# Patient Record
Sex: Female | Born: 1945
Health system: Southern US, Community
[De-identification: ages and names within clinical notes are randomized; demographics above are authoritative.]

## PROBLEM LIST (undated history)

## (undated) DIAGNOSIS — K219 Gastro-esophageal reflux disease without esophagitis: Secondary | ICD-10-CM

## (undated) DIAGNOSIS — G4763 Sleep related bruxism: Secondary | ICD-10-CM

## (undated) DIAGNOSIS — F32A Depression, unspecified: Secondary | ICD-10-CM

## (undated) DIAGNOSIS — G4700 Insomnia, unspecified: Secondary | ICD-10-CM

## (undated) DIAGNOSIS — G4733 Obstructive sleep apnea (adult) (pediatric): Secondary | ICD-10-CM

## (undated) DIAGNOSIS — Z8249 Family history of ischemic heart disease and other diseases of the circulatory system: Secondary | ICD-10-CM

## (undated) DIAGNOSIS — E785 Hyperlipidemia, unspecified: Secondary | ICD-10-CM

## (undated) DIAGNOSIS — C55 Malignant neoplasm of uterus, part unspecified: Secondary | ICD-10-CM

## (undated) DIAGNOSIS — F329 Major depressive disorder, single episode, unspecified: Secondary | ICD-10-CM

## (undated) DIAGNOSIS — M1990 Unspecified osteoarthritis, unspecified site: Secondary | ICD-10-CM

## (undated) DIAGNOSIS — Z90710 Acquired absence of both cervix and uterus: Secondary | ICD-10-CM

## (undated) DIAGNOSIS — J40 Bronchitis, not specified as acute or chronic: Secondary | ICD-10-CM

## (undated) DIAGNOSIS — I10 Essential (primary) hypertension: Secondary | ICD-10-CM

## (undated) DIAGNOSIS — H1010 Acute atopic conjunctivitis, unspecified eye: Secondary | ICD-10-CM

## (undated) HISTORY — DX: Insomnia, unspecified: G47.00

## (undated) HISTORY — PX: ROTATOR CUFF REPAIR: SHX139

## (undated) HISTORY — DX: Hyperlipidemia, unspecified: E78.5

## (undated) HISTORY — DX: Bronchitis, not specified as acute or chronic: J40

## (undated) HISTORY — DX: Acute atopic conjunctivitis, unspecified eye: H10.10

## (undated) HISTORY — DX: Essential (primary) hypertension: I10

## (undated) HISTORY — DX: Acquired absence of both cervix and uterus: Z90.710

## (undated) HISTORY — PX: NASAL SEPTUM SURGERY: SHX37

## (undated) HISTORY — PX: TOTAL ABDOMINAL HYSTERECTOMY: SHX209

## (undated) HISTORY — PX: NM MYOVIEW LTD: HXRAD82

## (undated) HISTORY — DX: Family history of ischemic heart disease and other diseases of the circulatory system: Z82.49

## (undated) HISTORY — DX: Obstructive sleep apnea (adult) (pediatric): G47.33

## (undated) HISTORY — DX: Sleep related bruxism: G47.63

## (undated) HISTORY — PX: DOPPLER ECHOCARDIOGRAPHY: SHX263

## (undated) HISTORY — DX: Malignant neoplasm of uterus, part unspecified: C55

## (undated) HISTORY — DX: Gastro-esophageal reflux disease without esophagitis: K21.9

## (undated) HISTORY — PX: CARPAL TUNNEL RELEASE: SHX101

## (undated) HISTORY — PX: BILATERAL SALPINGOOPHORECTOMY: SHX1223

---

## 1997-10-29 DIAGNOSIS — C4491 Basal cell carcinoma of skin, unspecified: Secondary | ICD-10-CM

## 1997-10-29 HISTORY — DX: Basal cell carcinoma of skin, unspecified: C44.91

## 2001-08-01 ENCOUNTER — Encounter: Admission: RE | Admit: 2001-08-01 | Discharge: 2001-08-01 | Payer: Self-pay | Admitting: Obstetrics and Gynecology

## 2001-08-01 ENCOUNTER — Encounter: Payer: Self-pay | Admitting: Obstetrics and Gynecology

## 2001-08-05 ENCOUNTER — Encounter: Admission: RE | Admit: 2001-08-05 | Discharge: 2001-08-05 | Payer: Self-pay | Admitting: Obstetrics and Gynecology

## 2001-08-05 ENCOUNTER — Encounter: Payer: Self-pay | Admitting: Obstetrics and Gynecology

## 2001-10-16 ENCOUNTER — Encounter (INDEPENDENT_AMBULATORY_CARE_PROVIDER_SITE_OTHER): Payer: Self-pay | Admitting: *Deleted

## 2001-10-16 ENCOUNTER — Ambulatory Visit (HOSPITAL_COMMUNITY): Admission: RE | Admit: 2001-10-16 | Discharge: 2001-10-16 | Payer: Self-pay | Admitting: Gastroenterology

## 2003-04-07 ENCOUNTER — Ambulatory Visit (HOSPITAL_COMMUNITY): Admission: RE | Admit: 2003-04-07 | Discharge: 2003-04-07 | Payer: Self-pay | Admitting: Ophthalmology

## 2003-06-30 ENCOUNTER — Ambulatory Visit (HOSPITAL_COMMUNITY): Admission: RE | Admit: 2003-06-30 | Discharge: 2003-06-30 | Payer: Self-pay | Admitting: Internal Medicine

## 2004-11-02 ENCOUNTER — Ambulatory Visit (HOSPITAL_COMMUNITY): Admission: RE | Admit: 2004-11-02 | Discharge: 2004-11-02 | Payer: Self-pay | Admitting: Gastroenterology

## 2004-11-02 ENCOUNTER — Encounter (INDEPENDENT_AMBULATORY_CARE_PROVIDER_SITE_OTHER): Payer: Self-pay | Admitting: Specialist

## 2005-10-17 ENCOUNTER — Ambulatory Visit: Payer: Self-pay | Admitting: Pulmonary Disease

## 2006-04-06 ENCOUNTER — Ambulatory Visit: Payer: Self-pay | Admitting: Pulmonary Disease

## 2006-07-06 ENCOUNTER — Ambulatory Visit: Payer: Self-pay | Admitting: Pulmonary Disease

## 2006-08-15 ENCOUNTER — Ambulatory Visit: Payer: Self-pay | Admitting: Pulmonary Disease

## 2006-08-23 ENCOUNTER — Ambulatory Visit: Payer: Self-pay | Admitting: Internal Medicine

## 2006-09-26 ENCOUNTER — Ambulatory Visit: Payer: Self-pay | Admitting: Pulmonary Disease

## 2007-02-12 ENCOUNTER — Ambulatory Visit: Payer: Self-pay | Admitting: Pulmonary Disease

## 2007-02-22 ENCOUNTER — Ambulatory Visit: Payer: Self-pay | Admitting: Pulmonary Disease

## 2007-03-01 ENCOUNTER — Ambulatory Visit: Payer: Self-pay | Admitting: Cardiovascular Disease

## 2007-03-27 ENCOUNTER — Ambulatory Visit: Payer: Self-pay | Admitting: Pulmonary Disease

## 2007-05-16 ENCOUNTER — Ambulatory Visit: Payer: Self-pay | Admitting: Internal Medicine

## 2007-07-23 ENCOUNTER — Ambulatory Visit: Payer: Self-pay | Admitting: Internal Medicine

## 2007-11-19 DIAGNOSIS — J45901 Unspecified asthma with (acute) exacerbation: Secondary | ICD-10-CM | POA: Insufficient documentation

## 2007-11-19 DIAGNOSIS — K219 Gastro-esophageal reflux disease without esophagitis: Secondary | ICD-10-CM | POA: Insufficient documentation

## 2007-11-20 ENCOUNTER — Encounter: Payer: Self-pay | Admitting: Internal Medicine

## 2007-11-20 ENCOUNTER — Ambulatory Visit: Payer: Self-pay | Admitting: Internal Medicine

## 2008-05-20 ENCOUNTER — Ambulatory Visit: Payer: Self-pay | Admitting: Internal Medicine

## 2008-05-20 DIAGNOSIS — H1045 Other chronic allergic conjunctivitis: Secondary | ICD-10-CM

## 2009-05-19 ENCOUNTER — Encounter (HOSPITAL_COMMUNITY): Admission: RE | Admit: 2009-05-19 | Discharge: 2009-08-17 | Payer: Self-pay | Admitting: Cardiology

## 2009-05-20 ENCOUNTER — Ambulatory Visit: Payer: Self-pay | Admitting: Internal Medicine

## 2009-05-20 DIAGNOSIS — E1169 Type 2 diabetes mellitus with other specified complication: Secondary | ICD-10-CM | POA: Insufficient documentation

## 2009-05-20 DIAGNOSIS — I10 Essential (primary) hypertension: Secondary | ICD-10-CM

## 2009-05-20 DIAGNOSIS — E785 Hyperlipidemia, unspecified: Secondary | ICD-10-CM

## 2009-08-18 ENCOUNTER — Encounter (HOSPITAL_COMMUNITY): Admission: RE | Admit: 2009-08-18 | Discharge: 2009-11-16 | Payer: Self-pay | Admitting: Cardiology

## 2009-11-17 ENCOUNTER — Encounter (HOSPITAL_COMMUNITY): Admission: RE | Admit: 2009-11-17 | Discharge: 2010-02-15 | Payer: Self-pay | Admitting: Cardiology

## 2010-02-16 ENCOUNTER — Encounter (HOSPITAL_COMMUNITY): Admission: RE | Admit: 2010-02-16 | Discharge: 2010-05-17 | Payer: Self-pay | Admitting: Cardiovascular Disease

## 2010-04-21 ENCOUNTER — Ambulatory Visit: Payer: Self-pay | Admitting: Internal Medicine

## 2010-04-21 DIAGNOSIS — J00 Acute nasopharyngitis [common cold]: Secondary | ICD-10-CM | POA: Insufficient documentation

## 2010-04-26 ENCOUNTER — Telehealth (INDEPENDENT_AMBULATORY_CARE_PROVIDER_SITE_OTHER): Payer: Self-pay | Admitting: *Deleted

## 2010-04-27 ENCOUNTER — Ambulatory Visit: Payer: Self-pay | Admitting: Internal Medicine

## 2010-05-18 ENCOUNTER — Encounter (HOSPITAL_COMMUNITY): Admission: RE | Admit: 2010-05-18 | Discharge: 2010-08-16 | Payer: Self-pay | Admitting: Cardiovascular Disease

## 2010-05-27 ENCOUNTER — Encounter: Admission: RE | Admit: 2010-05-27 | Discharge: 2010-05-27 | Payer: Self-pay | Admitting: Otolaryngology

## 2010-06-13 ENCOUNTER — Ambulatory Visit: Payer: Self-pay | Admitting: Internal Medicine

## 2010-08-18 ENCOUNTER — Encounter (HOSPITAL_COMMUNITY)
Admission: RE | Admit: 2010-08-18 | Discharge: 2010-11-16 | Payer: Self-pay | Source: Home / Self Care | Admitting: Cardiovascular Disease

## 2010-10-11 ENCOUNTER — Ambulatory Visit: Payer: Self-pay | Admitting: Internal Medicine

## 2010-11-17 ENCOUNTER — Encounter (HOSPITAL_COMMUNITY)
Admission: RE | Admit: 2010-11-17 | Discharge: 2010-12-17 | Payer: Self-pay | Source: Home / Self Care | Admitting: Cardiovascular Disease

## 2010-11-25 ENCOUNTER — Ambulatory Visit: Payer: Self-pay | Admitting: Internal Medicine

## 2011-01-17 NOTE — Progress Notes (Signed)
Summary: wants to speak to [facility]  Phone Note Call from Patient Call back at Home Phone [phone] Call back at [phone]   Caller: Patient Call For: young Summary of Call: pt wants to come in for another nasal treatment Initial call taken by: Lacinda Axon,  Apr 26, 2010 12:19 PM  Follow-up for Phone Call        called and spoke with pt.  pt just saw CY on 04-21-2010.  pt states she took last dose of abx today.  pt states she started to feel better yesterday but today symptoms returned. pt c/o increased sob, feeling ti9red, head and chest congestion, coughing up white sputum.  Pt denied f/c/s.  Pt would like to come in for another nasal neb tx.  Please advise.  Thank you.  Aundra Millet Reynolds LPN  Apr 26, 2010 1:04 PM   ALLERGIES:  SULFA   Additional Follow-up for Phone Call Additional follow up Details #1::        Per CDY-ok to come in for nasal tx only; pt aware and will be here Wedensday at 930am.[facility] CMA  Apr 26, 2010 2:07 PM

## 2011-01-17 NOTE — Assessment & Plan Note (Signed)
Summary: rov 4 months///kp   Primary Provider/Referring Provider:  Larina Earthly  CC:  4 month follow up and no complaints .  History of Present Illness: May 22, 2009- Bronchitis,, conjunctivitis  Has done very well over past year. Dr Larae Grooms gave fluticasone which controls her nose. Uses Mucinex to thin mucus. In last 2 weeks has had scratchy eyes and increased head and chest congestion with little discharge. Chest not tight and she has not felt she needed Advair. Asks refill Patanol.  Apr 21, 2010- Bronchitis,, conjunctivitis  Tinnitus, head congestion, chest tightness, no energy, muscles ache some. Some cough. Nasal discharge is cloudy. This has been going on a week. Denies sick exposure. Has a long drive tomorrow and wants to feel better. No Gi upset. Prior to this she says she had done well over the past year, using Patanol eye drops and fluticasone nasal spray.  June 13, 2010- Bronchitis, Rhinits,  onjunctivitis Nurse-CC: 4 month follow up, no complaints.  She is feeling pretty well now. Says Mucinex and Sudafed usually takes care of her. Continues flonase. Patanase works well but too expensive. We discussed otc eye drops. Little or no chest discomfort.  Planning trip to [place] she wanted to discuss- taking daughter to see birth mother.      Preventive Screening-Counseling & Management  Alcohol-Tobacco     Smoking Status: quit     Packs/Day: 1.5     Year Started: 1965     Year Quit: 1980  Current Medications (verified): 1)  Cymbalta 60 Mg  Cpep (Duloxetine Hcl) .... Take 1 Capsule By Mouth Once A Day 2)  Patanol 0.1 %  Soln (Olopatadine Hcl) .[name].. 1 Drop in Each Eye At Bedtime As Needed 3)  Crestor 20 Mg Tabs (Rosuvastatin Calcium) .[name].. 1 Once Daily 4)  Bystolic 10 Mg Tabs (Nebivolol Hcl) .... Take 1 By Mouth Once Daily 5)  Vagifem 25 Mcg Tabs (Estradiol) .... Take  Twice Week 6)  Prilosec Otc 20 Mg Tbec (Omeprazole Magnesium) .[name].. 1 Once Daily 7)  Flonase 50 Mcg/act Susp  (Fluticasone Propionate) .... Once Daily 8)  Losartan Potassium 50 Mg Tabs (Losartan Potassium) .... Take 1 By Mouth Once Daily 9)  Viibryd 40 Mg Tabs (Vilazodone Hcl) .[name].. 1 Once Daily 10)  Losartan Potassium 100 Mg Tabs (Losartan Potassium) .... 1/2 Two Times A Day  Allergies (verified): 1)  ! Sulfa  Past History:  Past Medical History: Last updated: 2009/05/22 ALLERGIC CONJUNCTIVITIS (ICD-372.14) ESOPHAGEAL REFLUX (ICD-530.81) BRONCHITIS (ICD-490) Hx uterine cancer- hyusterectomy Hyperlipidemia Hypertension  Past Surgical History: Last updated: 05/22/2009 Nasal septoplasty T A H and B S O carpal tunnel bilaterally  Family History: Last updated: 2008-05-22 mother died at age 35 from pulmonary problems father died at age 7 from heart attack 2 siblings 1 sister alive age 71--hx of pulmonary problems 1 brother alive age 104   Social History: Last updated: 05-22-2008 single 1 child quit smoking 30 years ago no etoh use  Risk Factors: Smoking Status: quit (10/11/2010) Packs/Day: 1.5 (10/11/2010)  Social History: Smoking Status:  quit Packs/Day:  1.5  Review of Systems      See HPI       The patient complains of nasal congestion/difficulty breathing through nose and sneezing.  The patient denies shortness of breath with activity, shortness of breath at rest, productive cough, non-productive cough, coughing up blood, chest pain, irregular heartbeats, acid heartburn, indigestion, loss of appetite, weight change, abdominal pain, difficulty swallowing, sore throat, tooth/dental problems, headaches, itching, ear ache,  rash, change in color of mucus, and fever.    Vital Signs:  Patient profile:   65 year old female Height:      61 inches Weight:      159.2 pounds BMI:     30.19 O2 Sat:      99 % on Room air Pulse rate:   54 / minute BP sitting:   162 / 80  (left arm)  Vitals Entered By: Renold Genta RCP, LPN (October 11, 2010 9:00 AM)  O2 Flow:  Room air CC:  4 month follow up, no complaints  Is Patient Diabetic? No Comments Medications reviewed with patient Renold Genta RCP, LPN  October 11, 2010 9:00 AM    Physical Exam  Additional Exam:  General: A/Ox3; pleasant and cooperative, NAD, SKIN: no rash, lesions NODES: no lymphadenopathy HEENT: [place]/AT, EOM- WNL, Conjuctivae- clear, PERRLA, TM-WNL, Nose- stuffy, Throat- clear and wnl, Mallampati III NECK: Supple w/ fair ROM, JVD- none, normal carotid impulses w/o bruits Thyroid-  CHEST: Clear to P&A HEART: RRR, no m/g/r heard ABDOMEN: mild overweight HYQ:MVHQ, nl pulses, no edema NEURO: Grossly intact to observation      Impression & Recommendations:  Problem # 1:  RHINOCONJUNCTIVITIS (ICD-460)  Good symptomatic control for this time of year. We can leave her with current meds.  We discussed otc allergy eyedrops that will be easier to afford.   Orders: Est. Patient Level III (46962)  Problem # 2:  BRONCHITIS (ICD-490) Good control currently with no intervention to offer.   Medications Added to Medication List This Visit: 1)  Cymbalta 60 Mg Cpep (Duloxetine hcl) .... Take 1 capsule by mouth once a day 2)  Crestor 20 Mg Tabs (Rosuvastatin calcium) .[name].. 1 once daily 3)  Prilosec Otc 20 Mg Tbec (Omeprazole magnesium) .[name].. 1 once daily 4)  Viibryd 40 Mg Tabs (Vilazodone hcl) .[name].. 1 once daily 5)  Losartan Potassium 100 Mg Tabs (Losartan potassium) .... 1/2 two times a day  Patient Instructions: 1)  Please schedule a follow-up appointment in 1 year. 2)  Please call earlier if needed. 3)  Consider otc eye drops. Visine AC, [place] A, [place].

## 2011-01-17 NOTE — Assessment & Plan Note (Signed)
Summary: HEADACHE, SINUS CONGESTION, ? Bronchitis/apc   Primary Provider/Referring Provider:  Larina Earthly  CC:  Accute Visit-head congestion x 3 days/chest congestion;headache-voice going lower; pressure in head.[name]  History of Present Illness: ESOPHAGEAL REFLUX (ICD-530.81) BRONCHITIS (ICD-2) Tthis 65 year old woman returns for 6 month follow-up.  She has had recurrent bronchitis and thinks she has an acute bronchitis now. She has noted increased chest congestion, and some cough with little sputum.  No fever or significant dyspnea. Spring was good.  She does not feel she needs a rescue inhaler.  Patanol eye drops had been needed very rarely.  She had quit smoking 30 years ago.  She is aware of GERD, but feels it is controlled. Denies headache, sinus drainage, sneezing chest pain, dyspnea, n/v/d, weight loss, fever, edema.   05-27-09- Bronchitis, fluticasone, conjunctivitis  Has done very well over past year. Dr Larae Grooms gave fluticasone which controls her nose. Uses Mucinex to thin mucus. In last 2 weeks has had scratchy eyes and increased head and chest congestion with little discharge. Chest not tight and she has not felt she needed Advair. Asks refill Patanol.  Apr 21, 2010-  Tinnitus, head congestion, chest tightness, no energy, muscles ache some. Some cough. Nasal discharge is cloudy. This has been going on a week. Denies sick exposure. Has a long drive tomorrow and wants to feel better. No Gi upset. Prior to this she says she had done well over the past year, using Patanol eye drops and fluticasone nasal spray.  June 13, 2010- Bronchitis,  conjunctivitis After nasal neb and depo she felt well until she got tired x last few days and now again feels pressure fullness in head and chest with voice lower, frontal headache.    Preventive Screening-Counseling & Management  Alcohol-Tobacco     Smoking Status: quit > 6 months  Current Medications (verified): 1)  Cymbalta 60 Mg  Cpep  (Duloxetine Hcl) .... Take 1 Capsule By Mouth Once A Day 2)  Patanol 0.1 %  Soln (Olopatadine Hcl) .[name].. 1 Drop in Each Eye At Bedtime As Needed 3)  Crestor 10 Mg  Tabs (Rosuvastatin Calcium) .... Take 1 By Mouth Once Daily 4)  Bystolic 10 Mg Tabs (Nebivolol Hcl) .... Take 1 By Mouth Once Daily 5)  Vagifem 25 Mcg Tabs (Estradiol) .... Take  Twice Week 6)  Prilosec ?mg .... Take 1 By Mouth 7)  Flonase 50 Mcg/act Susp (Fluticasone Propionate) .... Once Daily 8)  Losartan Potassium 50 Mg Tabs (Losartan Potassium) .... Take 1 By Mouth Once Daily  Allergies (verified): 1)  ! Sulfa  Past History:  Past Medical History: Last updated: 27-May-2009 ALLERGIC CONJUNCTIVITIS (ICD-372.14) ESOPHAGEAL REFLUX (ICD-530.81) BRONCHITIS (ICD-490) Hx uterine cancer- hyusterectomy Hyperlipidemia Hypertension  Past Surgical History: Last updated: May 27, 2009 Nasal septoplasty T A H and B S O carpal tunnel bilaterally  Family History: Last updated: May 27, 2008 mother died at age 86 from pulmonary problems father died at age 67 from heart attack 2 siblings 1 sister alive age 67--hx of pulmonary problems 1 brother alive age 62   Social History: Last updated: May 27, 2008 single 1 child quit smoking 30 years ago no etoh use  Risk Factors: Smoking Status: quit > 6 months (06/13/2010)  Social History: Smoking Status:  quit > 6 months  Review of Systems      See HPI       The patient complains of headaches, nasal congestion/difficulty breathing through nose, and sneezing.  The patient denies shortness of breath with activity, shortness of  breath at rest, productive cough, non-productive cough, coughing up blood, chest pain, irregular heartbeats, acid heartburn, indigestion, loss of appetite, weight change, abdominal pain, difficulty swallowing, sore throat, and tooth/dental problems.    Vital Signs:  Patient profile:   65 year old female Height:      61 inches Weight:      150 pounds BMI:      28.44 O2 Sat:      97 % on Room air Pulse rate:   71 / minute BP sitting:   118 / 74  (left arm) Cuff size:   regular  Vitals Entered By: Reynaldo Minium CMA (June 13, 2010 10:54 AM)  O2 Flow:  Room air  Physical Exam  Additional Exam:  General: A/Ox3; pleasant and cooperative, NAD, SKIN: no rash, lesions NODES: no lymphadenopathy HEENT: [place]/AT, EOM- WNL, Conjuctivae- clear, PERRLA, TM-WNL, Nose- stuffy, Throat- clear and wnl, Mallampati IV NECK: Supple w/ fair ROM, JVD- none, normal carotid impulses w/o bruits Thyroid-  CHEST: Clear to P&A HEART: RRR, no m/g/r heard ABDOMEN:  EAV:WUJW, nl pulses, no edema NEURO: Grossly intact to observation      Impression & Recommendations:  Problem # 1:  RHINOCONJUNCTIVITIS (ICD-460)  Mucosa is more pale than expected and there may be an allergy component. For now I will  try a neb/ depo at lower dose, then see if she can maintian with Sudafed-PE and her flonase;.  Problem # 2:  BRONCHITIS (ICD-490)  Minimal if any active airway irritation is apparent on exam.  Other Orders: Est. Patient Level III (11914) Admin of Therapeutic Inj  intramuscular or subcutaneous (78295) Depo- Medrol 40mg  (J1030) Nebulizer Tx (62130)  Patient Instructions: 1)  Please schedule a follow-up appointment in 4 months. 2)  Try adding otc deongestant Sudafed-PE whenever your feel stuffy in the head. You can take it alone or with Mucinex. 3)  neb nasal neo 4)  depo 40     Medication Administration  Injection # 1:    Medication: Depo- Medrol 40mg     Diagnosis: RHINOCONJUNCTIVITIS (ICD-460)    Route: SQ    Site: LUOQ gluteus    Exp Date: 03/2013    Lot #: [id number]    Mfr: Pharmacia    Patient tolerated injection without complications    Given by: Reynaldo Minium CMA (June 13, 2010 12:05 PM)  Medication # 1:    Medication: EMR miscellaneous medications    Diagnosis: RHINOCONJUNCTIVITIS (ICD-460)    Dose: 3drops    Route: intranasal    Exp Date:  09/2011    Lot #: [id number]    Mfr: bayer    Comments: Neo-Synephrine    Patient tolerated medication without complications    Given by: Reynaldo Minium CMA (June 13, 2010 12:07 PM)  Orders Added: 1)  Est. Patient Level III [96295] 2)  Admin of Therapeutic Inj  intramuscular or subcutaneous [96372] 3)  Depo- Medrol 40mg  [J1030] 4)  Nebulizer Tx [28413]

## 2011-01-17 NOTE — Assessment & Plan Note (Signed)
Summary: cough,congestion/apc   Primary Provider/Referring Provider:  Larina Earthly  CC:  Acute Visit.  Pt c/o ringing in ears, chest tightness, head and chest congestion, prod cough with white mucus, and body aches and fatigue x 1 wk.  Denies f/c/s.  [name]  History of Present Illness: History of Present Illness: ESOPHAGEAL REFLUX (ICD-530.81) BRONCHITIS (ICD-98) Tthis 65 year old woman returns for 6 month follow-up.  She has had recurrent bronchitis and thinks she has an acute bronchitis now. She has noted increased chest congestion, and some cough with little sputum.  No fever or significant dyspnea. Spring was good.  She does not feel she needs a rescue inhaler.  Patanol eye drops had been needed very rarely.  She had quit smoking 30 years ago.  She is aware of GERD, but feels it is controlled. Denies headache, sinus drainage, sneezing chest pain, dyspnea, n/v/d, weight loss, fever, edema.   2009/06/01- Bronchitis, fluticasone, conjunctivitis  Has done very well over past year. Dr Larae Grooms gave fluticasone which controls her nose. Uses Mucinex to thin mucus. In last 2 weeks has had scratchy eyes and increased head and chest congestion with little discharge. Chest not tight and she has not felt she needed Advair. Asks refill Patanol.  Apr 21, 2010-  Tinnitus, head congestion, chest tightness, no energy, muscles ache some. Some cough. Nasal discharge is cloudy. This has been going on a week. Denies sick exposure. Has a long drive tomorrow and wants to feel better. No Gi upset. Prior to this she says she had done well over the past year, using Patanol eye drops and fluticasone nasal spray.  Current Medications (verified): 1)  Cymbalta 60 Mg  Cpep (Duloxetine Hcl) .... Take 1 Capsule By Mouth Once A Day 2)  Patanol 0.1 %  Soln (Olopatadine Hcl) .[name].. 1 Drop in Each Eye At Bedtime As Needed 3)  Crestor 10 Mg  Tabs (Rosuvastatin Calcium) .... Take 1 By Mouth Once Daily 4)  Bystolic 10 Mg Tabs (Nebivolol Hcl)  .... Take 1 By Mouth Once Daily 5)  Vagifem 25 Mcg Tabs (Estradiol) .... Take  Twice Week 6)  Prilosec ?mg .... Take 1 By Mouth 7)  Flonase 50 Mcg/act Susp (Fluticasone Propionate) .... Once Daily  Allergies (verified): 1)  ! Sulfa  Past History:  Past Medical History: Last updated: June 01, 2009 ALLERGIC CONJUNCTIVITIS (ICD-372.14) ESOPHAGEAL REFLUX (ICD-530.81) BRONCHITIS (ICD-490) Hx uterine cancer- hyusterectomy Hyperlipidemia Hypertension  Past Surgical History: Last updated: 06/01/2009 Nasal septoplasty T A H and B S O carpal tunnel bilaterally  Family History: Last updated: Jun 01, 2008 mother died at age 23 from pulmonary problems father died at age 65 from heart attack 2 siblings 1 sister alive age 65--hx of pulmonary problems 1 brother alive age 73   Social History: Last updated: 2008/06/01 single 1 child quit smoking 30 years ago no etoh use  Risk Factors: Smoking Status: quit (11/19/2007)  Review of Systems      See HPI  The patient denies anorexia, fever, weight loss, weight gain, vision loss, decreased hearing, hoarseness, chest pain, syncope, dyspnea on exertion, peripheral edema, prolonged cough, headaches, hemoptysis, abdominal pain, and severe indigestion/heartburn.    Vital Signs:  Patient profile:   65 year old female Height:      61 inches Weight:      151.31 pounds BMI:     28.69 O2 Sat:      93 % on Room air Pulse rate:   65 / minute BP sitting:   136 / 70  (  right arm) Cuff size:   regular  Vitals Entered By: Gweneth Dimitri RN (Apr 21, 2010 3:22 PM)  O2 Flow:  Room air CC: Acute Visit.  Pt c/o ringing in ears, chest tightness, head and chest congestion, prod cough with white mucus, body aches and fatigue x 1 wk.  Denies f/c/s.   Comments Medications reviewed with patient Daytime contact number verified with patient. Gweneth Dimitri RN  Apr 21, 2010 3:22 PM    Physical Exam  Additional Exam:  General: A/Ox3; pleasant and  cooperative, NAD, SKIN: no rash, lesions NODES: no lymphadenopathy HEENT: [place]/AT, EOM- WNL, Conjuctivae- clear, PERRLA, TM-WNL, Nose- stuffy, Throat- clear and wnl, Mallampati IV NECK: Supple w/ fair ROM, JVD- none, normal carotid impulses w/o bruits Thyroid-  CHEST: Clear to P&A HEART: RRR, no m/g/r heard ABDOMEN:  XBJ:YNWG, nl pulses, no edema NEURO: Grossly intact to observation      Impression & Recommendations:  Problem # 1:  RHINOCONJUNCTIVITIS (ICD-460) Rhinitis and tracheitis. Infection favored by abrupt onset, but she isn't aware of any exposure. Grass pollens have just begun climbing so this might possibly be an allergy problem. Since she needs to travel, we will give Z pak to hold and treat today with neb and depo. She has mucinex.  Medications Added to Medication List This Visit: 1)  Prilosec ?mg  .... Take 1 by mouth 2)  Flonase 50 Mcg/act Susp (Fluticasone propionate) .... Once daily 3)  Zithromax Z-pak 250 Mg Tabs (Azithromycin) .... 2 today then one daily for infection  Other Orders: Est. Patient Level III (95621) Prescription Created Electronically [phone]) Admin of Therapeutic Inj  intramuscular or subcutaneous (78469) Depo- Medrol 80mg  (J1040) Nebulizer Tx (62952)  Patient Instructions: 1)  Please schedule a follow-up appointment in 1 year. 2)  Neb nasal neo 3)  depo 80 4)  Script for z pak sent to drug store Prescriptions: ZITHROMAX Z-PAK 250 MG TABS (AZITHROMYCIN) 2 today then one daily for infection  #1 pak x 0   Entered and Authorized by:   Waymon Budge MD   Signed by:   Waymon Budge MD on 04/21/2010   Method used:   Electronically to        [facility]. [phone]* (retail)       [phone] [address].       [place], [place]  [address]       Ph: [phone]       Fax: [phone]   RxID:   [id number]    Immunization History:  Influenza Immunization History:    Influenza:  historical  (09/17/2009)  Pneumovax Immunization History:    Pneumovax:  historical (09/17/2009)    Medication Administration  Injection # 1:    Medication: Depo- Medrol 80mg     Diagnosis: RHINOCONJUNCTIVITIS (ICD-460)    Route: SQ    Site: RUOQ gluteus    Exp Date: 10/2010    Lot #: [id number]    Mfr: Pharmacia    Patient tolerated injection without complications    Given by: Reynaldo Minium CMA (Apr 21, 2010 4:18 PM)  Medication # 1:    Medication: EMR miscellaneous medications    Diagnosis: RHINOCONJUNCTIVITIS (ICD-460)    Dose: 3 drops    Route: intranasal    Exp Date: 09/2011    Lot #: [id number]    Mfr: bayer    Comments: Neo-Synephrine    Patient tolerated medication without complications    Given  by: Reynaldo Minium CMA (Apr 21, 2010 4:18 PM)  Orders Added: 1)  Est. Patient Level III [99213] 2)  Prescription Created Electronically [phone] 3)  Admin of Therapeutic Inj  intramuscular or subcutaneous [96372] 4)  Depo- Medrol 80mg  [J1040] 5)  Nebulizer Tx [60454]

## 2011-01-17 NOTE — Assessment & Plan Note (Signed)
Summary: nasal tx only/ok per CDY/kcw   CC:  Requests nasal tx-ok per CDY..  Current Medications (verified): 1)  Cymbalta 60 Mg  Cpep (Duloxetine Hcl) .... Take 1 Capsule By Mouth Once A Day 2)  Patanol 0.1 %  Soln (Olopatadine Hcl) .[name].. 1 Drop in Each Eye At Bedtime As Needed 3)  Crestor 10 Mg  Tabs (Rosuvastatin Calcium) .... Take 1 By Mouth Once Daily 4)  Bystolic 10 Mg Tabs (Nebivolol Hcl) .... Take 1 By Mouth Once Daily 5)  Vagifem 25 Mcg Tabs (Estradiol) .... Take  Twice Week 6)  Prilosec ?mg .... Take 1 By Mouth 7)  Flonase 50 Mcg/act Susp (Fluticasone Propionate) .... Once Daily 8)  Losartan Potassium 50 Mg Tabs (Losartan Potassium) .... Take 1 By Mouth Once Daily  Allergies (verified): 1)  ! Sulfa  Vital Signs:  Patient profile:   65 year old female Height:      61 inches Weight:      151 pounds BMI:     28.63 O2 Sat:      95 % on Room air Pulse rate:   59 / minute BP sitting:   156 / 78  (left arm) Cuff size:   regular  Vitals Entered By: Reynaldo Minium CMA (Apr 27, 2010 9:22 AM)  O2 Flow:  Room air   Medications Added to Medication List This Visit: 1)  Losartan Potassium 50 Mg Tabs (Losartan potassium) .... Take 1 by mouth once daily  Other Orders: Est. Patient Level I (19147) Nebulizer Tx (82956)  Patient Instructions: 1)  Please keep your follow up appt. 2)  If you are not better or need [place] before your next appt please call our office.      Medication Administration  Medication # 1:    Medication: EMR miscellaneous medications    Diagnosis: RHINOCONJUNCTIVITIS (ICD-460)    Dose: 3 drops    Route: intranasal    Exp Date: 09/2011    Lot #: [id number]    Mfr: Bayer    Comments: Neo-Synephrine    Patient tolerated medication without complications    Given by: Reynaldo Minium CMA (Apr 27, 2010 9:24 AM)  Orders Added: 1)  Est. Patient Level I [78469] 2)  Nebulizer Tx [phone]

## 2011-01-19 NOTE — Assessment & Plan Note (Signed)
Summary: nasal congestion/sinus trouble/kcw   Primary Provider/Referring Provider:  Larina Earthly  CC:  Acute visit-both ears causing pressure; cough-productive-yellow; soreness in chest and sore throat.[name]  History of Present Illness:  Apr 21, 2010- Bronchitis,, conjunctivitis  Tinnitus, head congestion, chest tightness, no energy, muscles ache some. Some cough. Nasal discharge is cloudy. This has been going on a week. Denies sick exposure. Has a long drive tomorrow and wants to feel better. No Gi upset. Prior to this she says she had done well over the past year, using Patanol eye drops and fluticasone nasal spray.  June 13, 2010- Bronchitis, Rhinits,  onjunctivitis Nurse-CC: 4 month follow up, no complaints.  She is feeling pretty well now. Says Mucinex and Sudafed usually takes care of her. Continues flonase. Patanase works well but too expensive. We discussed otc eye drops. Little or no chest discomfort.  Planning trip to [place] she wanted to discuss- taking daughter to see birth mother.   November 25, 2010 Bronchitis, Rhinits,  conjunctivitis Nurse-CC: Acute visit-both ears causing pressure; cough-productive-yellow; soreness in chest and sore throat. (Daughter is doing better with her asthma).  Sore throat, chest feels hot and tight. Bilateral ear pressure. Not feeling well for 6 days and began mucinex. No fever. Sputum yelllow.       Preventive Screening-Counseling & Management  Alcohol-Tobacco     Smoking Status: quit     Packs/Day: 1.5     Year Started: 1965     Year Quit: 1980  Current Medications (verified): 1)  Cymbalta 60 Mg  Cpep (Duloxetine Hcl) .... Take 1 Capsule By Mouth Once A Day 2)  Patanol 0.1 %  Soln (Olopatadine Hcl) .[name].. 1 Drop in Each Eye At Bedtime As Needed 3)  Crestor 20 Mg Tabs (Rosuvastatin Calcium) .[name].. 1 Once Daily 4)  Bystolic 10 Mg Tabs (Nebivolol Hcl) .... Take 1 By Mouth Once Daily 5)  Vagifem 25 Mcg Tabs (Estradiol) .... Take  Twice Week 6)   Prilosec Otc 20 Mg Tbec (Omeprazole Magnesium) .[name].. 1 Once Daily 7)  Flonase 50 Mcg/act Susp (Fluticasone Propionate) .... Once Daily 8)  Losartan Potassium 50 Mg Tabs (Losartan Potassium) .... Take 1 By Mouth Once Daily 9)  Viibryd 40 Mg Tabs (Vilazodone Hcl) .[name].. 1 Once Daily 10)  Losartan Potassium 100 Mg Tabs (Losartan Potassium) .... 1/2 Two Times A Day  Allergies (verified): 1)  ! Sulfa  Past History:  Past Medical History: Last updated: 07-Jun-2009 ALLERGIC CONJUNCTIVITIS (ICD-372.14) ESOPHAGEAL REFLUX (ICD-530.81) BRONCHITIS (ICD-490) Hx uterine cancer- hyusterectomy Hyperlipidemia Hypertension  Past Surgical History: Last updated: 06/07/09 Nasal septoplasty T A H and B S O carpal tunnel bilaterally  Family History: Last updated: 06/07/2008 mother died at age 67 from pulmonary problems father died at age 31 from heart attack 2 siblings 1 sister alive age 47--hx of pulmonary problems 1 brother alive age 49   Social History: Last updated: 2008/06/07 single 1 child quit smoking 30 years ago no etoh use  Risk Factors: Smoking Status: quit (11/25/2010) Packs/Day: 1.5 (11/25/2010)  Review of Systems      See HPI       The patient complains of non-productive cough, sore throat, and nasal congestion/difficulty breathing through nose.  The patient denies shortness of breath with activity, shortness of breath at rest, productive cough, coughing up blood, chest pain, irregular heartbeats, acid heartburn, indigestion, loss of appetite, weight change, abdominal pain, difficulty swallowing, tooth/dental problems, headaches, and sneezing.    Vital Signs:  Patient profile:  65 year old female Height:      61 inches Weight:      157.38 pounds BMI:     29.84 O2 Sat:      96 % on Room air Pulse rate:   74 / minute BP sitting:   124 / 82  (left arm) Cuff size:   regular  Vitals Entered By: Reynaldo Minium CMA (November 25, 2010 1:36 PM)  O2 Flow:  Room air CC:  Acute visit-both ears causing pressure; cough-productive-yellow; soreness in chest and sore throat.   Physical Exam  Additional Exam:  General: A/Ox3; pleasant and cooperative, NAD, SKIN: no rash, lesions NODES: no lymphadenopathy HEENT: [place]/AT, EOM- WNL, Conjuctivae- clear, PERRLA, TM-WNL, Nose- stuffy, Throat- clear and wnl, Mallampati III NECK: Supple w/ fair ROM, JVD- none, normal carotid impulses w/o bruits Thyroid-  CHEST: Clear to P&A HEART: RRR, no m/g/r heard ABDOMEN: mild overweight VOZ:DGUY, nl pulses, no edema NEURO: Grossly intact to observation      Impression & Recommendations:  Problem # 1:  BRONCHITIS (ICD-490)  URI with bronchtiis. Not responding yet to Phenylephrine or Mucinex. We agreed she didn't need neb or depo. She will hydrate, continue symptomatic therapy, and hold a Zpak against deterioration.   Her updated medication list for this problem includes:    Zithromax Z-pak 250 Mg Tabs (Azithromycin) .[name]... 2 today then one daily  Problem # 2:  ESOPHAGEAL REFLUX (ICD-530.81)  I don't think she is describing a reflux related upper airway problem this time. We discussed reflux precautions. Her updated medication list for this problem includes:    Prilosec Otc 20 Mg Tbec (Omeprazole magnesium) .[name]... 1 once daily  Medications Added to Medication List This Visit: 1)  Zithromax Z-pak 250 Mg Tabs (Azithromycin) .... 2 today then one daily  Other Orders: Est. Patient Level III (40347)  Patient Instructions: 1)  Please schedule a follow-up appointment in 6 months. 2)  script sent for Z pak 3)  Continue to get enough fluids and rest, use Mucinex and phenylephrine as needed.  Prescriptions: ZITHROMAX Z-PAK 250 MG TABS (AZITHROMYCIN) 2 today then one daily  #1 pak x 0   Entered and Authorized by:   Waymon Budge MD   Signed by:   Waymon Budge MD on 11/25/2010   Method used:   Electronically to        [facility]. [phone]* (retail)        [phone]       [place], [place]  [address]       Ph: [phone]       Fax: [phone]   RxID:   [id number]    Immunization History:  Influenza Immunization History:    Influenza:  historical (10/06/2010)

## 2011-03-20 ENCOUNTER — Other Ambulatory Visit: Payer: Self-pay | Admitting: Internal Medicine

## 2011-05-02 NOTE — Assessment & Plan Note (Signed)
[facility]                             PULMONARY OFFICE NOTE   NAME:Amber Roman, Amber Roman                       MRN:          [id number]  DATE:07/23/2007                            DOB:          Aug 28, 1946    PROBLEMS:  1. Episodic allergic bronchitis.  2. Esophageal reflux.   HISTORY:  She traveled earlier this summer with no problems. Usually  August and September are her worst months. She carried a Z-PAK, but did  not need to use it and says that she is not having any routine cough. At  night, very recently, she noticed some cough and nose blowing. She is  pending an appointment with Dr. Langston Reusing for ENT evaluation.   MEDICATIONS:  List is reviewed and unchanged.   OBJECTIVE:  Weight 157 pounds, blood pressure 122/70, pulse 76, room air  saturation 94%. Her nose does not seem obstructed. Pharynx is clear.  Chest is clear. Heart sounds normal. No adenopathy.   IMPRESSION:  1. Recurrent asthmatic bronchitis, probably viral and allergic      components.  2. Recurrent rhinitis which may also have an allergic component.  3. Polyp right maxillary sinus on CT pending evaluation with Dr. Langston Reusing.   PLAN:  Saline nasal lavage with information given. Continue current  therapy including Advair and Patanol eye drops. Schedule return 4  months, earlier p.r.n. Consider skin test p.r.n.   ADDENDUM   Pulmonary function test results are available now from 07/23/2007 showing  mild obstructive airways disease only in small airway flows where there  was significant response to bronchodilator. Increased residual volume  indicates some air trapping. Diffusion was mildly reduced at 74% of  predicted.   PLAN:  Keep appointment in 4 months, earlier p.r.n. Also note that chest  x-ray report from 05/16/2007 describing less prominent scarring in the  lingula and chronic interstitial coarsening.     Clinton D. Maple Hudson, MD, Tonny Bollman, FACP  Electronically  Signed    CDY/MedQ  DD: 07/28/2007  DT: 07/29/2007  Job #: [id number]   cc:   Larina Earthly, M.D.

## 2011-05-02 NOTE — Assessment & Plan Note (Signed)
[facility]                             PULMONARY OFFICE NOTE   TIEARA, FLITTON                       MRN:          [id number]  DATE:05/16/2007                            DOB:          1946-01-30    PROBLEM:  A 65 year old woman previously followed by Dr. Jayme Cloud with  persistent bronchitis and chest congestion symptoms.  They have noticed  that spring pollen season seems to make it worse and they are asking for  allergy evaluation.   HISTORY:  She says seasonal pollens, spring more than fall, and getting  fatigued, tend to set her up for chest congestion and cough.  Worst  season is mid March through the summer until the weather begins to cool.  She recognizes little problem from viral pattern colds.  She never has  much head congestion, itching, or drainage of eyes or nose.  Four years  ago, skin testing by Jessica Priest, M.D. was positive for dust.  I am  not sure if anything else reacted.  She was not tried on allergy shots  at that time and she does not recognize much trouble when she dusts in  her home.  She feels she is bothered more by air quality issues than any  other single specific factor that she can identify.  She has been told  remotely that she had some asthma.  She describes distinct sick episodes  and is quite well in between.  Regular use of nasal saline seems to  reduce the frequency from every 2-3 months now to about twice a year.  She has not needed her rescue albuterol inhaler.  A simple office  spirometry in 2006 was normal with an FEV1 of 109%.  CT of the sinuses  in March of this year showed focal mucosal thickening or polyp or  retention cysts in the floor of the right maxillary sinus, but otherwise  the anatomy was unremarkable.  A chest x-ray in 2005 described scarring  or atelectasis in the lingula.   MEDICATIONS:  1. Hycosamine 0.375 mg b.i.d.  2. Cymbalta 60 mg.  3. Patanol 0.1% eye drops b.i.d. p.r.n.  4.  Abilify 2 mg.  5. Lipitor 20 mg.  6. Metoprolol 50 mg b.i.d.  7. Advair 100/50.  8. Lorazepam 0.5 mg t.i.d. p.r.n.   DRUG INTOLERANCE:  SULFA.   REVIEW OF SYSTEMS:  Shortness of breath with activity, nonproductive  cough, occasional indigestion, anxiety and depression.  She has a mild  sense now that her chest is congested and she is fatigued.  Although,  she does not think that she could nap if she had the opportunity.   PAST MEDICAL HISTORY:  Uterine cancer with hysterectomy, recurrent  episodes of bronchitis but no history of pneumonia.  She still has  tonsils, but had a septoplasty.  No problems with skin rashes, food, or  cosmetic intolerance, or unusual reactions to insects.  No problems with  latex, contrast dye, or aspirin.   SOCIAL HISTORY:  She quit smoking in 1978.  No longer drinks alcohol.  She is single with children.  Working as a Investment banker, corporate.  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  She has traveled to  [place] recently.   FAMILY HISTORY:  Mother with emphysema, father with heart disease.  Others with cancer.  Nobody with significant allergy or asthma  complaints.   OBJECTIVE:  VITAL SIGNS:  Weight 160 pounds, blood pressure 124/72,  pulse 81, room air saturation 95%.  GENERAL:  She is alert and seems comfortable, somewhat overweight.  SKIN:  Without rash.  I find no adenopathy.  HEENT:  Nose and throat are clear.  Conjunctivae are clear.  Voice  quality is normal.  There is no stridor.  No neck vein distention.  CHEST:  Clear to P&A.  HEART:  Sounds regular without murmur or gallop.  There is no peripheral  edema, cyanosis, or clubbing.   IMPRESSION:  History of episodic bronchitis that really sounds more like  occasional viral disease at this time.  She certainly does not offer  much history to suggest an allergic rhinitis, but she may have some   reflux.   PLAN:  1. At her request, I provided a Z-Pak for her to carry with her on an      upcoming trip with discussion.  2. Chest x-ray which returns now dated May 16, 2007, describing less      prominent scarring in the lingula, chronic interstitial coarsening      which is mild, no significant changes otherwise compared with      September of 2005.  Pulmonary function testing is scheduled.  I      have emphasized reflux precautions.  She will return after her      upcoming trip, in about 2 months, earlier p.r.n.     Clinton D. Maple Hudson, MD, Tonny Bollman, FACP  Electronically Signed    CDY/MedQ  DD: 05/19/2007  DT: 05/19/2007  Job #: [id number]   cc:   Gailen Shelter, MD  Lucky Cowboy, MD  Larina Earthly, M.D.

## 2011-05-02 NOTE — Assessment & Plan Note (Signed)
[facility]                             PULMONARY OFFICE NOTE   NAME:[name], [name]                       MRN:          [id number]  DATE:11/20/2007                            DOB:          1946/01/24    PROBLEM:  1. Episodic allergic bronchitis.  2. Esophageal reflux.   HISTORY:  She has liked Neti pot which has helped keep her nasal  congestion clear and today she feels well.  She likes the cooler  weather.  Had the heating ducts cleaned in her home.  Had flu shot.  I  had previously given her environmental precautions for dust mite and  allergen avoidance.  She had seen Dr. Lucky Cowboy who said it was not  necessary to operate on her nose for the abnormal CT scan report from  March 14 describing focal mucosal thickening vs. polyp vs. retention  cyst in the floor of the right maxillary sinus.  She is using a nasal  spray which we think, from her description, may be Nasonex.  She has not  been needing Advair at all.   MEDICATION:  1. Hyoscyamine one b.i.d.  2. Cymbalta 60 mg.  3. Patanol eye drops.  4. Abilify 2 mg.  5. Lipitor 20 mg.  6. Metoprolol 50 mg b.i.d.  7. Advair 100/50.  8. Lorazepam 0.5 mg t.i.d. p.r.n.   DRUG INTOLERANT:  Sulfa.   OBJECTIVE:  BP 120/68, room air saturation 96%, pulse regular 83, weight  162 pounds.  Turbinates are edematous with no mucus visible, no polyps,  no postnasal drainage or pharyngeal irritation, conjunctivae look clear.  There is no stridor or neck vein distention.  HEART SOUNDS:  Regular without murmur or gallop.   IMPRESSION:  Stable mild asthmatic bronchitis and rhinitis.   PLAN:  Schedule return in 6 months, earlier p.r.n.     Clinton D. Maple Hudson, MD, Tonny Bollman, FACP  Electronically Signed    CDY/MedQ  DD: 11/23/2007  DT: 11/24/2007  Job #: [id number]   cc:   Larina Earthly, M.D.

## 2011-05-05 NOTE — Assessment & Plan Note (Signed)
[facility]                               PULMONARY OFFICE NOTE   NAME:NELSONArlanda, Amber Roman                       MRN:          [id number]  DATE:09/26/2006                            DOB:          01-29-1946    The patient is a 65 year old white female who has asthmatic bronchitis,  presents for followup.  She actually has been doing relatively well.  Her  only complaint is that of itchy, watery eyes, for which Patanol helps.  She  presents today with no acute complaint.  She states that she is doing well  on her current regimen.  She does request flu vaccine.   CURRENT MEDICATIONS:  As noted on the intake sheet.  These have been  reviewed and are accurate.   PHYSICAL EXAM:  VITAL SIGNS:  As noted.  Oxygen is 94% on room air.  GENERAL:  This is a well-developed, mildly obese white female who is in no  acute distress.  HEENT:  Remarkable for mild allergic conjunctivitis.  NECK:  Supple.  No adenopathy noted.  No JVD.  LUNGS:  Clear.  CARDIAC:  Regular rate and rhythm.  No rub, murmur, or gallop heard.  EXTREMITIES:  No cyanosis, no clubbing, no edema noted.   IMPRESSION:  1. Chronic obstructive pulmonary disease with asthmatic bronchitic      component.  The patient is well-compensated in this regard.  2. Allergic rhinitis and conjunctivitis.   PLAN:  1. The patient to continue Patanol drops.  I did provide her a      prescription for this.  2. Flu vaccine was provided to her today for health maintenance.  3. Followup will be in 4 months' time.  She is to contact us prior to that      time should any new problems arise.       Gailen Shelter, MD      CLG/MedQ  DD:  10/01/2006  DT:  10/01/2006  Job #:  [id number]

## 2011-05-05 NOTE — Assessment & Plan Note (Signed)
[facility]                               PULMONARY OFFICE NOTE   NAME:Amber Roman, Amber Roman                       MRN:          [id number]  DATE:08/15/2006                            DOB:          02-Jul-1946    HISTORY OF PRESENT ILLNESS:  The patient is a 65 year old white female  patient of Dr. Jayme Cloud' who has a known history of asthmatic bronchitis,  presents with a 1-week history of minimally productive cough and wheezing.  The patient denies any purulent sputum, fever, hemoptysis, chest pain,  orthopnea, PND, or leg swelling.   PHYSICAL EXAMINATION:  GENERAL:  The patient is a pleasant female in no  acute distress.  VITAL SIGNS:  She is afebrile with stable vital signs.  O2 saturation is 97%  on room air.  HEENT:  Unremarkable.  NECK:  Supple without adenopathy.  No JVD.  LUNGS:  Lung sounds reveal a few expiratory wheezes bilaterally.  CARDIAC:  Regular rate and rhythm.  ABDOMEN:  Soft and benign.  EXTREMITIES:  Warm without any edema.   IMPRESSION/PLAN:  Mild asthmatic flare.  The patient is to begin using XDMI  twice a day, prednisone taper over the next week.  The patient has recently  had a flare possibly one month ago.  If she continues to have flare-ups may  need to increase her Advair dose up.  The patient was given a Xopenex  nebulizer treatment in the office and will return here in 2-3 weeks with Dr.  Jayme Cloud.                                   Rubye Oaks, NP                                Gailen Shelter, MD   TP/MedQ  DD:  08/16/2006  DT:  08/16/2006  Job #:  [id number]

## 2011-05-05 NOTE — Procedures (Signed)
[facility]  Patient:    Amber Roman, Amber Roman Visit Number: [id number] MRN: [id number]          Service Type: END Location: ENDO Attending Physician:  Charna Elizabeth Dictated by:   Anselmo Rod, M.D. Proc. Date: 10/16/01 Admit Date:  10/16/2001   CC:         Ravi R. Felipa Eth, M.D.  Dr. Andy Gauss, [facility]   Procedure Report  DATE OF BIRTH:  1946/06/06  PROCEDURE PERFORMED:  Colonoscopy with snare polypectomy x 3.  ENDOSCOPIST:  Anselmo Rod, M.D.  INSTRUMENT USED:  Pediatric Olympus colonoscope.  INDICATIONS:  A polyp seen on barium enema in a 65 year old white female treated for uterine cancer earlier this year.  Polypectomy is planned and a colonoscopy is to be done to rule out other polyps.  PREPROCEDURE PREPARATION:  Informed consent was procured from the patient. The patient was fasted for 8 hours prior to the procedure and prepped with a bottle of magnesium citrate and a gallon of NuLytely the night prior to the procedure.  PREPROCEDURE PHYSICAL:  Patient has stable vital signs.  NECK: Supple.  CHEST:  Clear to auscultation. S1, S2 regular.  ABDOMEN:  Soft with normal bowel sounds.  DESCRIPTION OF PROCEDURE:  The patient was placed in the left lateral decubitus position and sedated with 50 mg of Demerol and 6 mg of Versed intravenously.  Once the patient was adequately sedated and maintained on low-flow oxygen and continuous cardiac monitoring, the pediatric Olympus colonoscope was advanced from the rectum to the cecum with slight difficulty secondary to some tortuosity at about 25 cm.  The patients position was changed from the left lateral to the supine and the right lateral position to facilitate maneuvering of the scope through this narrow area.  The procedure was completed up to the cecum.  The ileocecal valve and appendiceal orifice were clearly visualized and photographed.  A flat polyp was  seen in the proximal right colon that seemed to be a villus adenoma.  This was removed piecemeal by snare polypectomy x 2.  There was a small amount of bleeding from the polypectomy site, but bleeding seemed to have stopped before the scope was withdrawn.  There were a few early left-sided diverticula seen in the right colon.  A 1 cm polyp was snared from 20 cm.  The patient tolerated the procedure well without complication.  IMPRESSION: 1. One right-sided and one left-sided colonic polyp snared as described above. 2. A few scattered diverticula in the right colon. 3. No large masses or hemorrhoids seen.  RECOMMENDATIONS: 1. Await pathology results. 2. Avoid all nonsteroidals for the next 2-3 weeks. 3. Outpatient follow-up in the next two weeks. Dictated by:   Anselmo Rod, M.D. Attending Physician:  Charna Elizabeth DD:  10/16/01 TD:  10/16/01 Job: [id number]

## 2011-05-05 NOTE — Assessment & Plan Note (Signed)
[facility]                             PULMONARY OFFICE NOTE   DEALVA, LAFOY                       MRN:          [id number]  DATE:03/27/2007                            DOB:          04-13-1946    Amber Roman is a very pleasant, 65 year old female who presents for followup  on mild asthmatic bronchitis and chronic nasal congestion.  She has had  issues with chronic rhinosinusitis.  She presents for followup.  We have  done workup in the past which is remarkable for normal PFTs and the  patient has had no evidence of development of chronic obstructive  pulmonary disease.  She does, however, have frequent problems with  bronchitis usually initiated by sinusitis.  We have been decreasing her  Advair because she did not find that this was helpful.  Currently, she  is on 100/50 and only uses it irregularly.  I have asked her since she  is not using it regularly, to discontinue this medication.   We did discuss CT scan results of March 14.  The patient had a polyp  noted on the right maxillary sinus.  The patient does note that the  majority of her symptoms are usually noted from the right side.  She  denies any other acute problem.  She is currently compliant with saline  nasal rinses and, as noted, not using Advair hardly at all.   CURRENT MEDICATIONS:  As noted on the intake sheet.  These have been  reviewed and are accurate.   PHYSICAL EXAMINATION:  VITAL SIGNS:  As noted.  Oxygen saturation was 95  percent on room air.  GENERAL:  This is a well-developed, well-nourished female who is in no  acute distress.  HEENT:  Remarkable for rhinitis changes bilaterally.  NECK:  Supple.  No adenopathy noted.  No JVD.  LUNGS:  Clear to auscultation bilaterally.  CARDIAC:  Regular rate and rhythm.  No rubs, murmurs, gallops heard.   IMPRESSION:  Chronic rhinosinusitis and right nasal polyp.   PLAN:  Referral to ENT.  The patient will see Dr. Alita Chyle on  June 4.  In addition, we will refer her to Dr. Jetty Duhamel for management of  her chronic allergic symptoms and for further allergic testing to see if  her recurrent symptoms can be ameliorated.  I will also defer further  care of her respiratory issues to Dr. Maple Hudson, as I will be leaving the  practice.     Gailen Shelter, MD  Electronically Signed    CLG/MedQ  DD: 03/27/2007  DT: 03/28/2007  Job #: [id number]   cc:   Larina Earthly, M.D.

## 2011-05-05 NOTE — Assessment & Plan Note (Signed)
[facility]                             PULMONARY OFFICE NOTE   Amber Roman, Amber Roman                       MRN:          [id number]  DATE:02/12/2007                            DOB:          [birth year]    HISTORY OF PRESENT ILLNESS:  The patient is a 65 year old, white female  patient of Dr. Jayme Cloud' who has a known history of COPD and asthmatic  bronchitis who presents for an acute office visit.  The patient  complains of a 7-day history of nasal congestion, productive cough, sore  throat, and hoarseness.  The patient denies any hemoptysis, orthopnea,  PND, or leg swelling.  The patient reports symptoms are slightly  improved with decreased purulence; however, she continues to have  significant coughing.   PAST MEDICAL HISTORY:  Reviewed.   CURRENT MEDICATIONS:  Reviewed.   PHYSICAL EXAM:  The patient is a pleasant female in no acute distress.  She is afebrile with stable vital signs.  Her O2 saturation is 97% on  room air.  HEENT:  Nasal mucosa has mild turbinate edema.  Nontender sinuses.  Posterior oropharynx is clear.  NECK:  Supple without adenopathy.  No JVD.  Lung sounds reveal coarse breath sounds without any wheezing or  crackles.  CARDIAC:  Regular rate.  ABDOMEN:  Soft and nontender.  EXTREMITIES:  Warm without any edema.   IMPRESSION AND PLAN:  Acute exacerbation of asthmatic bronchitis.  The  patient is to continue on Mucinex DM twice daily.  Increased Advair up  to twice daily.  The patient is to rinse and gargle well after each use.  Use Tussionex 1 teaspoon every 12 hours as needed.  No refills given.  The patient was given a prescription for Omnicef  to have on hold for 7  days in case sputum purulent continues.  The patient was given a Xopenex  nebulizer treatment.  The patient will return back with Dr. Marcos Eke  next week as scheduled or sooner if needed.      Rubye Oaks, NP  Electronically Signed      Gailen Shelter, MD  Electronically Signed   TP/MedQ  DD: 02/12/2007  DT: 02/12/2007  Job #: [id number]

## 2011-05-05 NOTE — Op Note (Signed)
NAME:  Amber Roman, Amber Roman                          ACCOUNT NO.:  [id number]   MEDICAL RECORD NO.:  [id number]                   PATIENT TYPE:  OIB   LOCATION:  2852                                 FACILITY:  [facility]   PHYSICIAN:  Robert L. Dione Booze, M.D.               DATE OF BIRTH:  February 12, 1946   DATE OF PROCEDURE:  04/09/2003  DATE OF DISCHARGE:  04/07/2003                                 OPERATIVE REPORT   INDICATIONS AND JUSTIFICATIONS FOR THE PROCEDURE:  The patient has been  followed in my office for several years having been seen through the  referral of a friend of hers.  On February 11, 2003 she had multiple  complaints regarding her upper eyelids.  She could feel the weight of the  skin and basically felt she could see better if she pulled the skin up and  that opened up her vision.  The visual field loss was confirmed with visual  field testing and photographed to document the redundant skin of each upper  eyelid.  The margin reflex distance was about 1 to 2 mm.  Otherwise, her  exam shows she is correctable to 20/20 in each eye and pressures of 15 and  confrontation fields are reduced superiorly.  The pupils, motility,  conjunctiva, cornea, anterior chamber and dilated fundus exam were negative.  On slit lamp she might have early signs of nuclear cataracts and externally  the skin of each upper eyelid does cover the eyelashes and comes close to  the pupil.  She has decided to have upper eyelid blepharoplasties for visual  reasons.  Medically, she should be stable for this.   JUSTIFICATION FOR OUTPATIENT SETTING:  Routine.   JUSTIFICATION FOR OVERNIGHT STAY:  None.   PREOPERATIVE DIAGNOSIS:  Severe dermatochalasis of the skin of each upper  eyelid.   POSTOPERATIVE DIAGNOSIS:  Severe dermatochalasis of the skin of each upper  eyelid.   OPERATION PERFORMED:  Upper eyelid blepharoplasty.   SURGEON:  Robert L. Dione Booze, M.D.   ANESTHESIA:  1% Xylocaine with epinephrine.   DESCRIPTION OF PROCEDURE:  The patient arrived in the minor surgery room and  was prepped and draped in the routine fashion.  1% Xylocaine with  epinephrine was given to each upper eyelid and the skin to be removed was  carefully demarcated and excised.  Bleeding was controlled with cautery and  pressure and each wound was closed with a running 6-0 nylon suture.  Pressure patches were applied and the patient left the operating room having  done well.    FOLLOWUP CARE:  The patient will be seen in my office in the next five or  six days to have the sutures removed.  She is to use warm compresses and is  to remove the patches in several hours.  Robert L. Dione Booze, M.D.    RLG/MEDQ  D:  04/09/2003  T:  04/10/2003  Job:  [id number]   cc:   Larina Earthly, M.D.  [address]  [place]  [place] [address]  Fax: [phone]

## 2011-05-05 NOTE — Assessment & Plan Note (Signed)
[facility]                             PULMONARY OFFICE NOTE   NAME:Amber Roman, [name]                       MRN:          [id number]  DATE:02/22/2007                            DOB:          1946/12/10    This is a very pleasant 65 year old white female that I follow here for  history of asthmatic bronchitis and chronic nasal congestion.  She has  chronic rhinosinusitis.  The patient is a patient of Dr. Daleen Bo Avva's.  The patient has had remarkably normal PFTs and really has not shown any  evidence chronic obstructive pulmonary disease.  She does have however  frequent exacerbations with bronchitis that are usually initiated by  problems with nasal congestion and nasal purulent discharge.  The  patient recently had an exacerbation.  She notes that Advair does not  help any of her symptoms.  We had actually been decreasing the Advair  from the 250 to the 100/50.  She does not use this regularly because of  irritation of the upper airway.   CURRENT MEDICATIONS:  Are as noted on the intake sheet, these have been  reviewed and are accurate.   PHYSICAL EXAMINATION:  VITAL SIGNS:  Noted.  Oxygen saturation 94% on  room air.  GENERAL:  This is a well-developed, well-nourished female who is in no  acute distress.  HEENT:  Remarkable for nasal turbinate edema, otherwise unremarkable.  PHARYNX:  Clear.  NECK:  Supple, no adenopathy noted, no JVD.  LUNGS:  She has some occasional rhonchi noted but no wheezes.  CARDIAC:  Regular rate and rhythm, no rubs, murmurs, or gallops heard.  EXTREMITIES:  No cyanosis, no clubbing, no edema.   We did perform spirometry today which was entirely normal.   IMPRESSION:  1. Persistent rhinosinusitis, query whether patient has chronic      sinusitis.  2. Recurrent episodes of tracheobronchitis due to sinopulmonary      disease.   PLAN:  1. To obtain a CT scan of the sinuses to rule out chronic sinusitis      and to  determine whether the patient needs ENT evaluation.  2. Followup will be in 4-6 week's time.  At that point we will make a      referral to Dr. Jetty Duhamel.  This is patient's choice given that      she would like to have management of potential allergies and      potential pulmonary disease by one physician, and also due to the      fact that I will be leaving the practice.     Gailen Shelter, MD  Electronically Signed    CLG/MedQ  DD: 02/22/2007  DT: 02/23/2007  Job #: [id number]

## 2011-05-05 NOTE — Op Note (Signed)
NAMEEMERSON, BARRETTO                ACCOUNT NO.:  [id number]   MEDICAL RECORD NO.:  [id number]          PATIENT TYPE:  AMB   LOCATION:  ENDO                         FACILITY:  [facility]   PHYSICIAN:  Anselmo Rod, M.D.  DATE OF BIRTH:  Dec 10, 1946   DATE OF PROCEDURE:  11/02/2004  DATE OF DISCHARGE:                                 OPERATIVE REPORT   PROCEDURE PERFORMED:  Colonoscopy with cold biopsies times two.   ENDOSCOPIST:  Charna Elizabeth, M.D.   INSTRUMENT USED:  Olympus video colonoscope.   INDICATIONS FOR PROCEDURE:  The patient is a 65 year old white female with a  personal history of adenomatous polyps, uterine cancer and basal cell  carcinoma undergoing screening colonoscopy.  Rule out colonic polyps,  masses, etc.  The risks and benefits of the procedure including bleeding,  perforation and 10% miss rates of polyps and cancer were discussed with the  patient great detail.   PREPROCEDURE PHYSICAL:  The patient had stable vital signs.  Neck supple.  Chest clear to auscultation.  S1 and S2 regular.  Abdomen soft with normal  bowel sounds.   DESCRIPTION OF PROCEDURE:  The patient was placed in left lateral decubitus  position and sedated with 60 mg of Demerol and 6 mg of Versed in slow  incremental doses.  Once the patient was adequately sedated and maintained  on low flow oxygen and continuous cardiac monitoring, the Olympus video  colonoscope was advanced from the rectum to the cecum.  The appendicular  orifice and ileocecal valve were clearly visualized and photographed.  Two  small sessile polyps were biopsied from the rectum.  Retroflexion in the  rectum revealed small internal hemorrhoids.  No diverticulosis was seen.  The patient tolerated the procedure well without complications.  The  appendicular orifice and ileocecal valve were clearly visualized and the  terminal ileum appeared normal.   IMPRESSION:  1.  Essentially normal colonoscopy up to the terminal ileum  except for two      small sessile polyps biopsied (cold) from the rectum.  2.  Small internal hemorrhoids.   RECOMMENDATIONS:  1.  Await pathology results.  2.  Avoid all nonsteroidals including aspirin for the next two weeks.  3.  Repeat colorectal cancer screening depending on pathology results.  4.  Outpatient followup as need arises in the future.  5.  Continue high fiber diet with liberal fluid intake.       JNM/MEDQ  D:  11/02/2004  T:  11/02/2004  Job:  [id number]   cc:   Larina Earthly, M.D.  [address]  [place]  [place] [address]  Fax: [phone]

## 2011-05-05 NOTE — Assessment & Plan Note (Signed)
[facility]                               PULMONARY OFFICE NOTE   NAME:Amber Roman, Amber Roman                       MRN:          [id number]  DATE:08/23/2006                            DOB:          January 12, 1946    HISTORY OF PRESENT ILLNESS:  Patient is a 65 year old white female patient  of Dr. Jayme Cloud, who has a known history of asthmatic bronchitis, was seen  in here approximately 1 week ago for an asthmatic flare.  Patient was  prescribed a prednisone taper and states that she has significant  improvement of symptoms; however, complains over the last 4 days she has  gotten increased nasal congestion, sinus pain and pressure with purulent  discharge.  She denies any hemoptysis, chest pain, orthopnea, PND or  wheezing.   PAST MEDICAL HISTORY:  Reviewed.   CURRENT MEDICATIONS:  Reviewed.   PHYSICAL EXAMINATION:  GENERAL:  This is a pleasant female in no acute  distress.  She is afebrile.  VITAL SIGNS:  Stable vital signs.  O2 saturation is 96% on room air.  HEENT:  Nasal mucosa is erythematous.  Maxillary sinus tenderness with  pressure.  Posterior facies clear.  NECK:  Supple without adenopathy.  LUNGS:  Clear without any wheezing or crackles.  CARDIAC:  Regular rate and rhythm.  ABDOMEN:  Soft.  EXTREMITIES:  Warm without any DVT.   IMPRESSION/PLAN:  Acute rhinosinusitis.  Patient is to be given Omnicef x10  days with August 22, 2006 DM twice daily.  Patient is to return to Dr.  Jayme Cloud as scheduled or sooner if needed.                                   Rubye Oaks, NP                                Gailen Shelter, MD   TP/MedQ  DD:  08/23/2006  DT:  08/23/2006  Job #:  [id number]

## 2011-10-10 ENCOUNTER — Ambulatory Visit: Payer: Self-pay | Admitting: Internal Medicine

## 2011-10-25 ENCOUNTER — Encounter: Payer: Self-pay | Admitting: Internal Medicine

## 2011-10-26 ENCOUNTER — Ambulatory Visit (INDEPENDENT_AMBULATORY_CARE_PROVIDER_SITE_OTHER): Payer: Medicare Other | Admitting: Internal Medicine

## 2011-10-26 ENCOUNTER — Encounter: Payer: Self-pay | Admitting: Internal Medicine

## 2011-10-26 VITALS — BP 122/76 | HR 68 | Ht 61.5 in | Wt 151.8 lb

## 2011-10-26 DIAGNOSIS — J00 Acute nasopharyngitis [common cold]: Secondary | ICD-10-CM

## 2011-10-26 DIAGNOSIS — H1045 Other chronic allergic conjunctivitis: Secondary | ICD-10-CM

## 2011-10-26 NOTE — Progress Notes (Signed)
10/26/11- 65 yoF former smoker, followed for bronchitis, rhinitis, allergic conjunctivitis LOV-06/13/2010 She has had flu vaccine. Since last here has done very well from an allergy and respiratory standpoint. Patanol eyedrops and Flonase nasal spray help when needed.  ROS-see HPI Constitutional:   No-   weight loss, night sweats, fevers, chills, fatigue, lassitude. HEENT:   No-  headaches, difficulty swallowing, tooth/dental problems, sore throat,       + sneezing, itching, ear ache, nasal congestion, post nasal drip,  CV:  No-   chest pain, orthopnea, PND, swelling in lower extremities, anasarca,                                  dizziness, palpitations Resp: No-   shortness of breath with exertion or at rest.              No-   productive cough,  No non-productive cough,  No- coughing up of blood.              No-   change in color of mucus.  No- wheezing.   Skin: No-   rash or lesions. GI:  No-   heartburn, indigestion, abdominal pain, nausea, vomiting, diarrhea,                 change in bowel habits, loss of appetite GU: No-   dysuria, change in color of urine, no urgency or frequency.  No- flank pain. MS:  No-   joint pain or swelling.  No- decreased range of motion.  No- back pain. Neuro-     nothing unusual Psych:  No- change in mood or affect. No depression or anxiety.  No memory loss.     OBJ General- Alert, Oriented, Affect-appropriate, Distress- none acute Skin- rash-none, lesions- none, excoriation- none Lymphadenopathy- none Head- atraumatic            Eyes- Gross vision intact, PERRLA, conjunctivae look pale, clear secretions            Ears- Hearing, canals-normal            Nose- mucus crusting, no-Septal dev, mucus, polyps, erosion, perforation             Throat- Mallampati II , mucosa clear , drainage- none, tonsils- atrophic Neck- flexible , trachea midline, no stridor , thyroid nl, carotid no bruit Chest - symmetrical excursion , unlabored           Heart/CV-  RRR , no murmur , no gallop  , no rub, nl s1 s2                           - JVD- none , edema- none, stasis changes- none, varices- none           Lung- clear to P&A, wheeze- none, cough- none , dullness-none, rub- none           Chest wall-  Abd- tender-no, distended-no, bowel sounds-present, HSM- no Br/ Gen/ Rectal- Not done, not indicated Extrem- cyanosis- none, clubbing, none, atrophy- none, strength- nl Neuro- grossly intact to observation

## 2011-10-26 NOTE — Patient Instructions (Signed)
Please call for refills or as needed

## 2011-10-30 NOTE — Assessment & Plan Note (Signed)
Good control. We will refill her eyedrops. Discussed symptomatic management.

## 2011-10-30 NOTE — Assessment & Plan Note (Signed)
She remains currently comfortable with her medications. We discussed options and discussed her medications carefully. She will call for refills as needed.

## 2011-12-14 ENCOUNTER — Ambulatory Visit: Payer: Medicare Other | Admitting: Physical Therapy

## 2011-12-27 ENCOUNTER — Ambulatory Visit: Payer: Medicare Other | Attending: Orthopedic Surgery | Admitting: Physical Therapy

## 2011-12-27 DIAGNOSIS — M542 Cervicalgia: Secondary | ICD-10-CM | POA: Insufficient documentation

## 2011-12-27 DIAGNOSIS — M6281 Muscle weakness (generalized): Secondary | ICD-10-CM | POA: Insufficient documentation

## 2011-12-27 DIAGNOSIS — R293 Abnormal posture: Secondary | ICD-10-CM | POA: Insufficient documentation

## 2011-12-27 DIAGNOSIS — IMO0001 Reserved for inherently not codable concepts without codable children: Secondary | ICD-10-CM | POA: Insufficient documentation

## 2011-12-29 ENCOUNTER — Encounter: Payer: Medicare Other | Admitting: Physical Therapy

## 2012-01-02 ENCOUNTER — Ambulatory Visit: Payer: Medicare Other | Admitting: Physical Therapy

## 2012-01-04 ENCOUNTER — Ambulatory Visit: Payer: Medicare Other | Admitting: Physical Therapy

## 2012-01-10 ENCOUNTER — Ambulatory Visit: Payer: Medicare Other | Admitting: Physical Therapy

## 2012-01-12 ENCOUNTER — Ambulatory Visit: Payer: Medicare Other | Admitting: Physical Therapy

## 2012-01-17 ENCOUNTER — Ambulatory Visit: Payer: Medicare Other | Admitting: Physical Therapy

## 2012-01-19 ENCOUNTER — Ambulatory Visit: Payer: Medicare Other | Attending: Orthopedic Surgery | Admitting: Physical Therapy

## 2012-01-19 DIAGNOSIS — M542 Cervicalgia: Secondary | ICD-10-CM | POA: Insufficient documentation

## 2012-01-19 DIAGNOSIS — M6281 Muscle weakness (generalized): Secondary | ICD-10-CM | POA: Insufficient documentation

## 2012-01-19 DIAGNOSIS — R293 Abnormal posture: Secondary | ICD-10-CM | POA: Insufficient documentation

## 2012-01-19 DIAGNOSIS — IMO0001 Reserved for inherently not codable concepts without codable children: Secondary | ICD-10-CM | POA: Insufficient documentation

## 2012-01-30 ENCOUNTER — Ambulatory Visit: Payer: Medicare Other | Admitting: Physical Therapy

## 2012-01-31 ENCOUNTER — Ambulatory Visit: Payer: Medicare Other | Admitting: Physical Therapy

## 2012-02-06 ENCOUNTER — Ambulatory Visit: Payer: Medicare Other | Admitting: Physical Therapy

## 2012-02-07 ENCOUNTER — Encounter: Payer: Medicare Other | Admitting: Physical Therapy

## 2012-02-12 ENCOUNTER — Telehealth: Payer: Self-pay | Admitting: Internal Medicine

## 2012-02-12 MED ORDER — DOXYCYCLINE HYCLATE 100 MG PO TABS: ORAL_TABLET | ORAL | Status: AC

## 2012-02-12 NOTE — Telephone Encounter (Signed)
Pt c/o prod cough (Yellow- clear), chest tightness, mild SOB, nasal drainage (Green), Fever up to 101 for past 3 days.  Please advise. Allergies  Allergen Reactions  . Sulfonamide Derivatives

## 2012-02-12 NOTE — Telephone Encounter (Signed)
Per CDY: doxycycline 100mg  #8, 2 today then 1 daily until gone.  Called spoke with patient, offered appt with TP this afternoon.  Pt declined and requested rx be sent in.  rx sent to verified pharmacy.  Advised pt to call if symptoms do not improve or worsen.  Pt verbalized her understanding.

## 2012-02-13 ENCOUNTER — Encounter: Payer: Medicare Other | Admitting: Physical Therapy

## 2012-02-15 ENCOUNTER — Ambulatory Visit: Payer: Medicare Other | Admitting: Physical Therapy

## 2012-02-20 ENCOUNTER — Encounter: Payer: Medicare Other | Admitting: Physical Therapy

## 2012-02-21 ENCOUNTER — Ambulatory Visit: Payer: Medicare Other | Attending: Orthopedic Surgery | Admitting: Physical Therapy

## 2012-02-21 DIAGNOSIS — R293 Abnormal posture: Secondary | ICD-10-CM | POA: Insufficient documentation

## 2012-02-21 DIAGNOSIS — M6281 Muscle weakness (generalized): Secondary | ICD-10-CM | POA: Insufficient documentation

## 2012-02-21 DIAGNOSIS — IMO0001 Reserved for inherently not codable concepts without codable children: Secondary | ICD-10-CM | POA: Insufficient documentation

## 2012-02-21 DIAGNOSIS — M542 Cervicalgia: Secondary | ICD-10-CM | POA: Insufficient documentation

## 2012-02-26 ENCOUNTER — Telehealth: Payer: Self-pay | Admitting: Internal Medicine

## 2012-02-26 MED ORDER — AZITHROMYCIN 250 MG PO TABS: ORAL_TABLET | ORAL | Status: AC

## 2012-02-26 MED ORDER — HYDROCODONE-HOMATROPINE 5-1.5 MG/5ML PO SYRP: 5.0000 mL | ORAL_SOLUTION | Freq: Four times a day (QID) | ORAL | Status: AC | PRN

## 2012-02-26 NOTE — Telephone Encounter (Signed)
Boone Master, MA 02/12/2012 10:12 AM Signed  Per CDY: doxycycline 100mg  #8, 2 today then 1 daily until gone.   Pt states that this RX has helped in the past;Pt traveled back last night from [facility]- pt states she coughed all night last night and was scared she wasn't going to stop, chest tightness as well; feels fatigued. No sinus troubles at this time. SOB and wheezing. No openings today. Please advise.   Allergies  Allergen Reactions  . Sulfonamide Derivatives

## 2012-02-26 NOTE — Telephone Encounter (Signed)
Per CDY- call in zpack and hydromet # 200 ml 1 tsp every 6 hours as needed with no refills.  Spoke with pt and notified of recs per and she verbalized understanding. States nothing further needed.  Rxs were called to pharm.

## 2012-03-13 ENCOUNTER — Ambulatory Visit: Payer: Medicare Other | Admitting: Physical Therapy

## 2012-03-14 ENCOUNTER — Telehealth: Payer: Self-pay | Admitting: Internal Medicine

## 2012-03-14 MED ORDER — AMOXICILLIN 500 MG PO CAPS: 500.0000 mg | ORAL_CAPSULE | Freq: Three times a day (TID) | ORAL | Status: DC

## 2012-03-14 NOTE — Telephone Encounter (Signed)
I spoke with pt and is aware of CDY recs. rx has been sent to the pharmacy. 

## 2012-03-14 NOTE — Telephone Encounter (Signed)
Pt c/o cough with brownish mucus this morning. Pt denies any fever, no body aches or pain. Pt did take a Zpak 2 weeks ago. Pt leaving leaving tomorrow on a cruise Pt still has some Hydromet for cough. She is requesting an appt today or have something called to her pharmacy. Pls advise. Allergies  Allergen Reactions  . Sulfonamide Derivatives    .

## 2012-03-14 NOTE — Telephone Encounter (Signed)
Per CY-okay to give Amoxicillin 500mg #21 take 1 po tid no refills.  

## 2012-03-26 ENCOUNTER — Ambulatory Visit: Payer: Medicare Other | Attending: Orthopedic Surgery | Admitting: Physical Therapy

## 2012-03-26 DIAGNOSIS — R293 Abnormal posture: Secondary | ICD-10-CM | POA: Insufficient documentation

## 2012-03-26 DIAGNOSIS — M542 Cervicalgia: Secondary | ICD-10-CM | POA: Insufficient documentation

## 2012-03-26 DIAGNOSIS — IMO0001 Reserved for inherently not codable concepts without codable children: Secondary | ICD-10-CM | POA: Insufficient documentation

## 2012-03-26 DIAGNOSIS — M6281 Muscle weakness (generalized): Secondary | ICD-10-CM | POA: Insufficient documentation

## 2012-03-28 ENCOUNTER — Ambulatory Visit (INDEPENDENT_AMBULATORY_CARE_PROVIDER_SITE_OTHER): Payer: Medicare Other | Admitting: Internal Medicine

## 2012-03-28 ENCOUNTER — Encounter: Payer: Self-pay | Admitting: Internal Medicine

## 2012-03-28 VITALS — BP 152/68 | HR 85 | Ht 61.0 in | Wt 157.8 lb

## 2012-03-28 DIAGNOSIS — J45909 Unspecified asthma, uncomplicated: Secondary | ICD-10-CM

## 2012-03-28 DIAGNOSIS — J00 Acute nasopharyngitis [common cold]: Secondary | ICD-10-CM

## 2012-03-28 MED ORDER — FLUTICASONE-SALMETEROL 100-50 MCG/DOSE IN AEPB: 1.0000 | INHALATION_SPRAY | Freq: Two times a day (BID) | RESPIRATORY_TRACT | Status: DC

## 2012-03-28 NOTE — Patient Instructions (Signed)
Sample and script Advair 100     1 puff then rinse mouth , twice daily

## 2012-03-28 NOTE — Progress Notes (Signed)
10/26/11- 65 yoF former smoker, followed for bronchitis, rhinitis, allergic conjunctivitis LOV-06/13/2010 She has had flu vaccine. Since last here has done very well from an allergy and respiratory standpoint. Patanol eyedrops and Flonase nasal spray help when needed.  03/28/12- 65 yoF former smoker, followed for bronchitis, rhinitis, allergic conjunctivitis  PCP Dr Felipa Eth Reports a good winter with no major acute problems. Some easy exertional dyspnea climbing stairs but admits little routine exercise. Stop Advair 2 years ago and is noticing a little wheezing. She asks about restarting Advair. Coughs up small mucous plugs at times. Frontal headache. Using Flonase and Pataday nasal spray.   ROS-see HPI Constitutional:   No-   weight loss, night sweats, fevers, chills, fatigue, lassitude. HEENT:   No-  headaches, difficulty swallowing, tooth/dental problems, sore throat,       + sneezing, itching, ear ache, nasal congestion, post nasal drip,  CV:  No-   chest pain, orthopnea, PND, swelling in lower extremities, anasarca, dizziness, palpitations Resp: No-   shortness of breath with exertion or at rest.              No-   productive cough,  No non-productive cough,  No- coughing up of blood.              No-   change in color of mucus.  + wheezing.   Skin: No-   rash or lesions. GI:  No-   heartburn, indigestion, abdominal pain, nausea, vomiting,  GU: No-   dysuria,  MS:  No-   joint pain or swelling.  . Neuro-     nothing unusual Psych:  No- change in mood or affect. No depression or anxiety.  No memory loss.  OBJ General- Alert, Oriented, Affect-appropriate, Distress- none acute Skin- rash-none, lesions- none, excoriation- none Lymphadenopathy- none Head- atraumatic            Eyes- Gross vision intact, PERRLA, conjunctivae look pale, clear secretions            Ears- Hearing, canals-normal            Nose- mucus crusting, no-Septal dev, mucus, polyps, erosion, perforation   Throat- Mallampati II , mucosa clear , drainage- none, tonsils- atrophic Neck- flexible , trachea midline, no stridor , thyroid nl, carotid no bruit Chest - symmetrical excursion , unlabored           Heart/CV- RRR , no murmur , no gallop  , no rub, nl s1 s2                           - JVD- none , edema- none, stasis changes- none, varices- none           Lung- clear to P&A, wheeze- none, cough- none , dullness-none, rub- none           Chest wall-  Abd-  Br/ Gen/ Rectal- Not done, not indicated Extrem- cyanosis- none, clubbing, none, atrophy- none, strength- nl Neuro- grossly intact to observation

## 2012-04-01 NOTE — Assessment & Plan Note (Signed)
Plan-continue antihistamines and Pataday

## 2012-04-01 NOTE — Assessment & Plan Note (Signed)
Mild variable intermittent and probably multifactorial. Occasionally viral or allergic. Plan-restart Advair 100 for trial. Continue allergy meds.

## 2012-04-11 ENCOUNTER — Ambulatory Visit: Payer: Medicare Other | Admitting: Physical Therapy

## 2012-10-25 ENCOUNTER — Ambulatory Visit: Payer: Medicare Other | Admitting: Internal Medicine

## 2012-10-28 ENCOUNTER — Ambulatory Visit: Payer: Medicare Other | Admitting: Internal Medicine

## 2012-12-19 ENCOUNTER — Other Ambulatory Visit (HOSPITAL_COMMUNITY): Payer: Self-pay | Admitting: Orthopaedic Surgery

## 2012-12-23 ENCOUNTER — Encounter: Payer: Self-pay | Admitting: Internal Medicine

## 2012-12-23 ENCOUNTER — Ambulatory Visit (INDEPENDENT_AMBULATORY_CARE_PROVIDER_SITE_OTHER)
Admission: RE | Admit: 2012-12-23 | Discharge: 2012-12-23 | Disposition: A | Discharge status: Home / Self Care | Payer: Medicare Other | Source: Ambulatory Visit | Attending: Internal Medicine | Admitting: Internal Medicine

## 2012-12-23 ENCOUNTER — Ambulatory Visit (INDEPENDENT_AMBULATORY_CARE_PROVIDER_SITE_OTHER): Payer: Medicare Other | Admitting: Internal Medicine

## 2012-12-23 VITALS — BP 126/74 | HR 67 | Ht 61.0 in | Wt 157.0 lb

## 2012-12-23 DIAGNOSIS — J45909 Unspecified asthma, uncomplicated: Secondary | ICD-10-CM

## 2012-12-23 DIAGNOSIS — J00 Acute nasopharyngitis [common cold]: Secondary | ICD-10-CM

## 2012-12-23 MED ORDER — FLUTICASONE PROPIONATE 50 MCG/ACT NA SUSP: 2.0000 | Freq: Every day | NASAL | Status: DC

## 2012-12-23 NOTE — Progress Notes (Signed)
10/26/11- 65 yoF former smoker, followed for bronchitis, rhinitis, allergic conjunctivitis LOV-06/13/2010 She has had flu vaccine. Since last here has done very well from an allergy and respiratory standpoint. Patanol eyedrops and Flonase nasal spray help when needed.  03/28/12- 65 yoF former smoker, followed for bronchitis, rhinitis, allergic conjunctivitis  PCP Dr Felipa Eth Reports a good winter with no major acute problems. Some easy exertional dyspnea climbing stairs but admits little routine exercise. Stop Advair 2 years ago and is noticing a little wheezing. She asks about restarting Advair. Coughs up small mucous plugs at times. Frontal headache. Using Flonase and Pataday nasal spray.  12/23/12- 63 yoF former smoker, followed for bronchitis, rhinitis, allergic conjunctivitis  PCP Dr Felipa Eth FOLLOWS FOR: using Advair as needed. she doesn't have much energy and still gives out quickly. Has not been needing Advair regularly. She doesn't have a rescue inhaler and doesn't think she needs one. Little morning cough with scant mucus. Rhinitis controlled with Flonase, needs refill.  She is walking less because of arthritis and realizes she is deconditioned and gaining weight. She is pending hip replacement at the end of this month. Has had dizzy spells with sweating 2 weeks ago on first waking, and told her primary physician. Some tinnitus.  ROS-see HPI Constitutional:   No-   weight loss, night sweats, fevers, chills, fatigue, lassitude. HEENT:   No-  headaches, difficulty swallowing, tooth/dental problems, sore throat,     No- sneezing, itching, ear ache, nasal congestion, post nasal drip,  CV:  No-   chest pain, orthopnea, PND, swelling in lower extremities, anasarca, dizziness, palpitations Resp: No-   shortness of breath with exertion or at rest.              No-   productive cough,  No non-productive cough,  No- coughing up of blood.              No-   change in color of mucus.  + Occasional wheezing.     Skin: No-   rash or lesions. GI:  No-   heartburn, indigestion, abdominal pain, nausea, vomiting,  GU: No-   dysuria,  MS:  + joint pain or swelling.  . Neuro-     nothing unusual Psych:  No- change in mood or affect. No depression or anxiety.  No memory loss.  OBJ General- Alert, Oriented, Affect-appropriate, Distress- none acute Skin- rash-none, lesions- none, excoriation- none Lymphadenopathy- none Head- atraumatic            Eyes- Gross vision intact, PERRLA, conjunctivae look pale, clear secretions            Ears- Hearing, canals-normal            Nose- clear, no-Septal dev, mucus, polyps, erosion, perforation             Throat- Mallampati II , mucosa clear , drainage- none, tonsils- atrophic Neck- flexible , trachea midline, no stridor , thyroid nl, carotid no bruit Chest - symmetrical excursion , unlabored           Heart/CV- RRR , no murmur , no gallop  , no rub, nl s1 s2                           - JVD- none , edema- none, stasis changes- none, varices- none           Lung- clear to P&A, wheeze- none, cough- none , dullness-none, rub- none  Chest wall-  Abd-  Br/ Gen/ Rectal- Not done, not indicated Extrem- cyanosis- none, clubbing, none, atrophy- none, strength- nl Neuro- grossly intact to observation. No nystagmus, and no carotid bruit.

## 2012-12-23 NOTE — Patient Instructions (Addendum)
Order- CXR   Dx  Asthma with bronchitis  Script sent refilling flonase

## 2012-12-30 ENCOUNTER — Encounter (HOSPITAL_COMMUNITY): Payer: Self-pay | Admitting: Pharmacy Technician

## 2012-12-30 NOTE — Patient Instructions (Signed)
Amber Roman  12/30/2012   Your procedure is scheduled on: 01/10/13  Report to [facility] at 0530 AM.  Call this number if you have problems the morning of surgery: [phone]   Remember:   Do not eat food or drink liquids after midnight.   Take these medicines the morning of surgery with A SIP OF WATER:    Do not wear jewelry, make-up or nail polish.  Do not wear lotions, powders, or perfumes.    Do not shave 48 hours prior to surgery.   Do not bring valuables to the hospital.  Contacts, dentures or bridgework may not be worn into surgery.  Leave suitcase in the car. After surgery it may be brought to your room.  For patients admitted to the hospital, checkout time is 11:00 AM the day of  discharge.                 SEE CHG INSTRUCTION SHEET    Please read over the following fact sheets that you were given: MRSA Information, Blood Transfusion Fact Sheet, coughing and deep breathing exercises , leg exercises .                Failure to comply with these instructions may result in cancellation of your surgery.               Patient Signature _____________________              Nurse Signature _____________________

## 2012-12-31 ENCOUNTER — Encounter (HOSPITAL_COMMUNITY)
Admission: RE | Admit: 2012-12-31 | Discharge: 2012-12-31 | Disposition: A | Discharge status: Home / Self Care | Payer: Medicare Other | Source: Ambulatory Visit | Attending: Orthopaedic Surgery | Admitting: Orthopaedic Surgery

## 2012-12-31 ENCOUNTER — Encounter (HOSPITAL_COMMUNITY): Payer: Self-pay

## 2012-12-31 HISTORY — DX: Unspecified osteoarthritis, unspecified site: M19.90

## 2012-12-31 HISTORY — DX: Major depressive disorder, single episode, unspecified: F32.9

## 2012-12-31 HISTORY — DX: Depression, unspecified: F32.A

## 2012-12-31 LAB — BASIC METABOLIC PANEL
CO2: 28 mEq/L (ref 19–32)
Chloride: 105 mEq/L (ref 96–112)
Creatinine, Ser: 0.62 mg/dL (ref 0.50–1.10)
GFR calc Af Amer: >90 mL/min (ref >90)
Potassium: 4.1 mEq/L (ref 3.5–5.1)

## 2012-12-31 LAB — PROTIME-INR
INR: 0.94 (ref 0.00–1.49)
Prothrombin Time: 12.5 seconds (ref 11.6–15.2)

## 2012-12-31 LAB — URINALYSIS, ROUTINE W REFLEX MICROSCOPIC
Bilirubin Urine: NEGATIVE
Glucose, UA: NEGATIVE mg/dL
Hgb urine dipstick: NEGATIVE
Specific Gravity, Urine: 1.021 (ref 1.005–1.030)
Urobilinogen, UA: 0.2 mg/dL (ref 0.0–1.0)
pH: 5.5 (ref 5.0–8.0)

## 2012-12-31 LAB — CBC
HCT: 39.1 % (ref 36.0–46.0)
Hemoglobin: 13 g/dL (ref 12.0–15.0)
MCV: 84.4 fL (ref 78.0–100.0)
RBC: 4.63 MIL/uL (ref 3.87–5.11)
RDW: 12.7 % (ref 11.5–15.5)
WBC: 8.2 10*3/uL (ref 4.0–10.5)

## 2012-12-31 LAB — SURGICAL PCR SCREEN
MRSA, PCR: NEGATIVE
Staphylococcus aureus: NEGATIVE

## 2012-12-31 LAB — APTT: aPTT: 30 seconds (ref 24–37)

## 2012-12-31 NOTE — Progress Notes (Signed)
Patient would like to make sure both her legs are the same length post surgery .  Patient wanted it noted in her chart.

## 2012-12-31 NOTE — Progress Notes (Signed)
Last office visit note with cardiology on chart 10/23/12  ECHO 2010 on chart  10/25/12 STRESS TEST on chart  Last office visit with Dr Jetty Duhamel 12/23/12 in [facility]  EKG 10/23/12 chart  CXR- 12/24/12 [facility]

## 2013-01-01 NOTE — Assessment & Plan Note (Signed)
Minimal, well controlled. I don't think this will affect proposed hip surgery.

## 2013-01-01 NOTE — Assessment & Plan Note (Signed)
She feels adequately controlled with Flonase and an occasional antihistamine

## 2013-01-10 ENCOUNTER — Inpatient Hospital Stay (HOSPITAL_COMMUNITY): Payer: Medicare Other

## 2013-01-10 ENCOUNTER — Encounter (HOSPITAL_COMMUNITY)
Admission: RE | Disposition: A | Discharge status: Home Health | Payer: Self-pay | Source: Ambulatory Visit | Attending: Orthopaedic Surgery

## 2013-01-10 ENCOUNTER — Encounter (HOSPITAL_COMMUNITY): Payer: Self-pay | Admitting: *Deleted

## 2013-01-10 ENCOUNTER — Encounter (HOSPITAL_COMMUNITY): Payer: Self-pay | Admitting: Certified Registered Nurse Anesthetist

## 2013-01-10 ENCOUNTER — Inpatient Hospital Stay (HOSPITAL_COMMUNITY)
Admission: RE | Admit: 2013-01-10 | Discharge: 2013-01-12 | DRG: 470 | Disposition: A | Discharge status: Home Health | Payer: Medicare Other | Source: Ambulatory Visit | Attending: Orthopaedic Surgery | Admitting: Orthopaedic Surgery

## 2013-01-10 ENCOUNTER — Inpatient Hospital Stay (HOSPITAL_COMMUNITY): Payer: Medicare Other | Admitting: Certified Registered Nurse Anesthetist

## 2013-01-10 DIAGNOSIS — Z79899 Other long term (current) drug therapy: Secondary | ICD-10-CM

## 2013-01-10 DIAGNOSIS — J45909 Unspecified asthma, uncomplicated: Secondary | ICD-10-CM | POA: Diagnosis present

## 2013-01-10 DIAGNOSIS — Z7982 Long term (current) use of aspirin: Secondary | ICD-10-CM

## 2013-01-10 DIAGNOSIS — M1610 Unilateral primary osteoarthritis, unspecified hip: Principal | ICD-10-CM | POA: Diagnosis present

## 2013-01-10 DIAGNOSIS — I10 Essential (primary) hypertension: Secondary | ICD-10-CM | POA: Diagnosis present

## 2013-01-10 DIAGNOSIS — E785 Hyperlipidemia, unspecified: Secondary | ICD-10-CM | POA: Diagnosis present

## 2013-01-10 DIAGNOSIS — Z9079 Acquired absence of other genital organ(s): Secondary | ICD-10-CM

## 2013-01-10 DIAGNOSIS — Z8249 Family history of ischemic heart disease and other diseases of the circulatory system: Secondary | ICD-10-CM

## 2013-01-10 DIAGNOSIS — Z87891 Personal history of nicotine dependence: Secondary | ICD-10-CM

## 2013-01-10 DIAGNOSIS — Z90710 Acquired absence of both cervix and uterus: Secondary | ICD-10-CM

## 2013-01-10 DIAGNOSIS — F329 Major depressive disorder, single episode, unspecified: Secondary | ICD-10-CM | POA: Diagnosis present

## 2013-01-10 DIAGNOSIS — R29898 Other symptoms and signs involving the musculoskeletal system: Secondary | ICD-10-CM | POA: Diagnosis present

## 2013-01-10 DIAGNOSIS — K219 Gastro-esophageal reflux disease without esophagitis: Secondary | ICD-10-CM | POA: Diagnosis present

## 2013-01-10 DIAGNOSIS — Z8542 Personal history of malignant neoplasm of other parts of uterus: Secondary | ICD-10-CM

## 2013-01-10 DIAGNOSIS — Z882 Allergy status to sulfonamides status: Secondary | ICD-10-CM

## 2013-01-10 DIAGNOSIS — F3289 Other specified depressive episodes: Secondary | ICD-10-CM | POA: Diagnosis present

## 2013-01-10 DIAGNOSIS — M169 Osteoarthritis of hip, unspecified: Secondary | ICD-10-CM

## 2013-01-10 HISTORY — PX: TOTAL HIP ARTHROPLASTY: SHX124

## 2013-01-10 LAB — TYPE AND SCREEN: ABO/RH(D): A POS

## 2013-01-10 LAB — ABO/RH: ABO/RH(D): A POS

## 2013-01-10 SURGERY — ARTHROPLASTY, HIP, TOTAL, ANTERIOR APPROACH
Anesthesia: Spinal | Site: Hip | Laterality: Right | Wound class: Clean

## 2013-01-10 MED ORDER — MEPERIDINE HCL 50 MG/ML IJ SOLN: 6.2500 mg | INTRAMUSCULAR | Status: DC | PRN

## 2013-01-10 MED ORDER — PHENYLEPHRINE HCL 10 MG/ML IJ SOLN
10.0000 mg | INTRAVENOUS | Status: DC | PRN
  Administered 2013-01-10: 50 ug/min via INTRAVENOUS

## 2013-01-10 MED ORDER — DOCUSATE SODIUM 100 MG PO CAPS
100.0000 mg | ORAL_CAPSULE | Freq: Two times a day (BID) | ORAL | Status: DC
  Administered 2013-01-10 – 2013-01-12 (×4): 100 mg via ORAL

## 2013-01-10 MED ORDER — METHOCARBAMOL 100 MG/ML IJ SOLN
500.0000 mg | Freq: Four times a day (QID) | INTRAVENOUS | Status: DC | PRN
  Administered 2013-01-10: 500 mg via INTRAVENOUS
  Filled 2013-01-10: qty 5

## 2013-01-10 MED ORDER — LACTATED RINGERS IV SOLN
INTRAVENOUS | Status: DC | PRN
  Administered 2013-01-10 (×2): via INTRAVENOUS

## 2013-01-10 MED ORDER — PROPOFOL INFUSION 10 MG/ML OPTIME
INTRAVENOUS | Status: DC | PRN
  Administered 2013-01-10: 100 ug/kg/min via INTRAVENOUS

## 2013-01-10 MED ORDER — SODIUM CHLORIDE 0.9 % IV SOLN
INTRAVENOUS | Status: DC
  Administered 2013-01-10 – 2013-01-11 (×2): via INTRAVENOUS

## 2013-01-10 MED ORDER — ZOLPIDEM TARTRATE 5 MG PO TABS: 5.0000 mg | ORAL_TABLET | Freq: Every evening | ORAL | Status: DC | PRN

## 2013-01-10 MED ORDER — ONDANSETRON HCL 4 MG/2ML IJ SOLN: 4.0000 mg | Freq: Four times a day (QID) | INTRAMUSCULAR | Status: DC | PRN

## 2013-01-10 MED ORDER — DEXAMETHASONE SODIUM PHOSPHATE 10 MG/ML IJ SOLN
INTRAMUSCULAR | Status: DC | PRN
  Administered 2013-01-10: 10 mg via INTRAVENOUS

## 2013-01-10 MED ORDER — CEFAZOLIN SODIUM 1-5 GM-% IV SOLN
1.0000 g | Freq: Four times a day (QID) | INTRAVENOUS | Status: AC
  Administered 2013-01-10 (×2): 1 g via INTRAVENOUS
  Filled 2013-01-10 (×2): qty 50

## 2013-01-10 MED ORDER — ASPIRIN EC 325 MG PO TBEC
325.0000 mg | DELAYED_RELEASE_TABLET | Freq: Two times a day (BID) | ORAL | Status: DC
  Administered 2013-01-11 – 2013-01-12 (×3): 325 mg via ORAL
  Filled 2013-01-10 (×5): qty 1

## 2013-01-10 MED ORDER — FLUTICASONE PROPIONATE 50 MCG/ACT NA SUSP
2.0000 | Freq: Every day | NASAL | Status: DC
  Administered 2013-01-11: 2 via NASAL
  Filled 2013-01-10: qty 16

## 2013-01-10 MED ORDER — ACETAMINOPHEN 650 MG RE SUPP: 650.0000 mg | Freq: Four times a day (QID) | RECTAL | Status: DC | PRN

## 2013-01-10 MED ORDER — CEFAZOLIN SODIUM-DEXTROSE 2-3 GM-% IV SOLR
2.0000 g | INTRAVENOUS | Status: AC
  Administered 2013-01-10: 2 g via INTRAVENOUS

## 2013-01-10 MED ORDER — HYDROMORPHONE HCL PF 1 MG/ML IJ SOLN
1.0000 mg | INTRAMUSCULAR | Status: DC | PRN
  Administered 2013-01-10 – 2013-01-11 (×5): 1 mg via INTRAVENOUS
  Filled 2013-01-10 (×5): qty 1

## 2013-01-10 MED ORDER — LACTATED RINGERS IV SOLN: INTRAVENOUS | Status: DC

## 2013-01-10 MED ORDER — PANTOPRAZOLE SODIUM 40 MG PO TBEC
40.0000 mg | DELAYED_RELEASE_TABLET | Freq: Every day | ORAL | Status: DC
  Administered 2013-01-11 – 2013-01-12 (×2): 40 mg via ORAL
  Filled 2013-01-10 (×2): qty 1

## 2013-01-10 MED ORDER — OXYCODONE HCL ER 10 MG PO T12A
10.0000 mg | EXTENDED_RELEASE_TABLET | Freq: Two times a day (BID) | ORAL | Status: DC
  Administered 2013-01-10 – 2013-01-12 (×4): 10 mg via ORAL
  Filled 2013-01-10 (×4): qty 1

## 2013-01-10 MED ORDER — ONDANSETRON HCL 4 MG PO TABS: 4.0000 mg | ORAL_TABLET | Freq: Four times a day (QID) | ORAL | Status: DC | PRN

## 2013-01-10 MED ORDER — ALUM & MAG HYDROXIDE-SIMETH 200-200-20 MG/5ML PO SUSP: 30.0000 mL | ORAL | Status: DC | PRN

## 2013-01-10 MED ORDER — DULOXETINE HCL 30 MG PO CPEP
30.0000 mg | ORAL_CAPSULE | Freq: Every day | ORAL | Status: DC
  Administered 2013-01-10 – 2013-01-11 (×2): 30 mg via ORAL
  Filled 2013-01-10 (×3): qty 1

## 2013-01-10 MED ORDER — MIDAZOLAM HCL 5 MG/5ML IJ SOLN
INTRAMUSCULAR | Status: DC | PRN
  Administered 2013-01-10: 2 mg via INTRAVENOUS

## 2013-01-10 MED ORDER — DIPHENHYDRAMINE HCL 12.5 MG/5ML PO ELIX: 12.5000 mg | ORAL_SOLUTION | ORAL | Status: DC | PRN

## 2013-01-10 MED ORDER — PHENOL 1.4 % MT LIQD
1.0000 | OROMUCOSAL | Status: DC | PRN
  Filled 2013-01-10: qty 177

## 2013-01-10 MED ORDER — OMEPRAZOLE MAGNESIUM 20 MG PO TBEC
20.0000 mg | DELAYED_RELEASE_TABLET | Freq: Every morning | ORAL | Status: DC
Start: 2013-01-10 — End: 2013-01-10

## 2013-01-10 MED ORDER — DULOXETINE HCL 60 MG PO CPEP
60.0000 mg | ORAL_CAPSULE | Freq: Every morning | ORAL | Status: DC
  Administered 2013-01-11: 60 mg via ORAL
  Filled 2013-01-10 (×3): qty 1

## 2013-01-10 MED ORDER — METOCLOPRAMIDE HCL 5 MG/ML IJ SOLN: 5.0000 mg | Freq: Three times a day (TID) | INTRAMUSCULAR | Status: DC | PRN

## 2013-01-10 MED ORDER — OXYCODONE HCL 5 MG PO TABS
5.0000 mg | ORAL_TABLET | ORAL | Status: DC | PRN
  Administered 2013-01-10 (×2): 5 mg via ORAL
  Administered 2013-01-11 – 2013-01-12 (×6): 10 mg via ORAL
  Filled 2013-01-10: qty 2
  Filled 2013-01-10: qty 1
  Filled 2013-01-10 (×3): qty 2
  Filled 2013-01-10: qty 1
  Filled 2013-01-10 (×2): qty 2

## 2013-01-10 MED ORDER — FERROUS SULFATE 325 (65 FE) MG PO TABS
325.0000 mg | ORAL_TABLET | Freq: Three times a day (TID) | ORAL | Status: DC
  Administered 2013-01-10 – 2013-01-12 (×5): 325 mg via ORAL
  Filled 2013-01-10 (×8): qty 1

## 2013-01-10 MED ORDER — ONDANSETRON HCL 4 MG/2ML IJ SOLN
INTRAMUSCULAR | Status: DC | PRN
  Administered 2013-01-10: 4 mg via INTRAVENOUS

## 2013-01-10 MED ORDER — 0.9 % SODIUM CHLORIDE (POUR BTL) OPTIME
TOPICAL | Status: DC | PRN
  Administered 2013-01-10: 1000 mL

## 2013-01-10 MED ORDER — MENTHOL 3 MG MT LOZG
1.0000 | LOZENGE | OROMUCOSAL | Status: DC | PRN
  Filled 2013-01-10: qty 9

## 2013-01-10 MED ORDER — ATORVASTATIN CALCIUM 40 MG PO TABS
40.0000 mg | ORAL_TABLET | Freq: Every day | ORAL | Status: DC
  Administered 2013-01-10 – 2013-01-11 (×2): 40 mg via ORAL
  Filled 2013-01-10 (×3): qty 1

## 2013-01-10 MED ORDER — PROMETHAZINE HCL 25 MG/ML IJ SOLN: 6.2500 mg | INTRAMUSCULAR | Status: DC | PRN

## 2013-01-10 MED ORDER — ACETAMINOPHEN 10 MG/ML IV SOLN
INTRAVENOUS | Status: DC | PRN
  Administered 2013-01-10: 1000 mg via INTRAVENOUS

## 2013-01-10 MED ORDER — METOCLOPRAMIDE HCL 10 MG PO TABS: 5.0000 mg | ORAL_TABLET | Freq: Three times a day (TID) | ORAL | Status: DC | PRN

## 2013-01-10 MED ORDER — ACETAMINOPHEN 325 MG PO TABS: 650.0000 mg | ORAL_TABLET | Freq: Four times a day (QID) | ORAL | Status: DC | PRN

## 2013-01-10 MED ORDER — NEBIVOLOL HCL 10 MG PO TABS
20.0000 mg | ORAL_TABLET | Freq: Every day | ORAL | Status: DC
  Administered 2013-01-10 – 2013-01-11 (×2): 20 mg via ORAL
  Filled 2013-01-10 (×3): qty 2

## 2013-01-10 MED ORDER — BUPIVACAINE HCL (PF) 0.5 % IJ SOLN
INTRAMUSCULAR | Status: DC | PRN
  Administered 2013-01-10: 3 mL

## 2013-01-10 MED ORDER — FENTANYL CITRATE 0.05 MG/ML IJ SOLN
INTRAMUSCULAR | Status: DC | PRN
  Administered 2013-01-10: 50 ug via INTRAVENOUS

## 2013-01-10 MED ORDER — METHOCARBAMOL 500 MG PO TABS
500.0000 mg | ORAL_TABLET | Freq: Four times a day (QID) | ORAL | Status: DC | PRN
  Administered 2013-01-10 – 2013-01-11 (×3): 500 mg via ORAL
  Filled 2013-01-10 (×3): qty 1

## 2013-01-10 MED ORDER — HYDROMORPHONE HCL PF 1 MG/ML IJ SOLN: 0.2500 mg | INTRAMUSCULAR | Status: DC | PRN

## 2013-01-10 MED ORDER — SODIUM CHLORIDE 0.9 % IR SOLN
Status: DC | PRN
  Administered 2013-01-10: 1000 mL

## 2013-01-10 MED ORDER — IRBESARTAN 300 MG PO TABS
300.0000 mg | ORAL_TABLET | Freq: Every day | ORAL | Status: DC
  Administered 2013-01-10 – 2013-01-11 (×2): 300 mg via ORAL
  Filled 2013-01-10 (×3): qty 1

## 2013-01-10 SURGICAL SUPPLY — 40 items
ADH SKN CLS APL DERMABOND .7 (GAUZE/BANDAGES/DRESSINGS) ×1
BAG SPEC THK2 15X12 ZIP CLS (MISCELLANEOUS) ×2
BAG ZIPLOCK 12X15 (MISCELLANEOUS) ×4 IMPLANT
BLADE SAW SGTL 18X1.27X75 (BLADE) ×2 IMPLANT
CLOTH BEACON ORANGE TIMEOUT ST (SAFETY) ×2 IMPLANT
DERMABOND ADVANCED (GAUZE/BANDAGES/DRESSINGS) ×1
DERMABOND ADVANCED .7 DNX12 (GAUZE/BANDAGES/DRESSINGS) ×1 IMPLANT
DRAPE C-ARM 42X72 X-RAY (DRAPES) ×2 IMPLANT
DRAPE STERI IOBAN 125X83 (DRAPES) ×2 IMPLANT
DRAPE U-SHAPE 47X51 STRL (DRAPES) ×6 IMPLANT
DRSG AQUACEL AG ADV 3.5X10 (GAUZE/BANDAGES/DRESSINGS) ×2 IMPLANT
DURAPREP 26ML APPLICATOR (WOUND CARE) ×2 IMPLANT
ELECT BLADE TIP CTD 4 INCH (ELECTRODE) ×2 IMPLANT
ELECT REM PT RETURN 9FT ADLT (ELECTROSURGICAL) ×2
ELECTRODE REM PT RTRN 9FT ADLT (ELECTROSURGICAL) ×1 IMPLANT
ELIMINATOR HOLE APEX DEPUY (Hips) ×1 IMPLANT
FACESHIELD LNG OPTICON STERILE (SAFETY) ×8 IMPLANT
GLOVE BIO SURGEON STRL SZ7.5 (GLOVE) ×2 IMPLANT
GLOVE BIOGEL PI IND STRL 8 (GLOVE) ×1 IMPLANT
GLOVE BIOGEL PI INDICATOR 8 (GLOVE) ×1
GOWN STRL REIN XL XLG (GOWN DISPOSABLE) ×4 IMPLANT
HANDPIECE INTERPULSE COAX TIP (DISPOSABLE) ×2
HEAD FEMORAL 32 CERAMIC (Hips) ×1 IMPLANT
IV NS 1000ML (IV SOLUTION) ×2
IV NS 1000ML BAXH (IV SOLUTION) IMPLANT
KIT BASIN OR (CUSTOM PROCEDURE TRAY) ×2 IMPLANT
NS IRRIG 1000ML POUR BTL (IV SOLUTION) ×1 IMPLANT
PACK TOTAL JOINT (CUSTOM PROCEDURE TRAY) ×2 IMPLANT
PADDING CAST COTTON 6X4 STRL (CAST SUPPLIES) ×2 IMPLANT
SET HNDPC FAN SPRY TIP SCT (DISPOSABLE) ×1 IMPLANT
SUT ETHIBOND NAB CT1 #1 30IN (SUTURE) ×4 IMPLANT
SUT MNCRL AB 4-0 PS2 18 (SUTURE) ×2 IMPLANT
SUT VIC AB 1 CT1 36 (SUTURE) ×4 IMPLANT
SUT VIC AB 2-0 CT1 27 (SUTURE) ×4
SUT VIC AB 2-0 CT1 TAPERPNT 27 (SUTURE) ×2 IMPLANT
SUT VLOC 180 0 24IN GS25 (SUTURE) ×2 IMPLANT
TOWEL OR 17X26 10 PK STRL BLUE (TOWEL DISPOSABLE) ×3 IMPLANT
TOWEL OR NON WOVEN STRL DISP B (DISPOSABLE) ×2 IMPLANT
TRAY FOLEY CATH 14FRSI W/METER (CATHETERS) ×2 IMPLANT
WATER STERILE IRR 1500ML POUR (IV SOLUTION) ×1 IMPLANT

## 2013-01-10 NOTE — Evaluation (Signed)
Physical Therapy Evaluation Patient Details Name: Amber Roman MRN: [id number] DOB: [birth year] Today's Date: 01/10/2013 Time: 4098-1191 PT Time Calculation (min): 34 min  PT Assessment / Plan / Recommendation Clinical Impression  Pt s/p R THR presents with decreased R LE strength/ROM and post op pain limiting functional mobility    PT Assessment  Patient needs continued PT services    Follow Up Recommendations  Home health PT    Does the patient have the potential to tolerate intense rehabilitation      Barriers to Discharge None      Equipment Recommendations  Other (comment) (Pt checking with sister re: borrowing equip)    Recommendations for Other Services OT consult   Frequency 7X/week    Precautions / Restrictions Precautions Precautions: Fall Restrictions Weight Bearing Restrictions: No Other Position/Activity Restrictions: WBAT   Pertinent Vitals/Pain 5-6/10; premed, RN aware, ice packs provided      Mobility  Bed Mobility Bed Mobility: Supine to Sit;Sit to Supine Supine to Sit: 4: Min assist Sit to Supine: 4: Min assist;3: Mod assist Details for Bed Mobility Assistance: cues for sequence with assist for R LE Transfers Transfers: Sit to Stand;Stand to Sit Sit to Stand: 4: Min assist Stand to Sit: 4: Min assist Details for Transfer Assistance: cues for use of UEs and for LE management Ambulation/Gait Ambulation/Gait Assistance: 4: Min assist;3: Mod assist Ambulation Distance (Feet): 31 Feet Assistive device: Rolling walker Ambulation/Gait Assistance Details: cues for posture, sequence, and position from RW Gait Pattern: Step-to pattern    Shoulder Instructions     Exercises Total Joint Exercises Ankle Circles/Pumps: AROM;Both;10 reps;Supine Quad Sets: AROM;Both;10 reps;Supine Heel Slides: AAROM;10 reps;Supine;Right Hip ABduction/ADduction: AAROM;10 reps;Supine;Right   PT Diagnosis: Difficulty walking  PT Problem List: Decreased  strength;Decreased range of motion;Decreased activity tolerance;Decreased mobility;Decreased knowledge of use of DME;Pain PT Treatment Interventions: DME instruction;Gait training;Stair training;Functional mobility training;Therapeutic activities;Therapeutic exercise;Patient/family education   PT Goals Acute Rehab PT Goals PT Goal Formulation: With patient Time For Goal Achievement: 01/15/13 Potential to Achieve Goals: Good Pt will go Supine/Side to Sit: with supervision PT Goal: Supine/Side to Sit - Progress: Goal set today Pt will go Sit to Supine/Side: with supervision PT Goal: Sit to Supine/Side - Progress: Goal set today Pt will go Sit to Stand: with supervision PT Goal: Sit to Stand - Progress: Goal set today Pt will go Stand to Sit: with supervision PT Goal: Stand to Sit - Progress: Goal set today Pt will Ambulate: 51 - 150 feet;with supervision;with rolling walker PT Goal: Ambulate - Progress: Goal set today Pt will Go Up / Down Stairs: 1-2 stairs;with min assist;with least restrictive assistive device PT Goal: Up/Down Stairs - Progress: Goal set today  Visit Information  Last PT Received On: 01/10/13 Assistance Needed: +1    Subjective Data  Subjective: I want to get moving and get home Patient Stated Goal: Resume previous lifestyle with decreased pain   Prior Functioning  Home Living Lives With: Friend(s) Available Help at Discharge: Family;Friend(s) Type of Home: House Home Access: Stairs to enter [employer] of Steps: 2 Entrance Stairs-Rails: None Home Layout: Able to live on main level with bedroom/bathroom Additional Comments: Pt is checking with sister on borrowing equipment Prior Function Level of Independence: Independent Able to Take Stairs?: Yes Driving: Yes Vocation: Retired Musician: No difficulties Dominant Hand: Right    Cognition  Overall Cognitive Status: Appears within functional limits for tasks  assessed/performed Arousal/Alertness: Awake/alert Orientation Level: Appears intact for tasks assessed Behavior During Session:  WFL for tasks performed    Extremity/Trunk Assessment Right Upper Extremity Assessment RUE ROM/Strength/Tone: [facility] for tasks assessed Left Upper Extremity Assessment LUE ROM/Strength/Tone: WFL for tasks assessed Right Lower Extremity Assessment RLE ROM/Strength/Tone: Deficits RLE ROM/Strength/Tone Deficits: 2+/5 hip strength wtih AAROm at hip to 80 flex and 20 abd Left Lower Extremity Assessment LLE ROM/Strength/Tone: WFL for tasks assessed   Balance    End of Session PT - End of Session Equipment Utilized During Treatment: Gait belt Activity Tolerance: Patient tolerated treatment well Patient left: in bed;with call bell/phone within reach;with nursing in room;with family/visitor present Nurse Communication: Mobility status  GP     Yaa Donnellan 01/10/2013, 5:23 PM

## 2013-01-10 NOTE — Op Note (Signed)
[name], NAU                ACCOUNT NO.:  [id number]  MEDICAL RECORD NO.:  [id number]  LOCATION:  1614                         FACILITY:  [facility]  PHYSICIAN:  Amber Roman, M.D.DATE OF BIRTH:  09/30/1946  DATE OF PROCEDURE:  01/10/2013 DATE OF DISCHARGE:                              OPERATIVE REPORT   PREOPERATIVE DIAGNOSES:  End-stage arthritis and degenerative joint disease, right hip.  POSTOPERATIVE DIAGNOSES:  End-stage arthritis and degenerative joint disease, right hip.  PROCEDURE:  Right total hip arthroplasty through direct anterior approach.  IMPLANTS:  Amber Roman Sector Gription acetabular component size 48, size 32+ 4 neutral polyethylene liner, size 9 Corail femoral component with standard offset, size 32+ 1 ceramic hip ball.  SURGEON:  Amber Roman, M.D.  ANESTHESIA:  Spinal.  ANTIBIOTICS:  2 g IV Ancef.  BLOOD LOSS:  200 mL.  COMPLICATIONS:  None.  INDICATIONS:  [name] is a very pleasant 67 year old active individual with end-stage arthritis involving her right hip.  She also has hip disorder in the left eye due to the failure of conservative treatment.  She wished to proceed with a total hip arthroplasty through direct anterior approach.  Risks and benefits of the surgery were explained to her in detail and she does wish to proceed.  PROCEDURE DESCRIPTION:  After informed consent was obtained, appropriate right hip was marked.  She was brought to the operating room and spinal anesthesia was obtained.  While she was on the stretcher, she was laid in supine position.  Foley catheter was placed and both feet had traction boots applied on to them.  She was then placed supine on the Hana fracture table with the perineal post in place and both legs in inline skeletal traction, but no traction applied.  We then assessed both hips under direct fluoroscopy and was able to obtain the center of the pelvis in good views of the hips with  the backed out C-arm and prepped the right hip with DuraPrep sterile drapes.  Time-out was called identifying the correct patient, correct right hip, I then made an incision just posterior and inferior to the anterosuperior iliac spine and carried this obliquely down the leg.  I dissected down to the tensor fascia and the tensor fascia was then divided longitudinally and proceeded with a direct anterior approach to the hip.  Cobra retractor was placed around the lateral neck and one up underneath the rectus femoris under the medial neck of the proximal femur.  I then cauterized the lateral femoral circumflex vessels and then divided the hip capsule and put the Cobra retractors within the hip capsule.  Next, I verified my neck cut under direct fluoroscopy and made my neck cut under direct visualization as well.  With an oscillating saw and finished this off with an osteotome.  I placed a corkscrew guide in the femoral head and removed this in its entirety and found it to be devoid of cartilage.  I then cleaned the acetabular debris and remnants of the labrum and placed a bent Hohmann medially and a Cobra retractor laterally.  I released the transverse acetabular ligament, and cleaned debris within the deep acetabulum.  I then began  reaming from size 42 reamer in 2 mm increments all the way up to size 48 with all reamers placed under direct visualization and last reamer placed also under direct fluoroscopy, so I could obtain my depth of my inclination and anteversion.  I then chose a size 48 diffuse Sector acetabular component and placed this in the acetabulum under direct visualization as well as direct fluoroscopy. Next, I placed the 32+ 4 neutral polyethylene liner.  Attention was then turned to the femur with the leg externally rotated to 90 degrees, extended and adducted.  I was able to gain access to the femoral canal. I placed a Mueller retractor medially and a Cobra retractor behind  the greater trochanter.  I released the lateral capsule and then used a box cutting guide to interrupt the femoral canal.  I then began broaching with a size 8 to a size 9 broach.  The size 9 broach felt to fill the canal.  It was felt to be stable.  I trialed a standard neck and a 32+ 1 hip ball.  We brought the leg back over and up and with traction and internal rotation reduced the hip, it was stable with internal and external rotation with minimal shuck and her leg lengths were measured to be exactly equal under direct fluoroscopy.  We then dislocated the hip and brought the leg back down.  We removed the trial components and placed the real Corail femoral component size 9 with standard offset and the real 32+ 1 ceramic hip ball.  We brought the leg back over and up and reduced in the acetabulum, again it was stable.  I then used 1 L normal saline solution and pulsatile lavage to lavage the joint.  I closed the joint capsule with interrupted #1 Ethibond suture followed by a running 0 V-Loc to the tensor fascia lata, 2-0 Vicryl in the deep and subcutaneous tissue and 4-0 Monocryl subcuticular stitch and Dermabond and Aquacel dressing was then applied.  She was taken off the Hana table into the recovery room in stable condition.  All final counts were correct.  There were no complications noted.     Amber Roman, M.D.     CYB/MEDQ  D:  01/10/2013  T:  01/10/2013  Job:  [id number]

## 2013-01-10 NOTE — Anesthesia Preprocedure Evaluation (Addendum)
Anesthesia Evaluation  Patient identified by MRN, date of birth, ID band Patient awake    Reviewed: Allergy & Precautions, H&P , NPO status , Patient's Chart, lab work & pertinent test results  Airway Mallampati: II TM Distance: >3 FB Neck ROM: Full    Dental No notable dental hx.    Pulmonary neg pulmonary ROS,  breath sounds clear to auscultation  Pulmonary exam normal       Cardiovascular hypertension, Pt. on medications negative cardio ROS  Rhythm:Regular Rate:Normal     Neuro/Psych negative neurological ROS  negative psych ROS   GI/Hepatic negative GI ROS, Neg liver ROS,   Endo/Other  negative endocrine ROS  Renal/GU negative Renal ROS  negative genitourinary   Musculoskeletal negative musculoskeletal ROS (+)   Abdominal   Peds negative pediatric ROS (+)  Hematology negative hematology ROS (+)   Anesthesia Other Findings   Reproductive/Obstetrics negative OB ROS                          Anesthesia Physical Anesthesia Plan  ASA: II  Anesthesia Plan: Spinal   Post-op Pain Management:    Induction:   Airway Management Planned: Simple Face Mask  Additional Equipment:   Intra-op Plan:   Post-operative Plan:   Informed Consent: I have reviewed the patients History and Physical, chart, labs and discussed the procedure including the risks, benefits and alternatives for the proposed anesthesia with the patient or authorized representative who has indicated his/her understanding and acceptance.   Dental advisory given  Plan Discussed with: CRNA and Surgeon  Anesthesia Plan Comments:         Anesthesia Quick Evaluation

## 2013-01-10 NOTE — H&P (Signed)
TOTAL HIP ADMISSION H&P  Patient is admitted for right total hip arthroplasty.  Subjective:  Chief Complaint: right hip pain  HPI: Amber Roman, 67 y.o. female, has a history of pain and functional disability in the right hip(s) due to arthritis and patient has failed non-surgical conservative treatments for greater than 12 weeks to include NSAID's and/or analgesics, flexibility and strengthening excercises, use of assistive devices, weight reduction as appropriate and activity modification.  Onset of symptoms was gradual starting 1 years ago with gradually worsening course since that time.The patient noted no past surgery on the right hip(s).  Patient currently rates pain in the right hip at 8 out of 10 with activity. Patient has night pain, worsening of pain with activity and weight bearing, pain that interfers with activities of daily living, pain with passive range of motion and crepitus. Patient has evidence of subchondral sclerosis, periarticular osteophytes and joint space narrowing by imaging studies. This condition presents safety issues increasing the risk of falls.  There is no current active infection.  Patient Active Problem List   Diagnosis Date Noted  . Degenerative arthritis of hip 01/10/2013  . RHINOCONJUNCTIVITIS 04/21/2010  . HYPERLIPIDEMIA 05/20/2009  . HYPERTENSION 05/20/2009  . ALLERGIC CONJUNCTIVITIS 05/20/2008  . Asthmatic bronchitis 11/19/2007  . ESOPHAGEAL REFLUX 11/19/2007   Past Medical History  Diagnosis Date  . Allergic conjunctivitis   . Esophageal reflux   . Bronchitis   . Uterine cancer     hysterectomy  . Hyperlipidemia   . Hypertension   . Depression   . Arthritis     Past Surgical History  Procedure Date  . Nasal septum surgery   . Total abdominal hysterectomy   . Bilateral salpingoophorectomy   . Carpal tunnel release     bilateral    Prescriptions prior to admission  Medication Sig Dispense Refill  . aspirin 81 MG tablet Take 81 mg by  mouth daily. Patient takes in the am      . diphenoxylate-atropine (LOMOTIL) 2.5-0.025 MG per tablet Take 1 tablet by mouth 4 (four) times daily as needed. For stomach      . DULoxetine (CYMBALTA) 30 MG capsule Take 30 mg by mouth at bedtime. Take with 60 mg to make a 90 mg daily dose      . DULoxetine (CYMBALTA) 60 MG capsule Take 60 mg by mouth every morning. Take with 30 mg to make a 90 mg daily dose      . estradiol (VAGIFEM) 25 MCG vaginal tablet Place 25 mcg vaginally 2 (two) times a week.       . fluticasone (FLONASE) 50 MCG/ACT nasal spray Place 2 sprays into the nose daily. As needed      . Fluticasone-Salmeterol (ADVAIR) 100-50 MCG/DOSE AEPB Inhale 1 puff into the lungs 2 (two) times daily as needed. For shortness of breath      . guaiFENesin (MUCINEX) 600 MG 12 hr tablet Take 1,200 mg by mouth 2 (two) times daily as needed. For chest tightness      . irbesartan (AVAPRO) 300 MG tablet Take 1 tablet by mouth at bedtime.       . nebivolol (BYSTOLIC) 10 MG tablet Take 20 mg by mouth at bedtime.       [name] olopatadine (PATANOL) 0.1 % ophthalmic solution Place 1 drop into both eyes 2 (two) times daily as needed. For allergies      . omeprazole (PRILOSEC OTC) 20 MG tablet Take 20 mg by mouth every morning.       [name]  OVER THE COUNTER MEDICATION Vitamin B6 100mg  daily in the am      . Probiotic Product (ALIGN) 4 MG CAPS Take 4 mg by mouth every morning.      . rosuvastatin (CRESTOR) 20 MG tablet Take 20 mg by mouth at bedtime.       [name] zolpidem (AMBIEN) 10 MG tablet Take 10 mg by mouth at bedtime as needed. For sleep       Allergies  Allergen Reactions  . Sulfonamide Derivatives Other (See Comments)    Childhood allergy     History  Substance Use Topics  . Smoking status: Former Smoker -- 2.0 packs/day for 15 years    Types: Cigarettes    Quit date: 12/18/1968  . Smokeless tobacco: Not on file  . Alcohol Use: No    Family History  Problem Relation Age of Onset  . Heart attack Father        Review of Systems  All other systems reviewed and are negative.    Objective:  Physical Exam  Constitutional: She is oriented to person, place, and time. She appears well-developed and well-nourished.  HENT:  Head: Normocephalic and atraumatic.  Eyes: EOM are normal. Pupils are equal, round, and reactive to light.  Neck: Normal range of motion. Neck supple.  Cardiovascular: Normal rate and regular rhythm.   Respiratory: Effort normal and breath sounds normal.  GI: Soft. Bowel sounds are normal.  Musculoskeletal:       Right hip: She exhibits decreased range of motion, bony tenderness and crepitus.  Neurological: She is alert and oriented to person, place, and time.  Skin: Skin is warm and dry.  Psychiatric: She has a normal mood and affect.    Vital signs in last 24 hours: Temp:  [97.7 F (36.5 C)] 97.7 F (36.5 C) (01/24 0544) Pulse Rate:  [70] 70  (01/24 0544) Resp:  [18] 18  (01/24 0544) BP: (174)/(76) 174/76 mmHg (01/24 0544) SpO2:  [98 %] 98 % (01/24 0544)  Labs:   Estimated Body mass index is 29.82 kg/(m^2) as calculated from the following:   Height as of 03/28/12: 5\' 1" (1.549 m).   Weight as of 03/28/12: 157 lb 12.8 oz(71.578 kg).   Imaging Review Plain radiographs demonstrate moderate degenerative joint disease of the right hip(s). The bone quality appears to be good for age and reported activity level.  Assessment/Plan:  End stage arthritis, right hip(s)  The patient history, physical examination, clinical judgement of the provider and imaging studies are consistent with end stage degenerative joint disease of the right hip(s) and total hip arthroplasty is deemed medically necessary. The treatment options including medical management, injection therapy, arthroscopy and arthroplasty were discussed at length. The risks and benefits of total hip arthroplasty were presented and reviewed. The risks due to aseptic loosening, infection, stiffness,  dislocation/subluxation,  thromboembolic complications and other imponderables were discussed.  The patient acknowledged the explanation, agreed to proceed with the plan and consent was signed. Patient is being admitted for inpatient treatment for surgery, pain control, PT, OT, prophylactic antibiotics, VTE prophylaxis, progressive ambulation and ADL's and discharge planning.The patient is planning to be discharged home with home health services

## 2013-01-10 NOTE — Anesthesia Procedure Notes (Signed)
Spinal  Start time: 01/10/2013 7:10 AM Staffing Anesthesiologist: Phillips Grout CRNA/Resident: [place], Kooper Godshall C Performed by: anesthesiologist and resident/CRNA  Preanesthetic Checklist Completed: patient identified, site marked, surgical consent, pre-op evaluation, timeout performed, IV checked, risks and benefits discussed and monitors and equipment checked Spinal Block Patient position: sitting Prep: Betadine Patient monitoring: heart rate, continuous pulse ox, cardiac monitor and blood pressure Approach: left paramedian Location: L3-4 Injection technique: single-shot Needle Needle gauge: 22 G Assessment Sensory level: T6

## 2013-01-10 NOTE — Transfer of Care (Signed)
Immediate Anesthesia Transfer of Care Note  Patient: Amber Roman  Procedure(s) Performed: Procedure(s) (LRB) with comments: TOTAL HIP ARTHROPLASTY ANTERIOR APPROACH (Right)  Patient Location: PACU  Anesthesia Type:Spinal  Level of Consciousness: awake and alert   Airway & Oxygen Therapy: Patient Spontanous Breathing and Patient connected to face mask oxygen  Post-op Assessment: Report given to PACU RN and Post -op Vital signs reviewed and stable  Post vital signs: Reviewed and stable  Complications: No apparent anesthesia complications

## 2013-01-10 NOTE — Brief Op Note (Signed)
01/10/2013  9:44 AM  PATIENT:  Amber Roman  67 y.o. female  PRE-OPERATIVE DIAGNOSIS:  severe osteoarthritis right hip  POST-OPERATIVE DIAGNOSIS:  severe osteoarthritis right hip  PROCEDURE:  Procedure(s) (LRB) with comments: TOTAL HIP ARTHROPLASTY ANTERIOR APPROACH (Right)  SURGEON:  Surgeon(s) and Role:    * Kathryne Hitch, MD - Primary  PHYSICIAN ASSISTANT:   ASSISTANTS: none   ANESTHESIA:   spinal  EBL:  Total I/O In: 1600 [I.V.:1600] Out: 300 [Urine:100; Blood:200]  BLOOD ADMINISTERED:none  DRAINS: none and Gastrostomy Tube   LOCAL MEDICATIONS USED:  NONE  SPECIMEN:  No Specimen  DISPOSITION OF SPECIMEN:  N/A  COUNTS:  YES  TOURNIQUET:  * No tourniquets in log *  DICTATION: .Other Dictation: Dictation Number [id number]  PLAN OF CARE: Admit to inpatient   PATIENT DISPOSITION:  PACU - hemodynamically stable.   Delay start of Pharmacological VTE agent (>24hrs) due to surgical blood loss or risk of bleeding: no

## 2013-01-10 NOTE — Anesthesia Postprocedure Evaluation (Signed)
  Anesthesia Post-op Note  Patient: Amber Roman  Procedure(s) Performed: Procedure(s) (LRB): TOTAL HIP ARTHROPLASTY ANTERIOR APPROACH (Right)  Patient Location: PACU  Anesthesia Type: Spinal  Level of Consciousness: awake and alert   Airway and Oxygen Therapy: Patient Spontanous Breathing  Post-op Pain: mild  Post-op Assessment: Post-op Vital signs reviewed, Patient's Cardiovascular Status Stable, Respiratory Function Stable, Patent Airway and No signs of Nausea or vomiting  Last Vitals:  Filed Vitals:   01/10/13 1053  BP: 159/75  Pulse: 54  Temp: 36.4 C  Resp: 16    Post-op Vital Signs: stable   Complications: No apparent anesthesia complications

## 2013-01-11 LAB — BASIC METABOLIC PANEL
CO2: 28 mEq/L (ref 19–32)
Calcium: 8.8 mg/dL (ref 8.4–10.5)
Chloride: 103 mEq/L (ref 96–112)
Potassium: 3.9 mEq/L (ref 3.5–5.1)
Sodium: 140 mEq/L (ref 135–145)

## 2013-01-11 LAB — CBC
MCH: 28.1 pg (ref 26.0–34.0)
Platelets: 302 10*3/uL (ref 150–400)
RBC: 3.81 MIL/uL — ABNORMAL LOW (ref 3.87–5.11)
WBC: 15.2 10*3/uL — ABNORMAL HIGH (ref 4.0–10.5)

## 2013-01-11 MED ORDER — ASPIRIN 325 MG PO TBEC: 325.0000 mg | DELAYED_RELEASE_TABLET | Freq: Two times a day (BID) | ORAL | Status: DC

## 2013-01-11 MED ORDER — METHOCARBAMOL 500 MG PO TABS: 500.0000 mg | ORAL_TABLET | Freq: Four times a day (QID) | ORAL | Status: DC | PRN

## 2013-01-11 MED ORDER — OXYCODONE-ACETAMINOPHEN 5-325 MG PO TABS: 1.0000 | ORAL_TABLET | ORAL | Status: DC | PRN

## 2013-01-11 NOTE — Progress Notes (Signed)
Physical Therapy Treatment Patient Details Name: Amber Roman MRN: [id number] DOB: 1946/12/05 Today's Date: 01/11/2013 Time: 5284-1324 PT Time Calculation (min): 18 min  PT Assessment / Plan / Recommendation Comments on Treatment Session       Follow Up Recommendations  Home health PT     Does the patient have the potential to tolerate intense rehabilitation     Barriers to Discharge        Equipment Recommendations  Rolling walker with 5" wheels    Recommendations for Other Services OT consult  Frequency 7X/week   Plan Discharge plan remains appropriate    Precautions / Restrictions Precautions Precautions: Fall Restrictions Weight Bearing Restrictions: No Other Position/Activity Restrictions: WBAT   Pertinent Vitals/Pain     Mobility  Bed Mobility Bed Mobility: Supine to Sit Supine to Sit: 4: Min guard;HOB elevated Sit to Supine: 4: Min assist;HOB elevated Transfers Transfers: Sit to Stand;Stand to Sit Sit to Stand: 4: Min guard Stand to Sit: 4: Min guard Details for Transfer Assistance: cues for use of UEs and for LE management Ambulation/Gait Ambulation/Gait Assistance: 4: Min guard;4: Min Environmental consultant (Feet): 200 Feet Assistive device: Rolling walker Ambulation/Gait Assistance Details: cues for pace, posture and position from RW Gait Pattern: Step-to pattern;Step-through pattern General Gait Details: pt progressed to recip gait    Exercises     PT Diagnosis:    PT Problem List:   PT Treatment Interventions:     PT Goals Acute Rehab PT Goals PT Goal Formulation: With patient Time For Goal Achievement: 01/15/13 Potential to Achieve Goals: Good Pt will go Supine/Side to Sit: with supervision PT Goal: Supine/Side to Sit - Progress: Progressing toward goal Pt will go Sit to Supine/Side: with supervision PT Goal: Sit to Supine/Side - Progress: Progressing toward goal Pt will go Sit to Stand: with supervision PT Goal: Sit to Stand -  Progress: Progressing toward goal Pt will go Stand to Sit: with supervision PT Goal: Stand to Sit - Progress: Progressing toward goal Pt will Ambulate: 51 - 150 feet;with supervision;with rolling walker PT Goal: Ambulate - Progress: Progressing toward goal Pt will Go Up / Down Stairs: 1-2 stairs;with min assist;with least restrictive assistive device  Visit Information  Last PT Received On: 01/11/13 Assistance Needed: +1    Subjective Data  Subjective: It feels strange to walk because my legs have not been the same length for so long Patient Stated Goal: Resume previous lifestyle with decreased pain   Cognition  Overall Cognitive Status: Appears within functional limits for tasks assessed/performed Arousal/Alertness: Awake/alert Orientation Level: Appears intact for tasks assessed Behavior During Session: [facility] for tasks performed    Balance     End of Session PT - End of Session Activity Tolerance: Patient tolerated treatment well Patient left: in chair;with call bell/phone within reach;with family/visitor present Nurse Communication: Mobility status   GP     Amber Roman 01/11/2013, 4:46 PM

## 2013-01-11 NOTE — Progress Notes (Signed)
Physical Therapy Treatment Patient Details Name: Amber Roman MRN: [id number] DOB: 02/03/1946 Today's Date: 01/11/2013 Time: 4098-1191 PT Time Calculation (min): 34 min  PT Assessment / Plan / Recommendation Comments on Treatment Session       Follow Up Recommendations  Home health PT     Does the patient have the potential to tolerate intense rehabilitation     Barriers to Discharge        Equipment Recommendations  Rolling walker with 5" wheels    Recommendations for Other Services OT consult  Frequency 7X/week   Plan Discharge plan remains appropriate    Precautions / Restrictions Precautions Precautions: Fall Restrictions Weight Bearing Restrictions: No Other Position/Activity Restrictions: WBAT   Pertinent Vitals/Pain 4/10; premed, ice pack provided    Mobility  Bed Mobility Bed Mobility: Supine to Sit Supine to Sit: 4: Min guard;HOB elevated Details for Bed Mobility Assistance: cues for sequence with assist for R LE Transfers Transfers: Sit to Stand;Stand to Sit Sit to Stand: 4: Min assist Stand to Sit: 4: Min assist Details for Transfer Assistance: cues for use of UEs and for LE management Ambulation/Gait Ambulation/Gait Assistance: 4: Min assist Ambulation Distance (Feet): 98 Feet Assistive device: Rolling walker Ambulation/Gait Assistance Details: cues for sequence, posture, position from RW Gait Pattern: Step-to pattern;Step-through pattern General Gait Details: pt progressed to recip gait Stairs: No    Exercises Total Joint Exercises Ankle Circles/Pumps: AROM;Both;Supine;20 reps Quad Sets: AROM;Both;10 reps;Supine Gluteal Sets: AROM;Both;10 reps;Supine Heel Slides: AAROM;Supine;Right;15 reps Hip ABduction/ADduction: AAROM;Supine;Right;15 reps   PT Diagnosis:    PT Problem List:   PT Treatment Interventions:     PT Goals Acute Rehab PT Goals PT Goal Formulation: With patient Time For Goal Achievement: 01/15/13 Potential to Achieve Goals:  Good Pt will go Supine/Side to Sit: with supervision PT Goal: Supine/Side to Sit - Progress: Progressing toward goal Pt will go Sit to Supine/Side: with supervision PT Goal: Sit to Supine/Side - Progress: Progressing toward goal Pt will go Sit to Stand: with supervision PT Goal: Sit to Stand - Progress: Progressing toward goal Pt will go Stand to Sit: with supervision PT Goal: Stand to Sit - Progress: Progressing toward goal Pt will Ambulate: 51 - 150 feet;with supervision;with rolling walker PT Goal: Ambulate - Progress: Progressing toward goal  Visit Information  Last PT Received On: 01/11/13 Assistance Needed: +1    Subjective Data  Subjective: I want to get moving and get home Patient Stated Goal: Resume previous lifestyle with decreased pain   Cognition  Overall Cognitive Status: Appears within functional limits for tasks assessed/performed Arousal/Alertness: Awake/alert Orientation Level: Appears intact for tasks assessed Behavior During Session: [facility] for tasks performed    Balance     End of Session PT - End of Session Activity Tolerance: Patient tolerated treatment well Patient left: with call bell/phone within reach;with nursing in room;with family/visitor present;in chair Nurse Communication: Mobility status   GP     Jeanne Terrance 01/11/2013, 12:51 PM

## 2013-01-11 NOTE — Progress Notes (Signed)
Subjective: 1 Day Post-Op Procedure(s) (LRB): TOTAL HIP ARTHROPLASTY ANTERIOR APPROACH (Right) Patient reports pain as mild.    " I walked up and down the hall, I want to go home tomorrow"  Objective: Vital signs in last 24 hours: Temp:  [97.2 F (36.2 C)-98.4 F (36.9 C)] 97.6 F (36.4 C) (01/25 0651) Pulse Rate:  [54-83] 75  (01/25 0651) Resp:  [13-19] 18  (01/25 0651) BP: (117-176)/(30-81) 176/76 mmHg (01/25 0651) SpO2:  [96 %-100 %] 99 % (01/25 0651) Weight:  [70.761 kg (156 lb)] 70.761 kg (156 lb) (01/24 1110)  Intake/Output from previous day: 01/24 0701 - 01/25 0700 In: 3207.5 [P.O.:1080; I.V.:2022.5; IV Piggyback:105] Out: 3150 [Urine:2950; Blood:200] Intake/Output this shift:     Basename 01/11/13 0444  HGB 10.7*    Basename 01/11/13 0444  WBC 15.2*  RBC 3.81*  HCT 32.1*  PLT 302    Basename 01/11/13 0444  NA 140  K 3.9  CL 103  CO2 28  BUN 11  CREATININE 0.52  GLUCOSE 102*  CALCIUM 8.8   No results found for this basename: LABPT:2,INR:2 in the last 72 hours  Neurologically intact Incision: dressing C/D/I  Assessment/Plan: 1 Day Post-Op Procedure(s) (LRB): TOTAL HIP ARTHROPLASTY ANTERIOR APPROACH (Right) Plan:    Likely home Sunday  YATES,MARK C 01/11/2013, 9:43 AM

## 2013-01-11 NOTE — Evaluation (Signed)
Occupational Therapy Evaluation Patient Details Name: Amber Roman MRN: [id number] DOB: 1946/06/05 Today's Date: 01/11/2013 Time: 4098-1191 OT Time Calculation (min): 17 min  OT Assessment / Plan / Recommendation Clinical Impression  Pt is recovering from R direct anterior THA.  She is moving well for post op day one.  All education completed.  Pt will have reliable assistance upon d/c home.  Pt has access to a 3 in1 for home.  Will need RW.  No further OT needs.    OT Assessment  Patient does not need any further OT services    Follow Up Recommendations  No OT follow up    Barriers to Discharge      Equipment Recommendations  None recommended by OT    Recommendations for Other Services    Frequency       Precautions / Restrictions Precautions Precautions: Fall Restrictions Weight Bearing Restrictions: No Other Position/Activity Restrictions: WBAT   Pertinent Vitals/Pain 3/10, R hip, medicated by RN, iced    ADL  Eating/Feeding: Independent Where Assessed - Eating/Feeding: Bed level Grooming: Wash/dry hands;Supervision/safety Where Assessed - Grooming: Unsupported standing Upper Body Bathing: Set up Where Assessed - Upper Body Bathing: Unsupported sitting Lower Body Bathing: Minimal assistance Where Assessed - Lower Body Bathing: Unsupported sitting;Supported sit to stand Upper Body Dressing: Set up Where Assessed - Upper Body Dressing: Unsupported sitting Lower Body Dressing: Minimal assistance Where Assessed - Lower Body Dressing: Unsupported sitting;Supported sit to stand Toilet Transfer: Supervision/safety Toilet Transfer Method: Sit to Barista: Raised toilet seat with arms (or 3-in-1 over toilet) Toileting - Clothing Manipulation and Hygiene: Modified independent Where Assessed - Glass blower/designer Manipulation and Hygiene: Standing Equipment Used: Rolling walker;Long-handled shoe horn;Long-handled sponge;Reacher;Sock aid ADL Comments:  Educated pt on use of AE for LE ADL.  Pt will have friend available to assist with tub transfer and LB ADL until pt can perform independently.  Instructed pt to practice tub transfer with HHPT prior to showering next Wednesday.    OT Diagnosis:    OT Problem List:   OT Treatment Interventions:     OT Goals    Visit Information  Last OT Received On: 01/11/13 Assistance Needed: +1    Subjective Data  Subjective: "I have a plastic seat from [employer] that will be perfect for a tub seat." Patient Stated Goal: Home with assist of her family.   Prior Functioning     Home Living Lives With: Friend(s) Available Help at Discharge: Family;Friend(s) Type of Home: House Home Access: Stairs to enter [employer] of Steps: 2 Entrance Stairs-Rails: None Home Layout: Able to live on main level with bedroom/bathroom Bathroom Shower/Tub: Tub/shower unit;Curtain Bathroom Toilet: Standard Home Adaptive Equipment: Bedside commode/3-in-1 Additional Comments: Pt can borrow 3 in1 from sister.  Needs a RW. Prior Function Level of Independence: Independent Able to Take Stairs?: Yes Driving: Yes Vocation: Retired Musician: No difficulties Dominant Hand: Right         Vision/Perception     Cognition  Overall Cognitive Status: Appears within functional limits for tasks assessed/performed Arousal/Alertness: Awake/alert Orientation Level: Appears intact for tasks assessed Behavior During Session: [facility] for tasks performed    Extremity/Trunk Assessment Right Upper Extremity Assessment RUE ROM/Strength/Tone: [facility] for tasks assessed Left Upper Extremity Assessment LUE ROM/Strength/Tone: WFL for tasks assessed Trunk Assessment Trunk Assessment: Normal     Mobility Bed Mobility Bed Mobility: Supine to Sit Supine to Sit: 4: Min guard;HOB elevated Sit to Supine: 4: Min assist;HOB elevated Transfers Sit to  Stand: 4: Min assist Stand to Sit: 4: Min guard       Shoulder Instructions     Exercise     Balance     End of Session OT - End of Session Activity Tolerance: Patient tolerated treatment well Patient left: in bed;with call bell/phone within reach;with nursing in room Nurse Communication: Patient requests pain meds  GO     Evern Bio 01/11/2013, 3:07 PM [phone]

## 2013-01-12 LAB — CBC
HCT: 30.1 % — ABNORMAL LOW (ref 36.0–46.0)
Hemoglobin: 10 g/dL — ABNORMAL LOW (ref 12.0–15.0)
MCH: 27.9 pg (ref 26.0–34.0)
MCHC: 33.2 g/dL (ref 30.0–36.0)
MCV: 84.1 fL (ref 78.0–100.0)
RDW: 13 % (ref 11.5–15.5)

## 2013-01-12 NOTE — Discharge Instructions (Addendum)
Home Health  Services arranged with [facility]. [phone]. Physical Therapy- Durable Medical Equipment via [facility]. Rolling Environmental consultant

## 2013-01-12 NOTE — Discharge Summary (Signed)
Physician Discharge Summary  Patient ID: Amber Roman MRN: [id number] DOB/AGE: [birth year] 67 y.o.  Admit date: 01/10/2013 Discharge date: 01/12/2013  Admission Diagnoses:right hip OA  Discharge Diagnoses: same Principal Problem:  *Degenerative arthritis of hip   Discharged Condition: good  Hospital Course: underwent above procedure, stable post op course, did well with PT. WBAT  Consults: None  Significant Diagnostic Studies:   Treatments: surgery: above  Discharge Exam: Blood pressure 156/71, pulse 93, temperature 98.7 F (37.1 C), temperature source Oral, resp. rate 16, height 5\' 1"  (1.549 m), weight 70.761 kg (156 lb), SpO2 98.00%. Incision/Wound:clean and dry.    Disposition:   Discharge Orders    Future Appointments: Provider: Department: Dept Phone: Center:   06/23/2013 2:00 PM Waymon Budge, MD [facility] [phone] None     Future Orders Please Complete By Expires   Diet - low sodium heart healthy      Call MD / Call 911      Comments:   If you experience chest pain or shortness of breath, CALL 911 and be transported to the hospital emergency room.  If you develope a fever above 101 F, pus (white drainage) or increased drainage or redness at the wound, or calf pain, call your surgeon's office.   Constipation Prevention      Comments:   Drink plenty of fluids.  Prune juice may be helpful.  You may use a stool softener, such as Colace (over the counter) 100 mg twice a day.  Use MiraLax (over the counter) for constipation as needed.   Increase activity slowly as tolerated      Discharge instructions      Comments:   Increase your activities as comfort allows. Expect right thigh, leg and foot swelling. Leave your current dressing in place until Wed 1/29. Starting Wed, you can remove your dressing and shower; then daily dry dressing.       Medication List     As of 01/12/2013  8:55 AM    STOP taking these medications         aspirin 81 MG  tablet      TAKE these medications         ALIGN 4 MG Caps   Take 4 mg by mouth every morning.      aspirin 325 MG EC tablet   Take 1 tablet (325 mg total) by mouth 2 (two) times daily.      diphenoxylate-atropine 2.5-0.025 MG per tablet   Commonly known as: LOMOTIL   Take 1 tablet by mouth 4 (four) times daily as needed. For stomach      DULoxetine 60 MG capsule   Commonly known as: CYMBALTA   Take 60 mg by mouth every morning. Take with 30 mg to make a 90 mg daily dose      CYMBALTA 30 MG capsule   Generic drug: DULoxetine   Take 30 mg by mouth at bedtime. Take with 60 mg to make a 90 mg daily dose      estradiol 25 MCG vaginal tablet   Commonly known as: VAGIFEM   Place 25 mcg vaginally 2 (two) times a week.      fluticasone 50 MCG/ACT nasal spray   Commonly known as: FLONASE   Place 2 sprays into the nose daily. As needed      Fluticasone-Salmeterol 100-50 MCG/DOSE Aepb   Commonly known as: ADVAIR   Inhale 1 puff into the lungs 2 (two) times daily as needed. For  shortness of breath      irbesartan 300 MG tablet   Commonly known as: AVAPRO   Take 1 tablet by mouth at bedtime.      methocarbamol 500 MG tablet   Commonly known as: ROBAXIN   Take 1 tablet (500 mg total) by mouth every 6 (six) hours as needed.      MUCINEX 600 MG 12 hr tablet   Generic drug: guaiFENesin   Take 1,200 mg by mouth 2 (two) times daily as needed. For chest tightness      nebivolol 10 MG tablet   Commonly known as: BYSTOLIC   Take 20 mg by mouth at bedtime.      olopatadine 0.1 % ophthalmic solution   Commonly known as: PATANOL   Place 1 drop into both eyes 2 (two) times daily as needed. For allergies      omeprazole 20 MG tablet   Commonly known as: PRILOSEC OTC   Take 20 mg by mouth every morning.      OVER THE COUNTER MEDICATION   Vitamin B6 100mg  daily in the am      oxyCODONE-acetaminophen 5-325 MG per tablet   Commonly known as: PERCOCET/ROXICET   Take 1-2 tablets by  mouth every 4 (four) hours as needed for pain.      rosuvastatin 20 MG tablet   Commonly known as: CRESTOR   Take 20 mg by mouth at bedtime.      zolpidem 10 MG tablet   Commonly known as: AMBIEN   Take 10 mg by mouth at bedtime as needed. For sleep           Follow-up Information    Follow up with Kathryne Hitch, MD. In 2 weeks.   Contact information:   [address] [place] [address] [phone]          Signed: Eldred Manges 01/12/2013, 8:55 AM

## 2013-01-12 NOTE — Care Management Note (Addendum)
  Page 2 of 2   01/12/2013     11:56:51 AM   CARE MANAGEMENT NOTE 01/12/2013  Patient:  Amber Roman, Amber Roman   Account Number:  [id number]  Date Initiated:  01/12/2013  Documentation initiated by:  GRAVES-BIGELOW,Chelby Salata  Subjective/Objective Assessment:   Pt Pt s/p R THR.     Action/Plan:   Plan fo rhome today with HHPT. CM spoke to pt and plan is for [facility] PT to be provided by [facility].   Anticipated DC Date:  01/12/2013   Anticipated DC Plan:  HOME W HOME HEALTH SERVICES      DC Planning Services  CM consult      PAC Choice  DURABLE MEDICAL EQUIPMENT  HOME HEALTH   Choice offered to / List presented to:  C-1 Patient   DME arranged  Levan Hurst      DME agency  [facility]     HH arranged  HH-2 PT      [facility] agency  [facility]   Status of service:  Completed, signed off Medicare Important Message given?   (If response is "NO", the following Medicare IM given date fields will be blank) Date Medicare IM given:   Date Additional Medicare IM given:    Discharge Disposition:  HOME W HOME HEALTH SERVICES  Per UR Regulation:  Reviewed for med. necessity/level of care/duration of stay  If discussed at Long Length of Stay Meetings, dates discussed:    Comments:  Referral faxed into [facility] office. SOC to begin within 24-48 hours post d/c.

## 2013-01-12 NOTE — Progress Notes (Signed)
Pt stable, scripts, and d/c instructions given with no questions/concerns voiced by pt or female family member.  Pt transported via wheelchair to private vehicle with NT and female family member.

## 2013-01-12 NOTE — Progress Notes (Signed)
Physical Therapy Treatment Patient Details Name: ANNABELLE REXROAD MRN: [id number] DOB: 1946-08-12 Today's Date: 01/12/2013 Time: 1040-1105 PT Time Calculation (min): 25 min  PT Assessment / Plan / Recommendation Comments on Treatment Session  POD #2 Anterior Approach THR planning to D/C to home today.  Pt progressing well.  Practiced stairs with pt and friend as well as given handouts.     Follow Up Recommendations  Home health PT     Does the patient have the potential to tolerate intense rehabilitation     Barriers to Discharge        Equipment Recommendations  Rolling walker with 5" wheels    Recommendations for Other Services    Frequency 7X/week   Plan Discharge plan remains appropriate    Precautions / Restrictions Precautions Precautions: None Precaution Comments: Direct Anterior Approach THR Restrictions Weight Bearing Restrictions: No Other Position/Activity Restrictions: WBAT   Pertinent Vitals/Pain C/o 6/10 after session meds requested ICE applied    Mobility  Bed Mobility Bed Mobility: Not assessed Details for Bed Mobility Assistance: Pt OOB in recliner Transfers Transfers: Sit to Stand;Stand to Sit Sit to Stand: 5: Supervision;From chair/3-in-1 Stand to Sit: 5: Supervision;To chair/3-in-1 Details for Transfer Assistance: One VC on safety with turns using RW Ambulation/Gait Ambulation/Gait Assistance: 5: Supervision Ambulation Distance (Feet): 45 Feet Assistive device: Rolling walker Ambulation/Gait Assistance Details: decreased amb distance 2nd tx session focused on stair training. Pt instructed on safety with turns and backward gait sequencing. Gait Pattern: Step-through pattern;Trunk flexed Gait velocity: decreased Stairs: Yes Stairs Assistance: 4: Min assist Stairs Assistance Details (indicate cue type and reason): performed twice with pt @ 50% VC's on proper sequencing and RW placement.  Then performed again with friend who is taking pt home as  well as given handout.  Stair Management Technique: No rails;Backwards;With walker Number of Stairs: 2      PT Goals                                          Progressing well    Visit Information  Last PT Received On: 01/12/13 Assistance Needed: +1    Subjective Data  Subjective: My friend is picking me up Patient Stated Goal: home today   Cognition    good   Balance   good w/ RW  End of Session PT - End of Session Equipment Utilized During Treatment: Gait belt Activity Tolerance: Patient tolerated treatment well Patient left: in chair;with call bell/phone within reach;with family/visitor present;Other (comment) (ICE to R hip)   Felecia Shelling  PTA [facility]  Acute  Rehab Pager      [phone]

## 2013-01-13 ENCOUNTER — Encounter (HOSPITAL_COMMUNITY): Payer: Self-pay | Admitting: Orthopaedic Surgery

## 2013-04-30 ENCOUNTER — Other Ambulatory Visit: Payer: Self-pay | Admitting: Internal Medicine

## 2013-04-30 MED ORDER — OLOPATADINE HCL 0.1 % OP SOLN: 1.0000 [drp] | Freq: Two times a day (BID) | OPHTHALMIC | Status: DC | PRN

## 2013-05-13 ENCOUNTER — Telehealth: Payer: Self-pay | Admitting: Internal Medicine

## 2013-05-13 MED ORDER — PREDNISONE 20 MG PO TABS: 20.0000 mg | ORAL_TABLET | Freq: Every day | ORAL | Status: DC

## 2013-05-13 NOTE — Telephone Encounter (Signed)
Per CY-okay to give Prednisone 20 mg #5 take  1 po qd no refills. Pt is aware of Rx and will call us next week if no better.

## 2013-05-13 NOTE — Telephone Encounter (Signed)
LMTCBx1 on cell number. I called home number and line picked up but then was silent. Carron Curie, CMA

## 2013-05-13 NOTE — Telephone Encounter (Signed)
Last OV 12-23-12. Pt is c/o having chest congestion, productive cough with clear thick phlegm, chest tightness x 1 day. Pt denies any fever or wheezing at this time. Pt states she is leaving on Friday for a wedding in [place] and does not want to be sick. Pt is ok with rx being called in. Pt states she has been using her nasal spray and inhaler and mucinex without relief. Please advise. Amber Roman, CMA Allergies  Allergen Reactions  . Sulfonamide Derivatives Other (See Comments)    Childhood allergy

## 2013-05-19 ENCOUNTER — Telehealth: Payer: Self-pay | Admitting: Internal Medicine

## 2013-05-19 ENCOUNTER — Encounter: Payer: Self-pay | Admitting: Adult Health

## 2013-05-19 ENCOUNTER — Ambulatory Visit (INDEPENDENT_AMBULATORY_CARE_PROVIDER_SITE_OTHER): Payer: Medicare Other | Admitting: Adult Health

## 2013-05-19 VITALS — BP 136/76 | HR 58 | Temp 97.5°F | Ht 62.0 in | Wt 154.2 lb

## 2013-05-19 DIAGNOSIS — J45901 Unspecified asthma with (acute) exacerbation: Secondary | ICD-10-CM

## 2013-05-19 DIAGNOSIS — J4521 Mild intermittent asthma with (acute) exacerbation: Secondary | ICD-10-CM

## 2013-05-19 MED ORDER — LEVALBUTEROL HCL 0.63 MG/3ML IN NEBU
0.6300 mg | INHALATION_SOLUTION | Freq: Once | RESPIRATORY_TRACT | Status: AC
  Administered 2013-05-19: 0.63 mg via RESPIRATORY_TRACT

## 2013-05-19 MED ORDER — AZITHROMYCIN 250 MG PO TABS: ORAL_TABLET | ORAL | Status: DC

## 2013-05-19 NOTE — Telephone Encounter (Signed)
Per CY: work-in with TP if available.  TP with opening at 3:15pm today.  Called spoke with patient; ov scheduled.  Will sign off.

## 2013-05-19 NOTE — Assessment & Plan Note (Signed)
Mild flare with AR -improving w/ steroid taper.  Xopenex neb x 1   Plan  Saline nasal rinses As needed   Mucinex DM Twice daily  As needed  Cough/congestion  Zpack to have on hold if symptoms worsen with discolored mucus.  Zyrtec 10mg  At bedtime  As needed  Drainage.  follow up Dr. Maple Hudson  As planned and As needed   Please contact office for sooner follow up if symptoms do not improve or worsen or seek emergency care

## 2013-05-19 NOTE — Telephone Encounter (Signed)
i spoke with pt. She c/o chest tx, lack of energy, cough w. Brown phlem. She is on her last does of prednisone. She is requesting OV. If she can't see CDY then she wants to see TP today. Please advise Dr. Maple Hudson thanks Last OV 12/23/12 Pending 06/23/13 Allergies  Allergen Reactions  . Sulfonamide Derivatives Other (See Comments)    Childhood allergy

## 2013-05-19 NOTE — Patient Instructions (Addendum)
Saline nasal rinses As needed   Mucinex DM Twice daily  As needed  Cough/congestion  Zpack to have on hold if symptoms worsen with discolored mucus.  Zyrtec 10mg  At bedtime  As needed  Drainage.  follow up Dr. Maple Hudson  As planned and As needed   Please contact office for sooner follow up if symptoms do not improve or worsen or seek emergency care

## 2013-05-19 NOTE — Progress Notes (Signed)
10/26/11- 65 yoF former smoker, followed for bronchitis, rhinitis, allergic conjunctivitis LOV-06/13/2010 She has had flu vaccine. Since last here has done very well from an allergy and respiratory standpoint. Patanol eyedrops and Flonase nasal spray help when needed.  03/28/12- 65 yoF former smoker, followed for bronchitis, rhinitis, allergic conjunctivitis  PCP Dr Felipa Eth Reports a good winter with no major acute problems. Some easy exertional dyspnea climbing stairs but admits little routine exercise. Stop Advair 2 years ago and is noticing a little wheezing. She asks about restarting Advair. Coughs up small mucous plugs at times. Frontal headache. Using Flonase and Pataday nasal spray.  12/23/12- 70 yoF former smoker, followed for bronchitis, rhinitis, allergic conjunctivitis  PCP Dr Felipa Eth FOLLOWS FOR: using Advair as needed. she doesn't have much energy and still gives out quickly. Has not been needing Advair regularly. She doesn't have a rescue inhaler and doesn't think she needs one. Little morning cough with scant mucus. Rhinitis controlled with Flonase, needs refill.  She is walking less because of arthritis and realizes she is deconditioned and gaining weight. She is pending hip replacement at the end of this month. Has had dizzy spells with sweating 2 weeks ago on first waking, and told her primary physician. Some tinnitus.  05/19/2013 Acute OV  Complains of prod cough with brown mucus, increased SOB, wheezing, chest tightness, bilat ear congestion, decreased SOB x5days.  Dr. Caryn Section in prednisone , w/ last dose this am.  Feels some better but not back baseline.  Still has cough , congestion and nasal drainage.  Coughing up brown mucus.  No hemoptysis, chest pain , orthopnea, fever, n/v/d.  [place]ent to [place]. Returned last pm.  No calf pain or edema.  No otc used.   ROS-see HPI Constitutional:   No-   weight loss, night sweats, fevers, chills, fatigue, lassitude. HEENT:    No-  headaches, difficulty swallowing, tooth/dental problems, sore throat,     No- sneezing, itching, ear ache,  +nasal congestion, post nasal drip,  CV:  No-   chest pain, orthopnea, PND, swelling in lower extremities, anasarca, dizziness, palpitations Resp: No-   shortness of breath with exertion or at rest.                No- coughing up of blood.              No-   change in color of mucus.  + Occasional wheezing.   Skin: No-   rash or lesions. GI:  No-   heartburn, indigestion, abdominal pain, nausea, vomiting,  GU: No-   dysuria,   Neuro-     nothing unusual Psych:  No- change in mood or affect. No depression or anxiety.  No memory loss.  OBJ General- Alert, Oriented, Affect-appropriate, Distress- none acute Skin- rash-none, lesions- none, excoriation- none Lymphadenopathy- none Head- atraumatic            Eyes- Gross vision intact, PERRLA, conjunctivae look pale, clear secretions            Ears- Hearing, canals-normal            Nose- clear, no-Septal dev, mucus, polyps, erosion, perforation             Throat- Mallampati II , mucosa clear , drainage- none, tonsils- atrophic Neck- flexible , trachea midline, no stridor , thyroid nl, carotid no bruit Chest - symmetrical excursion , unlabored           Heart/CV- RRR ,  no murmur , no gallop  , no rub, nl s1 s2                           - JVD- none , edema- none, stasis changes- none, varices- none           Lung- clear to P&A, wheeze- none, cough- none , dullness-none, rub- none           Chest wall-  Abd-  Br/ Gen/ Rectal- Not done, not indicated Extrem- cyanosis- none, clubbing, none, atrophy- none, strength- nl Neuro- grossly intact to observation. No nystagmus, and no carotid bruit.

## 2013-05-19 NOTE — Addendum Note (Signed)
Addended by: Boone Master E on: 05/19/2013 04:39 PM   Modules accepted: Orders

## 2013-06-17 ENCOUNTER — Telehealth: Payer: Self-pay | Admitting: Internal Medicine

## 2013-06-17 MED ORDER — FLUTICASONE-SALMETEROL 100-50 MCG/DOSE IN AEPB: 1.0000 | INHALATION_SPRAY | Freq: Two times a day (BID) | RESPIRATORY_TRACT | Status: DC | PRN

## 2013-06-17 NOTE — Telephone Encounter (Signed)
Pt aware RX sent 

## 2013-06-23 ENCOUNTER — Ambulatory Visit (INDEPENDENT_AMBULATORY_CARE_PROVIDER_SITE_OTHER): Payer: Medicare Other | Admitting: Internal Medicine

## 2013-06-23 ENCOUNTER — Encounter: Payer: Self-pay | Admitting: Internal Medicine

## 2013-06-23 VITALS — BP 122/74 | HR 72 | Ht 61.0 in | Wt 150.0 lb

## 2013-06-23 DIAGNOSIS — J45909 Unspecified asthma, uncomplicated: Secondary | ICD-10-CM

## 2013-06-23 DIAGNOSIS — J4520 Mild intermittent asthma, uncomplicated: Secondary | ICD-10-CM

## 2013-06-23 DIAGNOSIS — J00 Acute nasopharyngitis [common cold]: Secondary | ICD-10-CM

## 2013-06-23 MED ORDER — FLUTICASONE PROPIONATE 50 MCG/ACT NA SUSP: 2.0000 | Freq: Every day | NASAL | Status: DC

## 2013-06-23 NOTE — Patient Instructions (Addendum)
We can continue present treatment  Script sent for Flonase refill

## 2013-06-23 NOTE — Progress Notes (Signed)
10/26/11- 65 yoF former smoker, followed for bronchitis, rhinitis, allergic conjunctivitis LOV-06/13/2010 She has had flu vaccine. Since last here has done very well from an allergy and respiratory standpoint. Patanol eyedrops and Flonase nasal spray help when needed.  03/28/12- 65 yoF former smoker, followed for bronchitis, rhinitis, allergic conjunctivitis  PCP Dr Felipa Eth Reports a good winter with no major acute problems. Some easy exertional dyspnea climbing stairs but admits little routine exercise. Stop Advair 2 years ago and is noticing a little wheezing. She asks about restarting Advair. Coughs up small mucous plugs at times. Frontal headache. Using Flonase and Pataday nasal spray.  12/23/12- 72 yoF former smoker, followed for bronchitis, rhinitis, allergic conjunctivitis  PCP Dr Felipa Eth FOLLOWS FOR: using Advair as needed. she doesn't have much energy and still gives out quickly. Has not been needing Advair regularly. She doesn't have a rescue inhaler and doesn't think she needs one. Little morning cough with scant mucus. Rhinitis controlled with Flonase, needs refill.  She is walking less because of arthritis and realizes she is deconditioned and gaining weight. She is pending hip replacement at the end of this month. Has had dizzy spells with sweating 2 weeks ago on first waking, and told her primary physician. Some tinnitus.  05/19/2013 Acute OV  Complains of prod cough with brown mucus, increased SOB, wheezing, chest tightness, bilat ear congestion, decreased SOB x5days.  Dr. Caryn Section in prednisone , w/ last dose this am.  Feels some better but not back baseline.  Still has cough , congestion and nasal drainage.  Coughing up brown mucus.  No hemoptysis, chest pain , orthopnea, fever, n/v/d.  [place]ent to [place]. Returned last pm.  No calf pain or edema.  No otc used.   06/23/13- 66 yoF former smoker, followed for bronchitis, rhinitis, allergic conjunctivitis  PCP Dr  Felipa Eth FOLLOWS FOR: pt states breathing is doing well, continues on advair-- denies any new concerns at this time Has done well since nebulizer treatment last visit with nurse practitioner area that going in and out of cool air/air conditioner bothers her.  ROS-see HPI Constitutional:   No-   weight loss, night sweats, fevers, chills, fatigue, lassitude. HEENT:   No-  headaches, difficulty swallowing, tooth/dental problems, sore throat,     No- sneezing, itching, ear ache,  +nasal congestion, post nasal drip,  CV:  No-   chest pain, orthopnea, PND, swelling in lower extremities, anasarca, dizziness, palpitations Resp: No-   shortness of breath with exertion or at rest.                No- coughing up of blood.              No-   change in color of mucus.  + Occasional wheezing.   Skin: No-   rash or lesions. GI:  No-   heartburn, indigestion, abdominal pain, nausea, vomiting,  GU: No-   dysuria,   Neuro-     nothing unusual Psych:  No- change in mood or affect. No depression or anxiety.  No memory loss.  OBJ General- Alert, Oriented, Affect-appropriate, Distress- none acute Skin- rash-none, lesions- none, excoriation- none Lymphadenopathy- none Head- atraumatic            Eyes- Gross vision intact, PERRLA, conjunctivae look pale, clear secretions            Ears- Hearing, canals-normal            Nose- clear, no-Septal dev, mucus, polyps,  erosion, perforation             Throat- Mallampati II , mucosa clear , drainage- none, tonsils- atrophic Neck- flexible , trachea midline, no stridor , thyroid nl, carotid no bruit Chest - symmetrical excursion , unlabored           Heart/CV- RRR , no murmur , no gallop  , no rub, nl s1 s2                           - JVD- none , edema- none, stasis changes- none, varices- none           Lung- clear to P&A, wheeze- none, cough- none , dullness-none, rub- none           Chest wall-  Abd-  Br/ Gen/ Rectal- Not done, not indicated Extrem- cyanosis-  none, clubbing, none, atrophy- none, strength- nl Neuro- grossly intact to observation. No nystagmus, and no carotid bruit.

## 2013-07-06 NOTE — Assessment & Plan Note (Signed)
Good control. We discussed temperature as a trigger. Continue present meds.

## 2013-07-06 NOTE — Assessment & Plan Note (Signed)
Good control of allergic rhinitis and conjunctivitis currently. Mid-summer is not expected to be a problem season.

## 2013-10-23 ENCOUNTER — Ambulatory Visit: Payer: Medicare Other | Admitting: Cardiovascular Disease

## 2013-10-27 ENCOUNTER — Ambulatory Visit (INDEPENDENT_AMBULATORY_CARE_PROVIDER_SITE_OTHER): Payer: Medicare Other | Admitting: Cardiovascular Disease

## 2013-10-27 ENCOUNTER — Encounter: Payer: Self-pay | Admitting: Cardiovascular Disease

## 2013-10-27 VITALS — BP 150/80 | HR 66 | Ht 61.0 in | Wt 149.0 lb

## 2013-10-27 DIAGNOSIS — I10 Essential (primary) hypertension: Secondary | ICD-10-CM

## 2013-10-27 DIAGNOSIS — E785 Hyperlipidemia, unspecified: Secondary | ICD-10-CM

## 2013-10-27 NOTE — Progress Notes (Signed)
10/27/2013 Amber Roman   11-23-1946  [id number]  Primary Physician Hoyle Sauer, MD Primary Cardiologist: Runell Gess MD [name]   HPI:  The patient is a 67 year old, fit-appearing, divorced Caucasian female, mother of one 34-1/2-year-old [place] adopted daughter named Samara Deist who she adopted when she was 67 years old and is currently a Printmaker at [employer]. I last saw her a year ago. She has a history of hypertension, hyperlipidemia, and a strong family history for heart disease with a father who had his first MI at age 57 and died 20 years later of a massive myocardial infarction. She has never had a heart attack or stroke, otherwise is asymptomatic. Her other problems include uterine cancer having undergone a total abdominal hysterectomy without recurrence 12 years ago. Dr. Felipa Eth follows her lipid profile. She had an uncomplicated total hip replacement by Dr. Allie Bossier 01/09/2013. Since I saw her back she denies chest pain or shortness of breath.   Current Outpatient Prescriptions  Medication Sig Dispense Refill  . aspirin 81 MG tablet Take 81 mg by mouth daily.      . diphenoxylate-atropine (LOMOTIL) 2.5-0.025 MG per tablet Take 1 tablet by mouth 4 (four) times daily as needed. For stomach      . DULoxetine (CYMBALTA) 30 MG capsule Take 30 mg by mouth at bedtime. Take with 60 mg to make a 90 mg daily dose      . DULoxetine (CYMBALTA) 60 MG capsule Take 60 mg by mouth every morning. Take with 30 mg to make a 90 mg daily dose      . estradiol (VAGIFEM) 25 MCG vaginal tablet Place 25 mcg vaginally 2 (two) times a week.       . fluticasone (FLONASE) 50 MCG/ACT nasal spray Place 2 sprays into the nose daily. As needed  16 g  prn  . Fluticasone-Salmeterol (ADVAIR) 100-50 MCG/DOSE AEPB Inhale 1 puff into the lungs 2 (two) times daily as needed. For shortness of breath  60 each  3  . guaiFENesin (MUCINEX) 600 MG 12 hr tablet Take 1,200 mg by mouth 2 (two) times  daily as needed. For chest tightness      . irbesartan (AVAPRO) 300 MG tablet Take 1 tablet by mouth at bedtime.       [name] LORazepam (ATIVAN) 0.5 MG tablet Take 0.5 mg by mouth every 8 (eight) hours.      . Multiple Vitamin (MULTIVITAMIN) capsule Take 1 capsule by mouth daily.      . nebivolol (BYSTOLIC) 10 MG tablet Take 20 mg by mouth at bedtime.       [name] olopatadine (PATANOL) 0.1 % ophthalmic solution Place 1 drop into both eyes 2 (two) times daily as needed. For allergies  5 mL  3  . omeprazole (PRILOSEC OTC) 20 MG tablet Take 20 mg by mouth every morning.       [name] OVER THE COUNTER MEDICATION Vitamin B6 100mg  daily in the am      . rosuvastatin (CRESTOR) 20 MG tablet Take 20 mg by mouth at bedtime.       [name] zolpidem (AMBIEN) 10 MG tablet Take 10 mg by mouth at bedtime as needed. For sleep       No current facility-administered medications for this visit.    Allergies  Allergen Reactions  . Sulfonamide Derivatives Other (See Comments)    Childhood allergy     History   Social History  . Marital Status: Single  Spouse Name: N/A    Number of Children: N/A  . Years of Education: N/A   Occupational History  . Not on file.   Social History Main Topics  . Smoking status: Former Smoker -- 2.00 packs/day for 15 years    Types: Cigarettes    Quit date: 12/18/1968  . Smokeless tobacco: Never Used  . Alcohol Use: No  . Drug Use: No     Comment: hx of marijuana use   . Sexual Activity: Not on file   Other Topics Concern  . Not on file   Social History Narrative  . No narrative on file     Review of Systems: General: negative for chills, fever, night sweats or weight changes.  Cardiovascular: negative for chest pain, dyspnea on exertion, edema, orthopnea, palpitations, paroxysmal nocturnal dyspnea or shortness of breath Dermatological: negative for rash Respiratory: negative for cough or wheezing Urologic: negative for hematuria Abdominal: negative for nausea, vomiting,  diarrhea, bright red blood per rectum, melena, or hematemesis Neurologic: negative for visual changes, syncope, or dizziness All other systems reviewed and are otherwise negative except as noted above.    Blood pressure 150/80, pulse 66, height 5\' 1"  (1.549 m), weight 149 lb (67.586 kg).  General appearance: alert and no distress Neck: no adenopathy, no carotid bruit, no JVD, supple, symmetrical, trachea midline and thyroid not enlarged, symmetric, no tenderness/mass/nodules Lungs: clear to auscultation bilaterally Heart: regular rate and rhythm, S1, S2 normal, no murmur, click, rub or gallop Extremities: extremities normal, atraumatic, no cyanosis or edema  EKG normal sinus rhythm at 66 without ST or T wave changes  ASSESSMENT AND PLAN:   HYPERTENSION Hypertension well controlled on current medications  HYPERLIPIDEMIA Hyperlipidemia on statin therapy followed by her PCP      Runell Gess MD [facility], [facility] 10/27/2013 4:26 PM

## 2013-10-27 NOTE — Assessment & Plan Note (Signed)
Hypertension well controlled on current medications 

## 2013-10-27 NOTE — Patient Instructions (Signed)
Your physician wants you to follow-up in: 1 year with Dr Berry. You will receive a reminder letter in the mail two months in advance. If you don't receive a letter, please call our office to schedule the follow-up appointment.  

## 2013-10-27 NOTE — Assessment & Plan Note (Signed)
Hyperlipidemia on statin therapy followed by her PCP

## 2013-10-28 ENCOUNTER — Encounter: Payer: Self-pay | Admitting: Cardiovascular Disease

## 2014-01-16 ENCOUNTER — Other Ambulatory Visit: Payer: Self-pay | Admitting: Family Medicine

## 2014-01-16 ENCOUNTER — Ambulatory Visit
Admission: RE | Admit: 2014-01-16 | Discharge: 2014-01-16 | Disposition: A | Discharge status: Home / Self Care | Payer: Medicare Other | Source: Ambulatory Visit | Attending: Family Medicine | Admitting: Family Medicine

## 2014-01-16 DIAGNOSIS — R1011 Right upper quadrant pain: Secondary | ICD-10-CM

## 2014-04-08 ENCOUNTER — Encounter: Payer: Self-pay | Admitting: Neurology

## 2014-04-08 ENCOUNTER — Encounter: Payer: Self-pay | Admitting: *Deleted

## 2014-04-13 ENCOUNTER — Encounter (INDEPENDENT_AMBULATORY_CARE_PROVIDER_SITE_OTHER): Payer: Self-pay

## 2014-04-13 ENCOUNTER — Encounter: Payer: Self-pay | Admitting: Neurology

## 2014-04-13 ENCOUNTER — Ambulatory Visit (INDEPENDENT_AMBULATORY_CARE_PROVIDER_SITE_OTHER): Payer: Medicare Other | Admitting: Neurology

## 2014-04-13 VITALS — BP 136/77 | HR 67 | Resp 16 | Ht 63.0 in | Wt 158.0 lb

## 2014-04-13 DIAGNOSIS — R0609 Other forms of dyspnea: Secondary | ICD-10-CM

## 2014-04-13 DIAGNOSIS — G4730 Sleep apnea, unspecified: Principal | ICD-10-CM

## 2014-04-13 DIAGNOSIS — R0683 Snoring: Secondary | ICD-10-CM

## 2014-04-13 DIAGNOSIS — G4700 Insomnia, unspecified: Secondary | ICD-10-CM

## 2014-04-13 DIAGNOSIS — R0989 Other specified symptoms and signs involving the circulatory and respiratory systems: Secondary | ICD-10-CM

## 2014-04-13 DIAGNOSIS — G4710 Hypersomnia, unspecified: Secondary | ICD-10-CM | POA: Insufficient documentation

## 2014-04-13 DIAGNOSIS — G4763 Sleep related bruxism: Secondary | ICD-10-CM

## 2014-04-13 HISTORY — DX: Insomnia, unspecified: G47.00

## 2014-04-13 HISTORY — DX: Sleep related bruxism: G47.63

## 2014-04-13 MED ORDER — SUVOREXANT 15 MG PO TABS: 15.0000 mg | ORAL_TABLET | Freq: Every evening | ORAL | Status: DC

## 2014-04-13 NOTE — Progress Notes (Addendum)
[facility]  Provider:  Larey Seat, M D  Referring Provider: Tivis Ringer, MD Primary Care Physician:  Tivis Ringer, MD  Chief Complaint  Patient presents with  . New Evaluation    Room 10  . Sleep consult    HPI:  Amber Roman is a 68 y.o. caucasian, single   female , who is seen here as a referral  from Dr. Dagmar Hait for a sleep disorder.   [name] had reportedly a lot of energy for her middle age life, but over the last 8-10 years , and now feels that her quality of life has deminished by fatigue , by loss of energy. She is the mother of an adopted daughter, 70 years old , now a Ship broker at [employer]. She has been told that she snores now, stay and perhaps 15 years, she use to have proximal is in which no longer seems to be a problem for her. However her sleep quality is significantly diminished. Dr. Toy Care originally started her on Paxil , later on Viibryt and Cymbalta, she is currently changing to femizde.  [name] endorsed a bedtime around 11 PM, generalyl she will read for about 30 minutes before she falls asleep. No TV in the bedroom. Often she doesn't , and takes a 0.25 mg dose lorazepam- if she waited for one hour or longer to go to sleep. Melatonin ( UTT) and Benadryl are part of her" so-called" arsenal" and she will take it 2-3 times a week. She sleeps with her dog and feels that is a comfort to her. If she didn't take a medication, she will wake up earlier and has more fragmented sleep.  She will be up 3 hours and needs to nap, needs to nap in day time, 2-3 hours every day.  With medication, she sleeps through the night. She feels depressed, deprived of her energy, but not stressed.  One time bathroom break around 3  AM wakes up spontaneously at 7 .30 AM , but with medication she feels "groggy " until 11 AM. She has been dreaming vividly, she has a feeling of choking in sleep, and a dry mouth in the morning. No headaches.  The  bedroom the cool, quiet and dark. Her house feels comfortable and safe.   She has a family history of heart disease and colon cancer.  She has not use ETOH and marihuana over 30 years. Not a smoker over 40 years ago.   Review of Systems: Out of a complete 14 system review, the patient complains of only the following symptoms, and all other reviewed systems are negative. She enjoys the geriatric depression scale at the 5 points, the fatigue severity score at the fatigue severity score at 50 points, the Epworth sleepiness score at 5 points. Se likes naps, but feels preoccupied with naps, sleep and getting rest.    History   Social History  . Marital Status: Single    Spouse Name: N/A    Number of Children: 1  . Years of Education: College   Occupational History  . Not on file.   Social History Main Topics  . Smoking status: Former Smoker -- 2.00 packs/day for 15 years    Types: Cigarettes    Quit date: 12/18/1978  . Smokeless tobacco: Never Used  . Alcohol Use: No  . Drug Use: No     Comment: hx of marijuana use   . Sexual Activity: Not on file   Other Topics Concern  .  Not on file   Social History Narrative   Patient is single.   Patient has a college education.   Patient drinks one caffeine drink per day.   Patient is right-handed.   Patient has one child.          Family History  Problem Relation Age of Onset  . Heart attack Father   . High blood pressure Father   . Multiple sclerosis Father   . Lung disease Mother   . Colon cancer Brother   . Colon cancer Brother   . High blood pressure Brother   . Diabetes Mellitus II Sister   . High blood pressure Sister   . Obesity Sister     Past Medical History  Diagnosis Date  . Allergic conjunctivitis   . Esophageal reflux   . Bronchitis   . Uterine cancer     hysterectomy  . Hyperlipidemia   . Hypertension   . Depression   . Arthritis   . Family history of heart disease   . Bruxism, sleep-related  04/13/2014    Past Surgical History  Procedure Laterality Date  . Nasal septum surgery    . Total abdominal hysterectomy    . Bilateral salpingoophorectomy    . Carpal tunnel release      bilateral  . Total hip arthroplasty  01/10/2013    Procedure: TOTAL HIP ARTHROPLASTY ANTERIOR APPROACH;  Surgeon: Mcarthur Rossetti, MD;  Location: [facility];  Service: Orthopedics;  Laterality: Right;    Current Outpatient Prescriptions  Medication Sig Dispense Refill  . aspirin 81 MG tablet Take 81 mg by mouth daily.      . diphenoxylate-atropine (LOMOTIL) 2.5-0.025 MG per tablet Take 1 tablet by mouth 4 (four) times daily as needed. For stomach      . DULoxetine (CYMBALTA) 30 MG capsule Take 30 mg by mouth at bedtime. Take with 60 mg to make a 90 mg daily dose      . DULoxetine (CYMBALTA) 60 MG capsule Take 60 mg by mouth every morning. Take with 30 mg to make a 90 mg daily dose      . estradiol (VAGIFEM) 25 MCG vaginal tablet Place 25 mcg vaginally 2 (two) times a week.       . fluticasone (FLONASE) 50 MCG/ACT nasal spray Place 2 sprays into the nose daily. As needed  16 g  prn  . Fluticasone-Salmeterol (ADVAIR) 100-50 MCG/DOSE AEPB Inhale 1 puff into the lungs 2 (two) times daily as needed. For shortness of breath  60 each  3  . guaiFENesin (MUCINEX) 600 MG 12 hr tablet Take 1,200 mg by mouth 2 (two) times daily as needed. For chest tightness      . irbesartan (AVAPRO) 300 MG tablet Take 1 tablet by mouth at bedtime.       [name] LORazepam (ATIVAN) 0.5 MG tablet Take 0.5 mg by mouth every 8 (eight) hours.      . Multiple Vitamin (MULTIVITAMIN) capsule Take 1 capsule by mouth daily.      . nebivolol (BYSTOLIC) 10 MG tablet Take 20 mg by mouth at bedtime.       [name] olopatadine (PATANOL) 0.1 % ophthalmic solution Place 1 drop into both eyes 2 (two) times daily as needed. For allergies  5 mL  3  . omeprazole (PRILOSEC OTC) 20 MG tablet Take 20 mg by mouth every morning.       [name] omeprazole (PRILOSEC) 20 MG  capsule 1 capsule.      [name]  OVER THE COUNTER MEDICATION Vitamin B6 100mg  daily in the am      . rosuvastatin (CRESTOR) 20 MG tablet Take 20 mg by mouth at bedtime.        No current facility-administered medications for this visit.    Allergies as of 04/13/2014 - Review Complete 04/13/2014  Allergen Reaction Noted  . Sulfonamide derivatives Other (See Comments)     Vitals: BP 136/77  Pulse 67  Resp 16  Ht 5\' 3"  (1.6 m)  Wt 158 lb (71.668 kg)  BMI 28.00 kg/m2 Last Weight:  Wt Readings from Last 1 Encounters:  04/13/14 158 lb (71.668 kg)   Last Height:   Ht Readings from Last 1 Encounters:  04/13/14 5\' 3"  (1.6 m)    Physical exam:  General: The patient is awake, alert and appears not in acute distress. The patient is well groomed. Head: Normocephalic, atraumatic. Neck is supple. Mallampati 3 , neck circumference:17, no retrognathia,  Cardiovascular:  Regular rate and rhythm 62 bpm  , without  murmurs or carotid bruit, and without distended neck veins. Respiratory: Lungs are clear to auscultation. Skin:  Without evidence of edema, or rash Trunk: BMI is  elevated and patient  has normal posture.  Neurologic exam : The patient is awake and alert, oriented to place and time.  Memory subjective described as intact. There is a normal attention span & concentration ability.  Speech is fluent without  dysarthria, dysphonia or aphasia. Mood and affect are sceptic , a little jaded.   Cranial nerves: Pupils are equal and briskly reactive to light. Funduscopic exam without evidence of pallor or edema.  Extraocular movements  in vertical and horizontal planes intact and with an end point nystagmus, more to the left. Visual fields by finger perimetry are intact. Hearing to finger rub intact.  Facial sensation intact to fine touch.  Facial motor strength is symmetric and tongue and uvula move midline.  Motor exam:   Normal tone and normal muscle bulk and symmetric normal strength in all  extremities.  She had bilateral carpal tunnel surgery, right index finger is stiffer.  Sensory:  Fine touch, pinprick and vibration were tested in all extremities. Proprioception is tested in the upper extremities only. This was normal.  Coordination: Rapid alternating movements in the fingers/hands is tested and normal. Finger-to-nose maneuver without evidence of ataxia, dysmetria but with a mild tremor ( action )   Gait and station: Patient walks without assistive device , climbs up to the exam table.  Strength within normal limits. Stance is stable and normal. Tandem gait is expected for age , negative Romberg   Deep tendon reflexes: in the  upper and lower extremities are symmetric, (brisker in patella ) and intact. Babinski maneuver response is downgoing.   Assessment:  After physical and neurologic examination, review of laboratory studies, imaging, neurophysiology testing and pre-existing records, assessment is   Patient with fragmented sleep and reduced sleep quality, overweight. High grade Mallampati and Snoring.   Plan:  Treatment plan and additional workup : SPLIT study ordered, if patients AHI is 10 or less, consider a dental device.  Insomnia treatment with Belsomra, patient has an addiction history and should not use benzodiazepines or the related medications. Discussed sleep apnea, risk and risk factors and  Treatment options, such as dental devices and PAP.  Weight loss strategies advised.

## 2014-04-13 NOTE — Patient Instructions (Signed)

## 2014-05-05 ENCOUNTER — Ambulatory Visit (INDEPENDENT_AMBULATORY_CARE_PROVIDER_SITE_OTHER): Payer: Medicare Other

## 2014-05-05 ENCOUNTER — Other Ambulatory Visit: Payer: Self-pay | Admitting: Physician Assistant

## 2014-05-05 VITALS — BP 128/86 | HR 66 | Resp 14 | Ht 61.0 in | Wt 156.0 lb

## 2014-05-05 DIAGNOSIS — M79609 Pain in unspecified limb: Secondary | ICD-10-CM

## 2014-05-05 DIAGNOSIS — B351 Tinea unguium: Secondary | ICD-10-CM

## 2014-05-05 DIAGNOSIS — L600 Ingrowing nail: Secondary | ICD-10-CM

## 2014-05-05 MED ORDER — HYDROCODONE-ACETAMINOPHEN 5-325 MG PO TABS: 1.0000 | ORAL_TABLET | ORAL | Status: DC | PRN

## 2014-05-05 MED ORDER — EFINACONAZOLE 10 % EX SOLN: CUTANEOUS | Status: DC

## 2014-05-05 MED ORDER — CEPHALEXIN 500 MG PO CAPS: 500.0000 mg | ORAL_CAPSULE | Freq: Three times a day (TID) | ORAL | Status: DC

## 2014-05-05 NOTE — Patient Instructions (Signed)
Betadine Soak Instructions  Purchase an 8 oz. bottle of BETADINE solution (Povidone)  THE DAY AFTER THE PROCEDURE  Place 1 tablespoon of betadine solution in a quart of warm tap water.  Submerge your foot or feet with outer bandage intact for the initial soak; this will allow the bandage to become moist and wet for easy lift off.  Once you remove your bandage, continue to soak in the solution for 20 minutes.  This soak should be done twice a day.  Next, remove your foot or feet from solution, blot dry the affected area and cover.  You may use a band aid large enough to cover the area or use gauze and tape.  Apply other medications to the area as directed by the doctor such as cortisporin otic solution (ear drops) or neosporin.  IF YOUR SKIN BECOMES IRRITATED WHILE USING THESE INSTRUCTIONS, IT IS OKAY TO SWITCH TO EPSOM SALTS AND WATER OR WHITE VINEGAR AND WATER.  Begin using the topical nail antifungal on the non-operated toenails immediately apply one drop daily to each affected toe for 12 months. Wait at least 2-3 weeks for the right great toe the heel following today's procedure before applying the Jublia once daily for 12 months

## 2014-05-05 NOTE — Progress Notes (Signed)
   Subjective:    Patient ID: Amber Roman, female    DOB: 1946-01-08, 68 y.o.   MRN: [id number]  HPI Comments: N ingrown toenail L right 1st toenail D and O 1 year C tender, encurvated A pressure, enclosed shoes T pedicure  Pt complains of thick, discolor toenails, possible fungal infection.     Review of Systems  Constitutional: Positive for fatigue.  HENT: Positive for tinnitus.   Eyes: Positive for pain.  Respiratory: Positive for shortness of breath.   Cardiovascular: Positive for palpitations.  Skin: Positive for color change.  Allergic/Immunologic: Positive for environmental allergies.  All other systems reviewed and are negative.      Objective:   Physical Exam 68 year old white female cushions this time will help well-nourished white x3 vital signs stable does have complaint of painful ingrowing nails criptotic incurvated right hallux nail also has fungus the nails first and third bilateral foot addressed with topical antifungals.  Lower extremity objective findings neurovascular status is intact pedal pulses palpable DP postal for PT plus one over 4 skin temperature warm turgor normal no edema rubor pallor or varicosities noted neurologically epicritic and proprioceptive sensations intact and symmetric bilateral normal plantar response DTRs not elicited dermatologically skin color pigment and hair growth are normal there is keratosis incurvation of hallux and third digits bilateral with yellowing and discoloration and friability of nails consistent with onychomycosis the right first is most ingrown painful tender and symptomatic as well medial lateral border. No x-rays taken orthopedic exam otherwise unremarkable rectus foot type mild flexible digital contractures are noted       Assessment & Plan:  Assessment this time is ingrowing hallux nail clippers right great toe medial lateral borders also onychomycosis first and third bilateral which is painful and symptomatic  initiate topical antifungal therapy prescription for Jublia 4 into the field or pharmacy also at this time local anesthetic block Mr. to the right great toe medial lateral borders excised phalangectomy followed by alcohol wash this Neosporin and Band-Aid dressing applied to the right great toe patient will initiate daily dressing changes instructed in instructions for AP nail followup is given recheck in 2 weeks for followup and nail check after 2 as well as initiate topical Fungi-Nail treatment for that same right great toe followup in 2 weeks as instructed contact me change difficulties [name] for pain antibiotic medication cephalexin is dispensed prescription for hydrocodone or Norco is also given and followup in 2 weeks as instructed next  [employer] DPM

## 2014-05-18 ENCOUNTER — Ambulatory Visit (INDEPENDENT_AMBULATORY_CARE_PROVIDER_SITE_OTHER): Payer: Medicare Other | Admitting: Neurology

## 2014-05-18 DIAGNOSIS — G4733 Obstructive sleep apnea (adult) (pediatric): Secondary | ICD-10-CM

## 2014-05-20 ENCOUNTER — Ambulatory Visit (INDEPENDENT_AMBULATORY_CARE_PROVIDER_SITE_OTHER): Payer: Medicare Other

## 2014-05-20 VITALS — BP 156/72 | HR 75 | Resp 14 | Ht 61.5 in | Wt 156.0 lb

## 2014-05-20 DIAGNOSIS — L600 Ingrowing nail: Secondary | ICD-10-CM

## 2014-05-20 DIAGNOSIS — Z09 Encounter for follow-up examination after completed treatment for conditions other than malignant neoplasm: Secondary | ICD-10-CM

## 2014-05-20 DIAGNOSIS — B351 Tinea unguium: Secondary | ICD-10-CM

## 2014-05-20 NOTE — Patient Instructions (Signed)

## 2014-05-20 NOTE — Progress Notes (Signed)
   Subjective:    Patient ID: Edmonia James, female    DOB: 1946-08-18, 68 y.o.   MRN: [id number]  HPI Comments: Pt states the right 1st toenail is feeling better.  The right 1st toe has slight redness, and a dark, hard scar at each toenail border without drainage.     Review of Systems no new systemic changes or findings noted     Objective:   Physical Exam Neurovascular status is intact pedal pulses are palpable epicritic and proprioceptive sensations intact patient status post AP nail procedure medial lateral border right hallux is doing well there is no discharge or drainage slight eschar is noted it's tender until yesterday her today my feeling much better at this time.       Assessment & Plan:  Assessment good postop progress following AP nail procedure right great toe the nail border show eschar tissue slight erythema consistent with postop course no purulent discharge or drainage no increased temperature no fever no systemic signs of infection no local signs of infection patient ready for discharge at this time for followup on an as-needed basis  Harriet Masson DPM

## 2014-05-26 ENCOUNTER — Telehealth: Payer: Self-pay | Admitting: Neurology

## 2014-05-26 ENCOUNTER — Encounter: Payer: Self-pay | Admitting: *Deleted

## 2014-05-26 ENCOUNTER — Other Ambulatory Visit: Payer: Self-pay | Admitting: *Deleted

## 2014-05-26 DIAGNOSIS — G4733 Obstructive sleep apnea (adult) (pediatric): Secondary | ICD-10-CM

## 2014-05-26 NOTE — Telephone Encounter (Signed)
I called and left a message for the patient about her recent sleep study results. I informed the patient that the study revealed obstructive sleep apnea and that she did well on CPAP during the night of her study with significant improvement of her respiratory events. Dr. Brett Fairy recommend starting CPAP therapy at home, so I will send the order to [facility] who will contact her insurance carrier. I will fax a copy of the report to Dr. Danna Hefty office and mail a copy to the patient along with a follow up instruction letter.

## 2014-06-04 ENCOUNTER — Other Ambulatory Visit: Payer: Self-pay | Admitting: Otolaryngology

## 2014-06-04 DIAGNOSIS — H9319 Tinnitus, unspecified ear: Secondary | ICD-10-CM

## 2014-06-15 ENCOUNTER — Ambulatory Visit
Admission: RE | Admit: 2014-06-15 | Discharge: 2014-06-15 | Disposition: A | Discharge status: Home / Self Care | Payer: Medicare Other | Source: Ambulatory Visit | Attending: Otolaryngology | Admitting: Otolaryngology

## 2014-06-15 DIAGNOSIS — H9319 Tinnitus, unspecified ear: Secondary | ICD-10-CM

## 2014-06-15 MED ORDER — GADOBENATE DIMEGLUMINE 529 MG/ML IV SOLN
14.0000 mL | Freq: Once | INTRAVENOUS | Status: AC | PRN
  Administered 2014-06-15: 14 mL via INTRAVENOUS

## 2014-06-29 ENCOUNTER — Ambulatory Visit (INDEPENDENT_AMBULATORY_CARE_PROVIDER_SITE_OTHER)
Admission: RE | Admit: 2014-06-29 | Discharge: 2014-06-29 | Disposition: A | Discharge status: Home / Self Care | Payer: Medicare Other | Source: Ambulatory Visit | Attending: Internal Medicine | Admitting: Internal Medicine

## 2014-06-29 ENCOUNTER — Ambulatory Visit (INDEPENDENT_AMBULATORY_CARE_PROVIDER_SITE_OTHER): Payer: Medicare Other | Admitting: Internal Medicine

## 2014-06-29 ENCOUNTER — Encounter: Payer: Self-pay | Admitting: Internal Medicine

## 2014-06-29 VITALS — BP 130/78 | HR 93 | Ht 61.0 in | Wt 160.2 lb

## 2014-06-29 DIAGNOSIS — R059 Cough, unspecified: Secondary | ICD-10-CM

## 2014-06-29 DIAGNOSIS — J4520 Mild intermittent asthma, uncomplicated: Secondary | ICD-10-CM

## 2014-06-29 DIAGNOSIS — K219 Gastro-esophageal reflux disease without esophagitis: Secondary | ICD-10-CM

## 2014-06-29 DIAGNOSIS — R05 Cough: Secondary | ICD-10-CM

## 2014-06-29 DIAGNOSIS — G4733 Obstructive sleep apnea (adult) (pediatric): Secondary | ICD-10-CM

## 2014-06-29 DIAGNOSIS — J45909 Unspecified asthma, uncomplicated: Secondary | ICD-10-CM

## 2014-06-29 NOTE — Progress Notes (Signed)
10/26/11- 65 yoF former smoker, followed for bronchitis, rhinitis, allergic conjunctivitis LOV-06/13/2010 She has had flu vaccine. Since last here has done very well from an allergy and respiratory standpoint. Patanol eyedrops and Flonase nasal spray help when needed.  03/28/12- 65 yoF former smoker, followed for bronchitis, rhinitis, allergic conjunctivitis  PCP Dr Dagmar Hait Reports a good winter with no major acute problems. Some easy exertional dyspnea climbing stairs but admits little routine exercise. Stop Advair 2 years ago and is noticing a little wheezing. She asks about restarting Advair. Coughs up small mucous plugs at times. Frontal headache. Using Flonase and Pataday nasal spray.  12/23/12- 41 yoF former smoker, followed for bronchitis, rhinitis, allergic conjunctivitis  PCP Dr Dagmar Hait FOLLOWS FOR: using Advair as needed. she doesn't have much energy and still gives out quickly. Has not been needing Advair regularly. She doesn't have a rescue inhaler and doesn't think she needs one. Little morning cough with scant mucus. Rhinitis controlled with Flonase, needs refill.  She is walking less because of arthritis and realizes she is deconditioned and gaining weight. She is pending hip replacement at the end of this month. Has had dizzy spells with sweating 2 weeks ago on first waking, and told her primary physician. Some tinnitus.  05/19/2013 Acute OV  Complains of prod cough with brown mucus, increased SOB, wheezing, chest tightness, bilat ear congestion, decreased SOB x5days.  Dr. Solon Augusta in prednisone , w/ last dose this am.  Feels some better but not back baseline.  Still has cough , congestion and nasal drainage.  Coughing up brown mucus.  No hemoptysis, chest pain , orthopnea, fever, n/v/d.  [place]ent to [place]. Returned last pm.  No calf pain or edema.  No otc used.   06/23/13- 66 yoF former smoker, followed for bronchitis, rhinitis, allergic conjunctivitis  PCP Dr  Dagmar Hait FOLLOWS FOR: pt states breathing is doing well, continues on advair-- denies any new concerns at this time Has done well since nebulizer treatment last visit with nurse practitioner area that going in and out of cool air/air conditioner bothers her.  06/29/14- 51 yoF former smoker, followed for asthma with bronchitis, rhinitis, allergic conjunctivitis, complicated by OSA/ GNA, GERD,   PCP Dr Dagmar Hait FOLLOWS FOR:  Breathing doing well.  Reports having cough non-productive for past 2 months Dry cough. Using Advair, Flonase. No wheeze, phlegm, reflux, nasal drainage. Cough started before she had sleep study for OSA by Neurology. Had not told me of concerns. Pleased with CPAP 7/?DME. Seeing fr Thornell Mule for tinnitus.    ROS-see HPI Constitutional:   No-   weight loss, night sweats, fevers, chills, fatigue, lassitude. HEENT:   No-  headaches, difficulty swallowing, tooth/dental problems, sore throat,     No- sneezing, itching, ear ache,  +nasal congestion, post nasal drip,  CV:  No-   chest pain, orthopnea, PND, swelling in lower extremities, anasarca, dizziness, palpitations Resp: No-   shortness of breath with exertion or at rest.                No- coughing up of blood, productive cough-none, +nonproductive cough            No-   change in color of mucus.  + Occasional wheezing.   Skin: No-   rash or lesions. GI:  No-   heartburn, indigestion, abdominal pain, nausea, vomiting,  GU:   Neuro-     nothing unusual Psych:  No- change in mood or affect. No depression or  anxiety.  No memory loss.  OBJ General- Alert, Oriented, Affect-appropriate, Distress- none acute Skin- rash-none, lesions- none, excoriation- none Lymphadenopathy- none Head- atraumatic            Eyes- Gross vision intact, PERRLA, conjunctivae look pale, clear secretions            Ears- Hearing, canals-normal            Nose- clear, no-Septal dev, mucus, polyps, erosion, perforation             Throat- Mallampati III , mucosa  clear , drainage- none, tonsils- atrophic Neck- flexible , trachea midline, no stridor , thyroid nl, carotid no bruit Chest - symmetrical excursion , unlabored           Heart/CV- RRR , no murmur , no gallop  , no rub, nl s1 s2                           - JVD- none , edema- none, stasis changes- none, varices- none           Lung- clear to P&A, wheeze- none, cough- none , dullness-none, rub- none           Chest wall-  Abd-  Br/ Gen/ Rectal- Not done, not indicated Extrem- cyanosis- none, clubbing, none, atrophy- none, strength- nl Neuro- grossly intact to observation. No nystagmus, and no carotid bruit.

## 2014-06-29 NOTE — Patient Instructions (Signed)
Order- CXR- dx cough  Please call for refills as needed

## 2014-07-01 DIAGNOSIS — G4733 Obstructive sleep apnea (adult) (pediatric): Secondary | ICD-10-CM | POA: Insufficient documentation

## 2014-07-01 NOTE — Assessment & Plan Note (Signed)
Increased cough may be from bronchitis or her reflux Plan- continue inhalers, order CXR

## 2014-07-01 NOTE — Assessment & Plan Note (Signed)
CAP 7 managed by [facility]

## 2014-07-01 NOTE — Assessment & Plan Note (Signed)
Plan- Pay attention to GERD symptoms and more careful about reflux precautions

## 2014-07-03 ENCOUNTER — Ambulatory Visit (INDEPENDENT_AMBULATORY_CARE_PROVIDER_SITE_OTHER): Payer: Medicare Other | Admitting: Neurology

## 2014-07-03 ENCOUNTER — Encounter: Payer: Self-pay | Admitting: Neurology

## 2014-07-03 VITALS — BP 151/79 | HR 78 | Resp 16 | Ht 63.0 in | Wt 158.0 lb

## 2014-07-03 DIAGNOSIS — Z9989 Dependence on other enabling machines and devices: Principal | ICD-10-CM

## 2014-07-03 DIAGNOSIS — G4733 Obstructive sleep apnea (adult) (pediatric): Secondary | ICD-10-CM | POA: Insufficient documentation

## 2014-07-03 NOTE — Patient Instructions (Signed)
Sleep Apnea  Sleep apnea is a sleep disorder characterized by abnormal pauses in breathing while you sleep. When your breathing pauses, the level of oxygen in your blood decreases. This causes you to move out of deep sleep and into light sleep. As a result, your quality of sleep is poor, and the system that carries your blood throughout your body (cardiovascular system) experiences stress. If sleep apnea remains untreated, the following conditions can develop:  High blood pressure (hypertension).  Coronary artery disease.  Inability to achieve or maintain an erection (impotence).  Impairment of your thought process (cognitive dysfunction). There are three types of sleep apnea: 1. Obstructive sleep apnea--Pauses in breathing during sleep because of a blocked airway. 2. Central sleep apnea--Pauses in breathing during sleep because the area of the brain that controls your breathing does not send the correct signals to the muscles that control breathing. 3. Mixed sleep apnea--A combination of both obstructive and central sleep apnea. RISK FACTORS The following risk factors can increase your risk of developing sleep apnea:  Being overweight.  Smoking.  Having narrow passages in your nose and throat.  Being of older age.  Being female.  Alcohol use.  Sedative and tranquilizer use.  Ethnicity. Among individuals younger than 35 years, African Americans are at increased risk of sleep apnea. SYMPTOMS   Difficulty staying asleep.  Daytime sleepiness and fatigue.  Loss of energy.  Irritability.  Loud, heavy snoring.  Morning headaches.  Trouble concentrating.  Forgetfulness.  Decreased interest in sex. DIAGNOSIS  In order to diagnose sleep apnea, your caregiver will perform a physical examination. Your caregiver may suggest that you take a home sleep test. Your caregiver may also recommend that you spend the night in a sleep lab. In the sleep lab, several monitors record  information about your heart, lungs, and brain while you sleep. Your leg and arm movements and blood oxygen level are also recorded. TREATMENT The following actions may help to resolve mild sleep apnea:  Sleeping on your side.   Using a decongestant if you have nasal congestion.   Avoiding the use of depressants, including alcohol, sedatives, and narcotics.   Losing weight and modifying your diet if you are overweight. There also are devices and treatments to help open your airway:  Oral appliances. These are custom-made mouthpieces that shift your lower jaw forward and slightly open your bite. This opens your airway.  Devices that create positive airway pressure. This positive pressure "splints" your airway open to help you breathe better during sleep. The following devices create positive airway pressure:  Continuous positive airway pressure (CPAP) device. The CPAP device creates a continuous level of air pressure with an air pump. The air is delivered to your airway through a mask while you sleep. This continuous pressure keeps your airway open.  Nasal expiratory positive airway pressure (EPAP) device. The EPAP device creates positive air pressure as you exhale. The device consists of single-use valves, which are inserted into each nostril and held in place by adhesive. The valves create very little resistance when you inhale but create much more resistance when you exhale. That increased resistance creates the positive airway pressure. This positive pressure while you exhale keeps your airway open, making it easier to breath when you inhale again.  Bilevel positive airway pressure (BPAP) device. The BPAP device is used mainly in patients with central sleep apnea. This device is similar to the CPAP device because it also uses an air pump to deliver continuous air pressure   through a mask. However, with the BPAP machine, the pressure is set at two different levels. The pressure when you  exhale is lower than the pressure when you inhale.  Surgery. Typically, surgery is only done if you cannot comply with less invasive treatments or if the less invasive treatments do not improve your condition. Surgery involves removing excess tissue in your airway to create a wider passage way. Document Released: 11/24/2002 Document Revised: 03/31/2013 Document Reviewed: 04/11/2012 [employer] Patient Information 2015 [employer]. This information is not intended to replace advice given to you by your health care provider. Make sure you discuss any questions you have with your health care provider.  

## 2014-07-03 NOTE — Progress Notes (Signed)
[facility]   Provider:  Larey Seat, [place] D  Referring Provider: Tivis Ringer, MD Primary Care Physician:  Tivis Ringer, MD  Chief Complaint  Patient presents with  . Follow-up    Room 10  . Hypersomnia    HPI:  Amber Roman is a 68 y.o. caucasian, single   female , who is seen here as a referral  from Dr. Dagmar Hait for a sleep disorder.  Dear Dr Dagmar Hait,  Our mutual patient,  Amber Roman has given me today at glowing review of her developments since PSG study and implementation of CPAP therapy.  As you know she underwent a split-night polysomnography study on 05-18-14 , during which she was diagnosed with an AHI of 37.4 and an RDI of 42.1 . She slept in supine position only for the night. The lowest oxygen saturation was 83% for a total time of 20 minutes . After being titrated to a CPAP pressure of 7 cm water her AHI  became 0.0, the oxygen saturation nadir became 91%.  The patient presented with loud snoring and supine and the severe obstructive apnea:  she has now used her CPAP machine for 30 days and it is remarkable that he has such a high compliance. Today's download shows 97% compliance for CPAP,   the average time on CPAP is  7 hours and 50 minutes,  the patient has a residual AHI of 5.4 and almost no air leak. She reports that she is able to run errands  and  Is no longer preoccupied with the thought of having another now and when and where.  She said that she wakes up refreshed and that she is able to concentrate at work without fatiguing or dozing off. She walks her dog a couple of times a day now, she used not to - felt too fatigued.  She is happy and energized . She wakes up earlier now 7.30 AM  , her bed time is still 11 PM. No problems with mask fit and machine use.     Geriatric depression score was endorsed at 1 point, her  fatigue severity at 27 points and sleep scale ( Epworth )  at 5 points, She endorsed last  visit  the geriatric depression scale at the 5 points, the fatigue severity score  at 50 points, the Epworth sleepiness score at 5 points.     Last visit note, CD  Mrs. Putnam had reportedly a lot of energy for her middle age life, but over the last 8-10 years , and now feels that her quality of life has deminished by fatigue , by loss of energy. She is the mother of an adopted daughter, 59 years old , now a Ship broker at [employer]. She has been told that she snores now, stay and perhaps 15 years, she use to have proximal is in which no longer seems to be a problem for her. However her sleep quality is significantly diminished. Dr. Toy Care originally started her on Paxil , later on Viibryt and Cymbalta, she is currently changing to femizde.  Mrs. Phillis endorsed a bedtime around 11 PM, generalyl she will read for about 30 minutes before she falls asleep. No TV in the bedroom. Often she doesn't , and takes a 0.25 mg dose lorazepam- if she waited for one hour or longer to go to sleep. Melatonin ( UTT) and Benadryl are part of her" so-called" arsenal" and she will take it 2-3 times  a week. She sleeps with her dog and feels that is a comfort to her. If she didn't take a medication, she will wake up earlier and has more fragmented sleep.  She will be up 3 hours and needs to nap, needs to nap in day time, 2-3 hours every day.  With medication, she sleeps through the night. She feels depressed, deprived of her energy, but not stressed.  One time bathroom break around 3  AM wakes up spontaneously at 7 .30 AM , but with medication she feels "groggy " until 11 AM. She has been dreaming vividly, she has a feeling of choking in sleep, and a dry mouth in the morning. No headaches.  The bedroom the cool, quiet and dark. Her house feels comfortable and safe.   She has a family history of heart disease and colon cancer.  She has not use ETOH and marihuana over 30 years. Not a smoker over 40 years ago.   Review of  Systems: Out of a complete 14 system review, the patient complains of only the following symptoms, and all other reviewed systems are negative.Geriatric depression score was endorsed at 1 point, her  fatigue severity at 27 points and sleep scale ( Epworth )  at 5 points, She endorsed last visit  the geriatric depression scale at the 5 points, the fatigue severity score  at 50 points, the Epworth sleepiness score at 5 points.     History   Social History  . Marital Status: Single    Spouse Name: N/A    Number of Children: 1  . Years of Education: College   Occupational History  . Not on file.   Social History Main Topics  . Smoking status: Former Smoker -- 2.00 packs/day for 15 years    Types: Cigarettes    Quit date: 12/18/1978  . Smokeless tobacco: Never Used  . Alcohol Use: No  . Drug Use: No     Comment: hx of marijuana use   . Sexual Activity: Not on file   Other Topics Concern  . Not on file   Social History Narrative   Patient is single.   Patient has a college education.   Patient drinks one caffeine drink per day.   Patient is right-handed.   Patient has one child.          Family History  Problem Relation Age of Onset  . Heart attack Father   . High blood pressure Father   . Multiple sclerosis Father   . Lung disease Mother   . Colon cancer Brother   . Colon cancer Brother   . High blood pressure Brother   . Diabetes Mellitus II Sister   . High blood pressure Sister   . Obesity Sister     Past Medical History  Diagnosis Date  . Allergic conjunctivitis   . Esophageal reflux   . Bronchitis   . Uterine cancer     hysterectomy  . Hyperlipidemia   . Hypertension   . Depression   . Arthritis   . Family history of heart disease   . Bruxism, sleep-related 04/13/2014  . Insomnia 04/13/2014    Past Surgical History  Procedure Laterality Date  . Nasal septum surgery    . Total abdominal hysterectomy    . Bilateral salpingoophorectomy    . Carpal  tunnel release      bilateral  . Total hip arthroplasty  01/10/2013    Procedure: TOTAL HIP ARTHROPLASTY ANTERIOR APPROACH;  Surgeon:  Mcarthur Rossetti, MD;  Location: [facility];  Service: Orthopedics;  Laterality: Right;    Current Outpatient Prescriptions  Medication Sig Dispense Refill  . aspirin 81 MG tablet Take 81 mg by mouth daily.      . diphenoxylate-atropine (LOMOTIL) 2.5-0.025 MG per tablet Take 1 tablet by mouth 4 (four) times daily as needed. For stomach      . estradiol (VAGIFEM) 25 MCG vaginal tablet Place 25 mcg vaginally 2 (two) times a week.       . fluticasone (FLONASE) 50 MCG/ACT nasal spray Place 2 sprays into the nose daily. As needed  16 g  prn  . Fluticasone-Salmeterol (ADVAIR) 100-50 MCG/DOSE AEPB Inhale 1 puff into the lungs 2 (two) times daily as needed. For shortness of breath  60 each  3  . guaiFENesin (MUCINEX) 600 MG 12 hr tablet Take 1,200 mg by mouth 2 (two) times daily as needed. For chest tightness      . irbesartan (AVAPRO) 300 MG tablet Take 1 tablet by mouth at bedtime.       . Levomilnacipran HCl ER (FETZIMA) 40 MG CP24 Take by mouth at bedtime.      [name] LORazepam (ATIVAN) 0.5 MG tablet Take 0.5 mg by mouth every 8 (eight) hours.      . Multiple Vitamin (MULTIVITAMIN) capsule Take 1 capsule by mouth daily.      . nebivolol (BYSTOLIC) 10 MG tablet Take 20 mg by mouth at bedtime.       [name] olopatadine (PATANOL) 0.1 % ophthalmic solution Place 1 drop into both eyes 2 (two) times daily as needed. For allergies  5 mL  3  . omeprazole (PRILOSEC OTC) 20 MG tablet Take 20 mg by mouth every morning.       [name] OVER THE COUNTER MEDICATION Vitamin B6 100mg  daily in the am      . rosuvastatin (CRESTOR) 20 MG tablet Take 20 mg by mouth at bedtime.        No current facility-administered medications for this visit.    Allergies as of 07/03/2014 - Review Complete 07/03/2014  Allergen Reaction Noted  . Sulfonamide derivatives Other (See Comments)     Vitals: BP  151/79  Pulse 78  Resp 16  Ht 5\' 3"  (1.6 m)  Wt 158 lb (71.668 kg)  BMI 28.00 kg/m2 Last Weight:  Wt Readings from Last 1 Encounters:  07/03/14 158 lb (71.668 kg)   Last Height:   Ht Readings from Last 1 Encounters:  07/03/14 5\' 3"  (1.6 m)    Physical exam:  General: The patient is awake, alert and appears not in acute distress. The patient is well groomed. Head: Normocephalic, atraumatic. Neck is supple. Mallampati 3 , neck circumference:17, no retrognathia,  Cardiovascular:  Regular rate and rhythm 62 bpm  , without  murmurs or carotid bruit, and without distended neck veins. Respiratory: Lungs are clear to auscultation. Skin:  Without evidence of edema, or rash Trunk: BMI is  elevated and patient  has normal posture.  Neurologic exam : The patient is awake and alert, oriented to place and time.  Memory subjective described as intact. There is a normal attention span & concentration ability.  Speech is fluent without  dysarthria, dysphonia or aphasia. Mood and affect are sceptic , a little jaded.   Cranial nerves: Pupils are equal and briskly reactive to light. Funduscopic exam without evidence of pallor or edema.  Extraocular movements  in vertical and horizontal planes intact and with an end  point nystagmus, more to the left. Visual fields by finger perimetry are intact. Hearing to finger rub intact.  Facial sensation intact to fine touch.  Facial motor strength is symmetric and tongue and uvula move midline.  Motor exam:   Normal tone and normal muscle bulk and symmetric normal strength in all extremities.  She had bilateral carpal tunnel surgery, right index finger is stiffer.  Sensory:  Fine touch, pinprick and vibration were tested in all extremities. Proprioception is tested in the upper extremities only. This was normal.  Coordination: Rapid alternating movements in the fingers/hands is tested and normal. Finger-to-nose maneuver without evidence of ataxia, dysmetria  but with a mild tremor ( action )   Gait and station: Patient walks without assistive device , climbs up to the exam table.  Strength within normal limits. Stance is stable and normal. Tandem gait is expected for age , negative Romberg   Deep tendon reflexes: in the  upper and lower extremities are symmetric, (brisker in patella ) and intact. Babinski maneuver response is downgoing.   Assessment:  After physical and neurologic examination, review of laboratory studies, imaging, neurophysiology testing and pre-existing records, assessment is   Patient with severe OSA, now treated on CPAP low pressure setting.  Happy, energized. She feels less depressed, but is on a new medication. She feels a little "snappy". Recovering alcoholic.    Plan:  Treatment plan and additional workup : continue CPAP , will follow up in 6 month, than yearly. Weight loss strategies advised.

## 2014-07-08 ENCOUNTER — Institutional Professional Consult (permissible substitution): Payer: Medicare Other | Admitting: Neurology

## 2014-07-13 ENCOUNTER — Encounter: Payer: Self-pay | Admitting: Neurology

## 2014-08-19 ENCOUNTER — Encounter: Payer: Self-pay | Admitting: Neurology

## 2014-08-19 ENCOUNTER — Ambulatory Visit (INDEPENDENT_AMBULATORY_CARE_PROVIDER_SITE_OTHER): Payer: Medicare Other | Admitting: Neurology

## 2014-08-19 VITALS — BP 147/72 | HR 67 | Resp 16 | Ht 62.0 in | Wt 157.0 lb

## 2014-08-19 DIAGNOSIS — H9311 Tinnitus, right ear: Secondary | ICD-10-CM

## 2014-08-19 DIAGNOSIS — Z9989 Dependence on other enabling machines and devices: Principal | ICD-10-CM

## 2014-08-19 DIAGNOSIS — H9319 Tinnitus, unspecified ear: Secondary | ICD-10-CM

## 2014-08-19 DIAGNOSIS — G4733 Obstructive sleep apnea (adult) (pediatric): Secondary | ICD-10-CM

## 2014-08-19 DIAGNOSIS — R279 Unspecified lack of coordination: Secondary | ICD-10-CM

## 2014-08-19 DIAGNOSIS — R270 Ataxia, unspecified: Secondary | ICD-10-CM | POA: Insufficient documentation

## 2014-08-19 NOTE — Patient Instructions (Signed)
Tinnitus °Sounds you hear in your ears and coming from within the ear is called tinnitus. This can be a symptom of many ear disorders. It is often associated with hearing loss.  °Tinnitus can be seen with: °· Infections. °· Ear blockages such as wax buildup. °· Meniere's disease. °· Ear damage. °· Inherited. °· Occupational causes. °While irritating, it is not usually a threat to health. When the cause of the tinnitus is wax, infection in the middle ear, or foreign body it is easily treated. Hearing loss will usually be reversible.  °TREATMENT  °When treating the underlying cause does not get rid of tinnitus, it may be necessary to get rid of the unwanted sound by covering it up with more pleasant background noises. This may include music, the radio etc. There are tinnitus maskers which can be worn which produce background noise to cover up the tinnitus. °Avoid all medications which tend to make tinnitus worse such as alcohol, caffeine, aspirin, and nicotine. There are many soothing background tapes such as rain, ocean, thunderstorms, etc. These soothing sounds help with sleeping or resting. °Keep all follow-up appointments and referrals. This is important to identify the cause of the problem. It also helps avoid complications, impaired hearing, disability, or chronic pain. °Document Released: 12/04/2005 Document Revised: 02/26/2012 Document Reviewed: 07/22/2008 °[employer]® Patient Information ©2015 [employer]. This information is not intended to replace advice given to you by your health care provider. Make sure you discuss any questions you have with your health care provider. ° °

## 2014-08-19 NOTE — Progress Notes (Signed)
[facility]   Provider:  Larey Seat, [place] D  Referring Provider: Tivis Ringer, MD Primary Care Physician:  Tivis Ringer, MD  Chief Complaint  Patient presents with  . Follow-up    Room 11  . Tinnitus    Old problem    HPI:  Amber Roman is a 68 y.o. caucasian, single, right handed  female , who is seen here as a revisit -referral from ENT Dr Thornell Mule .      Dear Dr Dagmar Hait,  And has done exceptionally well with the use of her CPAP and has felt much better since using CPAP at a lot of the symptoms we initially discussed in our consult visit have resolved. She has a residual AHI of 5.4 which is slightly above the desirable limit of 5.0. I have referred her back to a [facility] advanced home care so an air leak age  could be addressed and reduce the AHI .  Overall she's doing well. In office Elenore Rota today revealed an AHI of 4.7 a user time of 7 hours and 32 minutes at home on average as a pressure of 7 cm with an EPR level of 2 and over the last 76 days and compliance of 93%.     It may last visit with her she had complained about a feeling of grafting to one side as well as an acute onset of the above of some tinnitus. She was evaluated in detail by ENT physician Dr. Thornell Mule. He performed an electronystagmogram which was within normal limits velocity, latency and accuracy of saccades, optokinetic responses Dix-Hallpike maneuver caloric irrigation, hearing and external ear examination were normal and a brain MRI has been normal.no diplopia, no vertigo .  I can certainly rule out a stroke as causing this patient's problems and it seems that from the ear nose and throat perspective, there is no explanation for the tinnitus either.    Last visit :  Our mutual patient,  Amber Roman has given me today at glowing review of her developments since PSG study and implementation of CPAP therapy.  As you know she underwent a split-night  polysomnography study on 05-18-14 , during which she was diagnosed with an AHI of 37.4 and an RDI of 42.1 . She slept in supine position only for the night. The lowest oxygen saturation was 83% for a total time of 20 minutes . After being titrated to a CPAP pressure of 7 cm water her AHI  became 0.0, the oxygen saturation nadir became 91%.  The patient presented with loud snoring and supine and the severe obstructive apnea:  she has now used her CPAP machine for 30 days and it is remarkable that he has such a high compliance. Today's download shows 97% compliance for CPAP,   the average time on CPAP is  7 hours and 50 minutes,  the patient has a residual AHI of 5.4 and almost no air leak. She reports that she is able to run errands  and  Is no longer preoccupied with the thought of having another now and when and where.  She said that she wakes up refreshed and that she is able to concentrate at work without fatiguing or dozing off. She walks her dog a couple of times a day now, she used not to - felt too fatigued.  She is happy and energized . She wakes up earlier now 7.30 AM  , her bed time is still  11 PM. No problems with mask fit and machine use.   Geriatric depression score was endorsed at 1 point, her  fatigue severity at 27 points and sleep scale ( Epworth )  at 5 points, She endorsed last visit  the geriatric depression scale at the 5 points, the fatigue severity score  at 50 points, the Epworth sleepiness score at 5 points.     Last visit note, CD  Amber Roman had reportedly a lot of energy for her middle age life, but over the last 8-10 years , and now feels that her quality of life has deminished by fatigue , by loss of energy. She is the mother of an adopted daughter, 63 years old , now a Ship broker at [employer]. She has been told that she snores now, stay and perhaps 15 years, she use to have proximal is in which no longer seems to be a problem for her. However her sleep quality is  significantly diminished. Dr. Toy Care originally started her on Paxil , later on Viibryt and Cymbalta, she is currently changing to femizde.  Mrs. Aerts endorsed a bedtime around 11 PM, generalyl she will read for about 30 minutes before she falls asleep. No TV in the bedroom. Often she doesn't , and takes a 0.25 mg dose lorazepam- if she waited for one hour or longer to go to sleep. Melatonin ( UTT) and Benadryl are part of her" so-called" arsenal" and she will take it 2-3 times a week. She sleeps with her dog and feels that is a comfort to her. If she didn't take a medication, she will wake up earlier and has more fragmented sleep.  She will be up 3 hours and needs to nap, needs to nap in day time, 2-3 hours every day.  With medication, she sleeps through the night. She feels depressed, deprived of her energy, but not stressed.  One time bathroom break around 3  AM wakes up spontaneously at 7 .30 AM , but with medication she feels "groggy " until 11 AM. She has been dreaming vividly, she has a feeling of choking in sleep, and a dry mouth in the morning. No headaches.  The bedroom the cool, quiet and dark. Her house feels comfortable and safe.   She has a family history of heart disease and colon cancer.  She has not use ETOH and marihuana over 30 years. Not a smoker over 40 years ago.   Review of Systems:  08-18-14: Out of a complete 14 system review, the patient complains of only the following symptoms, and all other reviewed systems are negative.Geriatric depression score was endorsed at 1 point, her  fatigue severity at 20 points and sleep scale ( Epworth )  at 3 points,     History   Social History  . Marital Status: Single    Spouse Name: N/A    Number of Children: 1  . Years of Education: College   Occupational History  . Not on file.   Social History Main Topics  . Smoking status: Former Smoker -- 2.00 packs/day for 15 years    Types: Cigarettes    Quit date: 12/18/1978  .  Smokeless tobacco: Never Used  . Alcohol Use: No  . Drug Use: No     Comment: hx of marijuana use   . Sexual Activity: Not on file   Other Topics Concern  . Not on file   Social History Narrative   Patient is single.   Patient has a  college education.   Patient drinks one caffeine drink per day.   Patient is right-handed.   Patient has one child.          Family History  Problem Relation Age of Onset  . Heart attack Father   . High blood pressure Father   . Multiple sclerosis Father   . Lung disease Mother   . Colon cancer Brother   . Colon cancer Brother   . High blood pressure Brother   . Diabetes Mellitus II Sister   . High blood pressure Sister   . Obesity Sister     Past Medical History  Diagnosis Date  . Allergic conjunctivitis   . Esophageal reflux   . Bronchitis   . Uterine cancer     hysterectomy  . Hyperlipidemia   . Hypertension   . Depression   . Arthritis   . Family history of heart disease   . Bruxism, sleep-related 04/13/2014  . Insomnia 04/13/2014    Past Surgical History  Procedure Laterality Date  . Nasal septum surgery    . Total abdominal hysterectomy    . Bilateral salpingoophorectomy    . Carpal tunnel release      bilateral  . Total hip arthroplasty  01/10/2013    Procedure: TOTAL HIP ARTHROPLASTY ANTERIOR APPROACH;  Surgeon: Mcarthur Rossetti, MD;  Location: [facility];  Service: Orthopedics;  Laterality: Right;    Current Outpatient Prescriptions  Medication Sig Dispense Refill  . aspirin 81 MG tablet Take 81 mg by mouth daily.      . diphenoxylate-atropine (LOMOTIL) 2.5-0.025 MG per tablet Take 1 tablet by mouth 4 (four) times daily as needed. For stomach      . estradiol (VAGIFEM) 25 MCG vaginal tablet Place 25 mcg vaginally 2 (two) times a week.       . fluticasone (FLONASE) 50 MCG/ACT nasal spray Place 2 sprays into the nose daily. As needed  16 g  prn  . Fluticasone-Salmeterol (ADVAIR) 100-50 MCG/DOSE AEPB Inhale 1 puff  into the lungs 2 (two) times daily as needed. For shortness of breath  60 each  3  . guaiFENesin (MUCINEX) 600 MG 12 hr tablet Take 1,200 mg by mouth 2 (two) times daily as needed. For chest tightness      . irbesartan (AVAPRO) 300 MG tablet Take 1 tablet by mouth at bedtime.       . Levomilnacipran HCl ER (FETZIMA) 40 MG CP24 Take by mouth at bedtime.      . Multiple Vitamin (MULTIVITAMIN) capsule Take 1 capsule by mouth daily.      . nebivolol (BYSTOLIC) 10 MG tablet Take 20 mg by mouth at bedtime.       [name] olopatadine (PATANOL) 0.1 % ophthalmic solution Place 1 drop into both eyes 2 (two) times daily as needed. For allergies  5 mL  3  . OVER THE COUNTER MEDICATION Vitamin B6 100mg  daily in the am      . rosuvastatin (CRESTOR) 20 MG tablet Take 20 mg by mouth at bedtime.        No current facility-administered medications for this visit.    Allergies as of 08/19/2014 - Review Complete 08/19/2014  Allergen Reaction Noted  . Sulfonamide derivatives Other (See Comments)     Vitals: BP 147/72  Pulse 67  Resp 16  Ht 5\' 2"  (1.575 m)  Wt 157 lb (71.215 kg)  BMI 28.71 kg/m2 Last Weight:  Wt Readings from Last 1 Encounters:  08/19/14 157 lb (  71.215 kg)   Last Height:   Ht Readings from Last 1 Encounters:  08/19/14 5\' 2"  (1.575 m)    Physical exam:  General: The patient is awake, alert and appears not in acute distress. The patient is well groomed. Head: Normocephalic, atraumatic. Neck is supple. Mallampati 3 , neck circumference:17, no retrognathia,  Cardiovascular:  Regular rate and rhythm 62 bpm  , without  murmurs or carotid bruit, and without distended neck veins. Respiratory: Lungs are clear to auscultation. Skin:  Without evidence of edema, or rash Trunk: BMI is elevated and patient  has normal posture.  Neurologic exam : The patient is awake and alert, oriented to place and time.  Memory subjective described as intact. There is a normal attention span & concentration  ability.  Speech is fluent without dysarthria, dysphonia or aphasia. Mood and affect are normal, friendly and cooperative.  Cranial nerves: Pupils are equal and briskly reactive to light. Funduscopic exam without evidence of pallor or edema.  Extraocular movements  in vertical and horizontal planes intact and with an end point nystagmus, more to the left.  Visual fields by finger perimetry are intact. Hearing to finger rub intact.  Facial sensation intact to fine touch. Facial motor strength is symmetric and tongue and uvula move midline.  Motor exam:   Normal tone and normal muscle bulk and symmetric normal strength in all extremities.  She had bilateral carpal tunnel surgery, right index finger is stiffer.  Sensory:  Fine touch, pinprick and vibration were tested in all extremities.  The patient reports needing optic control to walk steady. She has limited proprioiception from her feet .  Proprioception is tested in the upper extremities as  normal.  Coordination: Rapid alternating movements in the fingers/hands is tested and normal. Finger-to-nose maneuver without evidence of ataxia, dysmetria but with a mild tremor ( action )   Gait and station: Patient walks without assistive device , climbs up to the exam table.  Strength within normal limits. Stance is stable , wider based .   Tandem gait is ataxic for age , negative Romberg , heel waling is impaired but she can stand on her heels, toe walking is intact.   Deep tendon reflexes: in the  upper and lower extremities are symmetric, (brisker in patella ) and intact. Babinski maneuver response is downgoing.   Assessment:  After physical and neurologic examination, review of laboratory studies, imaging, neurophysiology testing and pre-existing records, assessment is   Patient with severe OSA, now treated on CPAP low pressure setting.  Happy, energized. She feels less depressed, but is on a new medication. She feels a little "snappy".  Recovering alcoholic.    Plan:  Treatment plan and additional workup :   1)Tinnitus I have no further diagnostic tests or treatment options for this patient with objective tinnitus, and almost super-normal hearing, Normal brain  MRI ,normal ear exam, normal audiology, nystagmogram and calorics.   2)OSA- continue CPAP, AHI of 4.7 is slightly elevated will follow up in 12 month.  3)Neuropathic ataxia, some proprioceptive loss, non- cerebellar ataxia. Multivitamins daily, Rv in 12 month , no falls .    Visit length for all 3 issues , discussion of material and Outside test results was 40 minutes.

## 2014-09-07 ENCOUNTER — Encounter: Payer: Self-pay | Admitting: Neurology

## 2014-10-30 ENCOUNTER — Ambulatory Visit (INDEPENDENT_AMBULATORY_CARE_PROVIDER_SITE_OTHER): Payer: Medicare Other | Admitting: Cardiovascular Disease

## 2014-10-30 ENCOUNTER — Encounter: Payer: Self-pay | Admitting: Cardiovascular Disease

## 2014-10-30 VITALS — BP 110/78 | HR 66 | Ht 61.5 in | Wt 154.1 lb

## 2014-10-30 DIAGNOSIS — I10 Essential (primary) hypertension: Secondary | ICD-10-CM

## 2014-10-30 DIAGNOSIS — G4733 Obstructive sleep apnea (adult) (pediatric): Secondary | ICD-10-CM

## 2014-10-30 DIAGNOSIS — Z9989 Dependence on other enabling machines and devices: Secondary | ICD-10-CM

## 2014-10-30 NOTE — Assessment & Plan Note (Signed)
Newly diagnosed obstructive sleep apnea by Dr. Fernande Bras with remarkable response to C Pap.

## 2014-10-30 NOTE — Patient Instructions (Signed)
Your physician wants you to follow-up in 1 year with Dr. Berry. You will receive a reminder letter in the mail 2 months in advance. If you do not receive a letter, please call our office to schedule the follow-up appointment.  

## 2014-10-30 NOTE — Assessment & Plan Note (Signed)
History of hyperlipidemia on rosuvastatin followed by her primary care physician.

## 2014-10-30 NOTE — Assessment & Plan Note (Signed)
History of hypertension with blood pressure today measuring 110/78 controlled on Avapro and by diastolic. We will continue her current medications.

## 2014-10-30 NOTE — Progress Notes (Signed)
10/30/2014 Amber Roman   February 23, 1946  [id number]  Primary Physician Tivis Ringer, MD Primary Cardiologist: Lorretta Harp MD [name]   HPI:  The patient is a 68 year old, fit-appearing, divorced Caucasian female, mother of one 50-1/2-year-old [place] adopted daughter named [name] who she adopted when she was 68 years old and is currently a Paramedic at [employer]. I last saw her a year ago. She has a history of hypertension, hyperlipidemia, and a strong family history for heart disease with a father who had his first MI at age 60 and died 17 years later of a massive myocardial infarction. Her last Myoview stress test performed 10/25/12 was not ischemic.She has never had a heart attack or stroke, otherwise is asymptomatic. Her other problems include uterine cancer having undergone a total abdominal hysterectomy without recurrence 12 years ago. Dr. Dagmar Hait follows her lipid profile. She had an uncomplicated total hip replacement by Dr. Zollie Beckers 01/09/2013. Since I saw her back she denies chest pain or shortness of breath. She was newly diagnosed obstructive sleep apnea by Dr. Fernande Bras and has been a remarkable responder to CPAP   Current Outpatient Prescriptions  Medication Sig Dispense Refill  . aspirin 81 MG tablet Take 81 mg by mouth daily.    . diphenoxylate-atropine (LOMOTIL) 2.5-0.025 MG per tablet Take 1 tablet by mouth 4 (four) times daily as needed. For stomach    . estradiol (VAGIFEM) 25 MCG vaginal tablet Place 25 mcg vaginally 2 (two) times a week.     . fluticasone (FLONASE) 50 MCG/ACT nasal spray Place 2 sprays into the nose daily. As needed 16 g prn  . Fluticasone-Salmeterol (ADVAIR) 100-50 MCG/DOSE AEPB Inhale 1 puff into the lungs 2 (two) times daily as needed. For shortness of breath 60 each 3  . guaiFENesin (MUCINEX) 600 MG 12 hr tablet Take 1,200 mg by mouth 2 (two) times daily as needed. For chest tightness    . irbesartan (AVAPRO) 300 MG tablet Take 1  tablet by mouth at bedtime.     . Levomilnacipran HCl ER (FETZIMA) 40 MG CP24 Take by mouth at bedtime.    . Multiple Vitamin (MULTIVITAMIN) capsule Take 1 capsule by mouth daily.    . nebivolol (BYSTOLIC) 10 MG tablet Take 20 mg by mouth at bedtime.     [name] olopatadine (PATANOL) 0.1 % ophthalmic solution Place 1 drop into both eyes 2 (two) times daily as needed. For allergies 5 mL 3  . OVER THE COUNTER MEDICATION Vitamin B6 100mg  daily in the am    . rosuvastatin (CRESTOR) 20 MG tablet Take 20 mg by mouth at bedtime.      No current facility-administered medications for this visit.    Allergies  Allergen Reactions  . Sulfonamide Derivatives Other (See Comments)    Childhood allergy     History   Social History  . Marital Status: Single    Spouse Name: N/A    Number of Children: 1  . Years of Education: College   Occupational History  . Not on file.   Social History Main Topics  . Smoking status: Former Smoker -- 2.00 packs/day for 15 years    Types: Cigarettes    Quit date: 12/18/1978  . Smokeless tobacco: Never Used  . Alcohol Use: No  . Drug Use: No     Comment: hx of marijuana use   . Sexual Activity: Not on file   Other Topics Concern  . Not on file   Social History  Narrative   Patient is single.   Patient has a college education.   Patient drinks one caffeine drink per day.   Patient is right-handed.   Patient has one child.           Review of Systems: General: negative for chills, fever, night sweats or weight changes.  Cardiovascular: negative for chest pain, dyspnea on exertion, edema, orthopnea, palpitations, paroxysmal nocturnal dyspnea or shortness of breath Dermatological: negative for rash Respiratory: negative for cough or wheezing Urologic: negative for hematuria Abdominal: negative for nausea, vomiting, diarrhea, bright red blood per rectum, melena, or hematemesis Neurologic: negative for visual changes, syncope, or dizziness All other  systems reviewed and are otherwise negative except as noted above.    Blood pressure 110/78, pulse 66, height 5' 1.5" (1.562 m), weight 154 lb 1.6 oz (69.899 kg).  General appearance: alert and no distress Neck: no adenopathy, no carotid bruit, no JVD, supple, symmetrical, trachea midline and thyroid not enlarged, symmetric, no tenderness/mass/nodules Lungs: clear to auscultation bilaterally Heart: regular rate and rhythm, S1, S2 normal, no murmur, click, rub or gallop Extremities: extremities normal, atraumatic, no cyanosis or edema  EKG normal sinus rhythm at 66 without ST or T-wave changes. I personally reviewed the EKG  ASSESSMENT AND PLAN:   Essential hypertension History of hypertension with blood pressure today measuring 110/78 controlled on Avapro and by diastolic. We will continue her current medications.  OSA on CPAP Newly diagnosed obstructive sleep apnea by Dr. Fernande Bras with remarkable response to C Pap.  HYPERLIPIDEMIA History of hyperlipidemia on rosuvastatin followed by her primary care physician.      Lorretta Harp MD FACP,FACC,FAHA, [facility] 10/30/2014 8:53 AM

## 2014-11-06 ENCOUNTER — Other Ambulatory Visit: Payer: Self-pay | Admitting: *Deleted

## 2014-11-06 MED ORDER — FLUTICASONE-SALMETEROL 100-50 MCG/DOSE IN AEPB: 1.0000 | INHALATION_SPRAY | Freq: Two times a day (BID) | RESPIRATORY_TRACT | Status: DC | PRN

## 2014-12-15 ENCOUNTER — Telehealth: Payer: Self-pay | Admitting: Internal Medicine

## 2014-12-15 NOTE — Telephone Encounter (Signed)
Called and spoke with pt and she is on the scheduled to see CY on 12/31 at 11.  Pt is aware and nothing further is needed.

## 2014-12-15 NOTE — Telephone Encounter (Signed)
Ok to use my 12/31 or Wert's 12/30

## 2014-12-15 NOTE — Telephone Encounter (Signed)
Called and spoke to pt. Pt c/o prod cough with brown mucus (was white mucus), increase in SOB and chest tightness and congestion x 4 days. Pt is demanding appt and does not want phone rx. Dr. Annamaria Boots has blocked spot on 12/31 at 11:30 and Dr. Melvyn Novas has openings on 12/30 and 12/31.   Dr. Annamaria Boots please advise where pt can be worked in.

## 2014-12-17 ENCOUNTER — Encounter: Payer: Self-pay | Admitting: Internal Medicine

## 2014-12-17 ENCOUNTER — Ambulatory Visit (INDEPENDENT_AMBULATORY_CARE_PROVIDER_SITE_OTHER): Payer: Medicare Other | Admitting: Internal Medicine

## 2014-12-17 VITALS — BP 146/80 | HR 89 | Ht 61.0 in | Wt 150.0 lb

## 2014-12-17 DIAGNOSIS — J4521 Mild intermittent asthma with (acute) exacerbation: Secondary | ICD-10-CM

## 2014-12-17 DIAGNOSIS — J00 Acute nasopharyngitis [common cold]: Secondary | ICD-10-CM

## 2014-12-17 MED ORDER — METHYLPREDNISOLONE ACETATE 80 MG/ML IJ SUSP
80.0000 mg | Freq: Once | INTRAMUSCULAR | Status: AC
  Administered 2014-12-17: 80 mg via INTRAMUSCULAR

## 2014-12-17 MED ORDER — PROMETHAZINE-CODEINE 6.25-10 MG/5ML PO SYRP: 5.0000 mL | ORAL_SOLUTION | Freq: Four times a day (QID) | ORAL | Status: DC | PRN

## 2014-12-17 MED ORDER — AMOXICILLIN-POT CLAVULANATE 875-125 MG PO TABS: 1.0000 | ORAL_TABLET | Freq: Two times a day (BID) | ORAL | Status: DC

## 2014-12-17 NOTE — Progress Notes (Signed)
10/26/11- 65 yoF former smoker, followed for bronchitis, rhinitis, allergic conjunctivitis LOV-06/13/2010 She has had flu vaccine. Since last here has done very well from an allergy and respiratory standpoint. Patanol eyedrops and Flonase nasal spray help when needed.  03/28/12- 65 yoF former smoker, followed for bronchitis, rhinitis, allergic conjunctivitis  PCP Dr Dagmar Hait Reports a good winter with no major acute problems. Some easy exertional dyspnea climbing stairs but admits little routine exercise. Stop Advair 2 years ago and is noticing a little wheezing. She asks about restarting Advair. Coughs up small mucous plugs at times. Frontal headache. Using Flonase and Pataday nasal spray.  12/23/12- 57 yoF former smoker, followed for bronchitis, rhinitis, allergic conjunctivitis  PCP Dr Dagmar Hait FOLLOWS FOR: using Advair as needed. she doesn't have much energy and still gives out quickly. Has not been needing Advair regularly. She doesn't have a rescue inhaler and doesn't think she needs one. Little morning cough with scant mucus. Rhinitis controlled with Flonase, needs refill.  She is walking less because of arthritis and realizes she is deconditioned and gaining weight. She is pending hip replacement at the end of this month. Has had dizzy spells with sweating 2 weeks ago on first waking, and told her primary physician. Some tinnitus.  05/19/2013 Acute OV  Complains of prod cough with brown mucus, increased SOB, wheezing, chest tightness, bilat ear congestion, decreased SOB x5days.  Dr. Solon Augusta in prednisone , w/ last dose this am.  Feels some better but not back baseline.  Still has cough , congestion and nasal drainage.  Coughing up brown mucus.  No hemoptysis, chest pain , orthopnea, fever, n/v/d.  [place]ent to [place]. Returned last pm.  No calf pain or edema.  No otc used.   06/23/13- 66 yoF former smoker, followed for bronchitis, rhinitis, allergic conjunctivitis  PCP Dr  Dagmar Hait FOLLOWS FOR: pt states breathing is doing well, continues on advair-- denies any new concerns at this time Has done well since nebulizer treatment last visit with nurse practitioner area that going in and out of cool air/air conditioner bothers her.  06/29/14- 3 yoF former smoker, followed for asthma with bronchitis, rhinitis, allergic conjunctivitis, complicated by OSA/ GNA, GERD,   PCP Dr Dagmar Hait FOLLOWS FOR:  Breathing doing well.  Reports having cough non-productive for past 2 months Dry cough. Using Advair, Flonase. No wheeze, phlegm, reflux, nasal drainage. Cough started before she had sleep study for OSA by Neurology. Had not told me of concerns. Pleased with CPAP 7/?DME. Seeing fr Thornell Mule for tinnitus.   12/17/14- 15 yoF former smoker, followed for asthma with bronchitis, rhinitis, allergic conjunctivitis, complicated by OSA/ GNA, GERD,   PCP Dr Dagmar Hait ACUTE VISIT: pt c/o chest tightness, prod cough with brown mucus, facial pain behind teeth and cheeks, sinus congestion X5 days. Had flu shot. CPAP managed by Neurologist.  Now her teeth hurt, nose running, coughing some brown/green, aching.  ROS-see HPI Constitutional:   No-   weight loss, night sweats, fevers, chills, fatigue, lassitude. HEENT:   No-  headaches, difficulty swallowing, tooth/dental problems, sore throat,     No- sneezing, itching, ear ache,  +nasal congestion, post nasal drip,  CV:  No-   chest pain, orthopnea, PND, swelling in lower extremities, anasarca, dizziness, palpitations Resp: No-   shortness of breath with exertion or at rest.                No- coughing up of blood, productive cough-none, +nonproductive cough  No-   change in color of mucus.  + Occasional wheezing.   Skin: No-   rash or lesions. GI:  No-   heartburn, indigestion, abdominal pain, nausea, vomiting,  GU:   Neuro-     nothing unusual Psych:  No- change in mood or affect. No depression or anxiety.  No memory loss.  OBJ General-  Alert, Oriented, Affect-appropriate, Distress- none acute Skin- rash-none, lesions- none, excoriation- none Lymphadenopathy- none Head- atraumatic            Eyes- Gross vision intact, PERRLA, conjunctivae look pale, clear secretions            Ears- Hearing, canals-normal            Nose- + stuffy, no-Septal dev, mucus, polyps, erosion, perforation             Throat- Mallampati III , mucosa clear , drainage- none, tonsils- atrophic Neck- flexible , trachea midline, no stridor , thyroid nl, carotid no bruit Chest - symmetrical excursion , unlabored           Heart/CV- RRR , no murmur , no gallop  , no rub, nl s1 s2                           - JVD- none , edema- none, stasis changes- none, varices- none           Lung- clear to P&A, wheeze- none, cough+ harsh , dullness-none, rub- none           Chest wall-  Abd-  Br/ Gen/ Rectal- Not done, not indicated Extrem- cyanosis- none, clubbing, none, atrophy- none, strength- nl Neuro- grossly intact to observation. No nystagmus, and no carotid bruit.

## 2014-12-17 NOTE — Patient Instructions (Addendum)
Neb xop 0.63  Depo 80  Augmentin antibiotic prescription sent  Script printed for cough syrup

## 2014-12-18 NOTE — Assessment & Plan Note (Signed)
Acute upper respiratory infection with bronchitis. Potentially developing purulent sinusitis/tracheobronchitis. Doubt pneumonia. Plan-nebulizer treatments Xopenex, Depo-Medrol, Augmentin

## 2014-12-23 ENCOUNTER — Telehealth: Payer: Self-pay | Admitting: Internal Medicine

## 2014-12-23 MED ORDER — DIPHENOXYLATE-ATROPINE 2.5-0.025 MG PO TABS: 1.0000 | ORAL_TABLET | Freq: Four times a day (QID) | ORAL | Status: DC | PRN

## 2014-12-23 NOTE — Telephone Encounter (Signed)
Per CY- OK to refill lomotil 2.5mg  1-2 tabs every 6 hours prn, #10 with 1 refill.   Called and spoke to pt. Informed pt of the recs per CY. Rx sent to preferred pharmacy. Pt verbalized understanding and denied any further questions or concerns at this time.

## 2014-12-23 NOTE — Addendum Note (Signed)
Addended by: Maurice March on: 12/23/2014 05:20 PM   Modules accepted: Orders

## 2014-12-23 NOTE — Telephone Encounter (Signed)
Called and spoke to pt. Pt is requesting lomotil refill d/t abx that were scripted on 12/17/14 by CY.   Dr. Annamaria Boots are you ok refilling this or defer to PCP?

## 2014-12-29 DIAGNOSIS — H35371 Puckering of macula, right eye: Secondary | ICD-10-CM | POA: Diagnosis not present

## 2014-12-29 DIAGNOSIS — H2513 Age-related nuclear cataract, bilateral: Secondary | ICD-10-CM | POA: Diagnosis not present

## 2015-01-04 DIAGNOSIS — Z6827 Body mass index (BMI) 27.0-27.9, adult: Secondary | ICD-10-CM | POA: Diagnosis not present

## 2015-01-04 DIAGNOSIS — R809 Proteinuria, unspecified: Secondary | ICD-10-CM | POA: Diagnosis not present

## 2015-01-04 DIAGNOSIS — I10 Essential (primary) hypertension: Secondary | ICD-10-CM | POA: Diagnosis not present

## 2015-01-04 DIAGNOSIS — E785 Hyperlipidemia, unspecified: Secondary | ICD-10-CM | POA: Diagnosis not present

## 2015-01-04 DIAGNOSIS — G4733 Obstructive sleep apnea (adult) (pediatric): Secondary | ICD-10-CM | POA: Diagnosis not present

## 2015-01-04 DIAGNOSIS — E119 Type 2 diabetes mellitus without complications: Secondary | ICD-10-CM | POA: Diagnosis not present

## 2015-01-04 DIAGNOSIS — Z1389 Encounter for screening for other disorder: Secondary | ICD-10-CM | POA: Diagnosis not present

## 2015-01-06 ENCOUNTER — Ambulatory Visit: Payer: Medicare Other | Admitting: Neurology

## 2015-03-10 DIAGNOSIS — M5136 Other intervertebral disc degeneration, lumbar region: Secondary | ICD-10-CM | POA: Diagnosis not present

## 2015-03-10 DIAGNOSIS — M461 Sacroiliitis, not elsewhere classified: Secondary | ICD-10-CM | POA: Diagnosis not present

## 2015-03-31 DIAGNOSIS — M5136 Other intervertebral disc degeneration, lumbar region: Secondary | ICD-10-CM | POA: Diagnosis not present

## 2015-04-08 DIAGNOSIS — E119 Type 2 diabetes mellitus without complications: Secondary | ICD-10-CM | POA: Diagnosis not present

## 2015-04-08 DIAGNOSIS — I10 Essential (primary) hypertension: Secondary | ICD-10-CM | POA: Diagnosis not present

## 2015-04-08 DIAGNOSIS — Z6827 Body mass index (BMI) 27.0-27.9, adult: Secondary | ICD-10-CM | POA: Diagnosis not present

## 2015-04-08 DIAGNOSIS — F419 Anxiety disorder, unspecified: Secondary | ICD-10-CM | POA: Diagnosis not present

## 2015-04-08 DIAGNOSIS — G4733 Obstructive sleep apnea (adult) (pediatric): Secondary | ICD-10-CM | POA: Diagnosis not present

## 2015-04-08 DIAGNOSIS — R5383 Other fatigue: Secondary | ICD-10-CM | POA: Diagnosis not present

## 2015-04-08 DIAGNOSIS — J45909 Unspecified asthma, uncomplicated: Secondary | ICD-10-CM | POA: Diagnosis not present

## 2015-04-08 DIAGNOSIS — Z79899 Other long term (current) drug therapy: Secondary | ICD-10-CM | POA: Diagnosis not present

## 2015-04-08 DIAGNOSIS — G629 Polyneuropathy, unspecified: Secondary | ICD-10-CM | POA: Diagnosis not present

## 2015-07-01 ENCOUNTER — Ambulatory Visit: Payer: Medicare Other | Admitting: Internal Medicine

## 2015-07-15 ENCOUNTER — Ambulatory Visit: Payer: Self-pay | Admitting: Internal Medicine

## 2015-07-26 DIAGNOSIS — E785 Hyperlipidemia, unspecified: Secondary | ICD-10-CM | POA: Diagnosis not present

## 2015-07-26 DIAGNOSIS — E119 Type 2 diabetes mellitus without complications: Secondary | ICD-10-CM | POA: Diagnosis not present

## 2015-07-26 DIAGNOSIS — I10 Essential (primary) hypertension: Secondary | ICD-10-CM | POA: Diagnosis not present

## 2015-07-26 DIAGNOSIS — M859 Disorder of bone density and structure, unspecified: Secondary | ICD-10-CM | POA: Diagnosis not present

## 2015-08-02 DIAGNOSIS — G629 Polyneuropathy, unspecified: Secondary | ICD-10-CM | POA: Diagnosis not present

## 2015-08-02 DIAGNOSIS — M859 Disorder of bone density and structure, unspecified: Secondary | ICD-10-CM | POA: Diagnosis not present

## 2015-08-02 DIAGNOSIS — M5430 Sciatica, unspecified side: Secondary | ICD-10-CM | POA: Diagnosis not present

## 2015-08-02 DIAGNOSIS — M1990 Unspecified osteoarthritis, unspecified site: Secondary | ICD-10-CM | POA: Diagnosis not present

## 2015-08-02 DIAGNOSIS — G4733 Obstructive sleep apnea (adult) (pediatric): Secondary | ICD-10-CM | POA: Diagnosis not present

## 2015-08-02 DIAGNOSIS — Z0000 Encounter for general adult medical examination without abnormal findings: Secondary | ICD-10-CM | POA: Diagnosis not present

## 2015-08-02 DIAGNOSIS — E119 Type 2 diabetes mellitus without complications: Secondary | ICD-10-CM | POA: Diagnosis not present

## 2015-08-02 DIAGNOSIS — I10 Essential (primary) hypertension: Secondary | ICD-10-CM | POA: Diagnosis not present

## 2015-08-02 DIAGNOSIS — Z6825 Body mass index (BMI) 25.0-25.9, adult: Secondary | ICD-10-CM | POA: Diagnosis not present

## 2015-08-02 DIAGNOSIS — F419 Anxiety disorder, unspecified: Secondary | ICD-10-CM | POA: Diagnosis not present

## 2015-08-02 DIAGNOSIS — Z1389 Encounter for screening for other disorder: Secondary | ICD-10-CM | POA: Diagnosis not present

## 2015-08-02 DIAGNOSIS — H35371 Puckering of macula, right eye: Secondary | ICD-10-CM | POA: Diagnosis not present

## 2015-08-02 DIAGNOSIS — H2513 Age-related nuclear cataract, bilateral: Secondary | ICD-10-CM | POA: Diagnosis not present

## 2015-08-02 DIAGNOSIS — E785 Hyperlipidemia, unspecified: Secondary | ICD-10-CM | POA: Diagnosis not present

## 2015-08-16 ENCOUNTER — Telehealth: Payer: Self-pay | Admitting: Cardiovascular Disease

## 2015-08-19 DIAGNOSIS — M545 Low back pain: Secondary | ICD-10-CM | POA: Diagnosis not present

## 2015-08-19 NOTE — Telephone Encounter (Signed)
Close encounter 

## 2015-08-24 ENCOUNTER — Ambulatory Visit: Payer: Medicare Other | Admitting: Neurology

## 2015-08-25 DIAGNOSIS — Z6825 Body mass index (BMI) 25.0-25.9, adult: Secondary | ICD-10-CM | POA: Diagnosis not present

## 2015-08-25 DIAGNOSIS — I10 Essential (primary) hypertension: Secondary | ICD-10-CM | POA: Diagnosis not present

## 2015-08-25 DIAGNOSIS — M5430 Sciatica, unspecified side: Secondary | ICD-10-CM | POA: Diagnosis not present

## 2015-08-25 DIAGNOSIS — Z23 Encounter for immunization: Secondary | ICD-10-CM | POA: Diagnosis not present

## 2015-08-26 DIAGNOSIS — H2511 Age-related nuclear cataract, right eye: Secondary | ICD-10-CM | POA: Diagnosis not present

## 2015-08-26 DIAGNOSIS — H25811 Combined forms of age-related cataract, right eye: Secondary | ICD-10-CM | POA: Diagnosis not present

## 2015-08-31 ENCOUNTER — Ambulatory Visit (INDEPENDENT_AMBULATORY_CARE_PROVIDER_SITE_OTHER): Payer: Medicare Other | Admitting: Neurology

## 2015-08-31 ENCOUNTER — Encounter: Payer: Self-pay | Admitting: Neurology

## 2015-08-31 VITALS — BP 138/80 | HR 68 | Resp 20 | Ht 61.0 in | Wt 148.0 lb

## 2015-08-31 DIAGNOSIS — R413 Other amnesia: Secondary | ICD-10-CM | POA: Diagnosis not present

## 2015-08-31 DIAGNOSIS — H9319 Tinnitus, unspecified ear: Secondary | ICD-10-CM | POA: Insufficient documentation

## 2015-08-31 DIAGNOSIS — H9311 Tinnitus, right ear: Secondary | ICD-10-CM | POA: Diagnosis not present

## 2015-08-31 DIAGNOSIS — G4733 Obstructive sleep apnea (adult) (pediatric): Secondary | ICD-10-CM

## 2015-08-31 DIAGNOSIS — M545 Low back pain: Secondary | ICD-10-CM | POA: Diagnosis not present

## 2015-08-31 DIAGNOSIS — M5136 Other intervertebral disc degeneration, lumbar region: Secondary | ICD-10-CM | POA: Diagnosis not present

## 2015-08-31 DIAGNOSIS — Z9989 Dependence on other enabling machines and devices: Principal | ICD-10-CM

## 2015-08-31 DIAGNOSIS — M5116 Intervertebral disc disorders with radiculopathy, lumbar region: Secondary | ICD-10-CM | POA: Diagnosis not present

## 2015-08-31 NOTE — Progress Notes (Signed)
[facility]   Provider:  Larey Seat, M D  Referring Provider: Prince Solian, MD Primary Care Physician:  Tivis Ringer, MD  Chief Complaint  Patient presents with  . Follow-up    cpap, rm 10, alone    HPI:  Amber Roman is a 69 y.o. female , seen here as a  revisit  from Dr. Dagmar Hait for CPAP compliance.   Chief complaint according to patient : " [place] and did well with my CPAP " ." I have trouble with finding the right words.",  " I have to move slower to not get dizzy"  CPAP In office download on 08-31-15 reveals very good compliance at 87% of over 4 hours of daily use. Her machine is on average used for 8 hours and 3 minutes. CPAP is set at 7 cm water pressure was 2 cm EPR she does have a minor air leak, her AHI is 4.8. Her Epworth sleepiness score is endorsed at 3 points fatigue severity at 20 points and geriatric depression at one point. She doesn't feel depressed anymore she has spent a good summer in the mountains. She states that she is still unsteady and that was the tinnitus and balance problems that she complained about last year she has learned to live with. She is just more careful as she walks she had to be careful.  She had fallen once but she stumbled over a branch so there was a mechanical barrier at fault. She had no other falls in the house or outdoors. She had no fractures or injuries. In terms of memory she feels sometimes scattered or having trouble to find the right word especially when she feels under time pressure, or socially awkward. [name]    08-19-14 ;  last visit with compliant of acute onset of some tinnitus. She was evaluated in detail by ENT physician Dr. Thornell Mule. He performed an electronystagmogram which was within normal limits velocity, latency and accuracy of saccades, optokinetic responses Dix-Hallpike maneuver caloric irrigation, hearing and external ear examination were normal and a brain MRI has been  normal.no diplopia, no vertigo .  I can certainly rule out a stroke as causing this patient's problems and it seems that from the ear nose and throat perspective, there is no explanation for the tinnitus either. She is unsteady.  In office download of CPAP revealed an AHI of 4.7 a user time of 7 hours and 32 minutes at home on average as a pressure of 7 cm with an EPR level of 2 and over the last 76 days and compliance of 93%.   Sleep habits are as follows: bed time is between 10 and 11 PM, falls asleep after 45 minutes. Sleeps alone, with her dog. Wake up spontaneously since retired.       Review of Systems: Out of a complete 14 system review, the patient complains of only the following symptoms, and all other reviewed systems are negative. See above      Social History   Social History  . Marital Status: Single    Spouse Name: N/A  . Number of Children: 1  . Years of Education: College   Occupational History  . Not on file.   Social History Main Topics  . Smoking status: Former Smoker -- 2.00 packs/day for 15 years    Types: Cigarettes    Quit date: 12/18/1978  . Smokeless tobacco: Never Used  . Alcohol Use: No  . Drug  Use: No     Comment: hx of marijuana use   . Sexual Activity: Not on file   Other Topics Concern  . Not on file   Social History Narrative   Patient is single.   Patient has a college education.   Patient drinks one caffeine drink per day.   Patient is right-handed.   Patient has one child.          Family History  Problem Relation Age of Onset  . Heart attack Father   . High blood pressure Father   . Multiple sclerosis Father   . Lung disease Mother   . Colon cancer Brother   . Colon cancer Brother   . High blood pressure Brother   . Diabetes Mellitus II Sister   . High blood pressure Sister   . Obesity Sister     Past Medical History  Diagnosis Date  . Allergic conjunctivitis   . Esophageal reflux   . Bronchitis   . Uterine  cancer     hysterectomy  . Hyperlipidemia   . Hypertension   . Depression   . Arthritis   . Family history of heart disease   . Bruxism, sleep-related 04/13/2014  . Insomnia 04/13/2014  . H/O abdominal hysterectomy     w/o recurrence  . Obstructive sleep apnea     improved on C Pap    Past Surgical History  Procedure Laterality Date  . Nasal septum surgery    . Total abdominal hysterectomy    . Bilateral salpingoophorectomy    . Carpal tunnel release      bilateral  . Total hip arthroplasty  01/10/2013    Procedure: TOTAL HIP ARTHROPLASTY ANTERIOR APPROACH;  Surgeon: Mcarthur Rossetti, MD;  Location: [facility];  Service: Orthopedics;  Laterality: Right;  . Nm myoview ltd    . Doppler echocardiography      Current Outpatient Prescriptions  Medication Sig Dispense Refill  . aspirin 81 MG tablet Take 81 mg by mouth daily.    . diphenoxylate-atropine (LOMOTIL) 2.5-0.025 MG per tablet Take 1 tablet by mouth 4 (four) times daily as needed. For stomach    . diphenoxylate-atropine (LOMOTIL) 2.5-0.025 MG per tablet Take 1-2 tablets by mouth every 6 (six) hours as needed for diarrhea or loose stools. 10 tablet 1  . fluticasone (FLONASE) 50 MCG/ACT nasal spray Place 2 sprays into the nose daily. As needed 16 g prn  . Fluticasone-Salmeterol (ADVAIR) 100-50 MCG/DOSE AEPB Inhale 1 puff into the lungs 2 (two) times daily as needed. For shortness of breath 60 each 11  . guaiFENesin (MUCINEX) 600 MG 12 hr tablet Take 1,200 mg by mouth 2 (two) times daily as needed. For chest tightness    . irbesartan (AVAPRO) 300 MG tablet Take 1 tablet by mouth at bedtime.     . nebivolol (BYSTOLIC) 10 MG tablet Take 20 mg by mouth at bedtime.     [name] olopatadine (PATANOL) 0.1 % ophthalmic solution Place 1 drop into both eyes 2 (two) times daily as needed. For allergies 5 mL 3  . OVER THE COUNTER MEDICATION Vitamin B6 100mg  daily in the am    . rosuvastatin (CRESTOR) 20 MG tablet Take 20 mg by mouth at bedtime.       No current facility-administered medications for this visit.    Allergies as of 08/31/2015 - Review Complete 08/31/2015  Allergen Reaction Noted  . Sulfonamide derivatives Other (See Comments)     Vitals: BP 138/80 mmHg  Pulse 68  Resp 20  Ht 5\' 1"  (1.549 m)  Wt 148 lb (67.132 kg)  BMI 27.98 kg/m2 Last Weight:  Wt Readings from Last 1 Encounters:  08/31/15 148 lb (67.132 kg)   AYT:KZSW mass index is 27.98 kg/(m^2).     Last Height:   Ht Readings from Last 1 Encounters:  08/31/15 5\' 1"  (1.549 m)    Physical exam:  General: The patient is awake, alert and appears not in acute distress. The patient is well groomed. Head: Normocephalic, atraumatic. Neck is supple. Mallampati 3,  neck circumference:16.5  Nasal airflow untrestricted, the is a nasal septal deviation , TMJ click not  evident . Retrognathia is seen.  Cardiovascular:  Regular rate and rhythm  without  murmurs or carotid bruit, and without distended neck veins. Respiratory: Lungs are clear to auscultation. Skin:  Without evidence of edema, or rash Trunk: BMI is elevated . The patient's posture is slightly hunched.   Neurologic exam : The patient is awake and alert, oriented to place and time.   Memory subjectivedescribed as intact.  Memory testing revealed word finding delay.  MOCA: The patient did very well with the visual spatial and executive function, the naming she recalled 3 out of 5 words, platelet attention was able to subtract and read a list of letters abstract, and was fully oriented to chamber time and place. She did have trouble generating 11 words or more beginning was a specific letter. Generated 9 words.     Attention span & concentration ability appears normal.  Speech is fluent,  without  dysarthria, dysphonia or aphasia.  Mood and affect are appropriate.  Cranial nerves: Pupils are equal and briskly reactive to light. Funduscopic exam without  evidence of pallor or edema.  She just had a  cataract lens replacement on the right eye and is awaiting the left to be surgically altered and of this month. Extraocular movements  in vertical and horizontal planes intact . There is a horizontal nystagmus and while performing as smooth pursuit she jumps in the center of her visual field.. Visual fields by finger perimetry are intact. Hearing to finger rub intact.   Facial sensation intact to fine touch.  Facial motor strength is symmetric and tongue and uvula move midline. Shoulder shrug was symmetrical.   Motor exam: Normal tone, muscle bulk and symmetric strength in all extremities.  Sensory:  Fine touch, pinprick and vibration were tested in all extremities. Proprioception tested in the upper extremities was normal.  Coordination: Rapid alternating movements in the fingers/hands was normal. Finger-to-nose maneuver  normal without evidence of ataxia, dysmetria or tremor.  Gait and station: Patient walks without assistive device and is able unassisted to climb up to the exam table. Strength within normal limits.  Stance is stable and normal.  Toe and hell stand were tested .Tandem gait is fragmented. Turns with  4-5   Steps. Romberg testing is  negative.  Deep tendon reflexes: in the  upper and lower extremities are symmetric and intact. Babinski maneuver response is downgoing.  The patient was advised of the nature of the diagnosed sleep disorder , the treatment options and risks for general a health and wellness arising from not treating the condition.  I spent more than 25 minutes of face to face time with the patient. Greater than 50% of time was spent in counseling and coordination of care. We have discussed the diagnosis and differential and I answered the patient's questions.     Assessment:  After physical and neurologic examination, review of laboratory studies,  Personal review of imaging studies, reports of other /same  Imaging studies ,  Results of polysomnography/  neurophysiology testing and pre-existing records as far as provided in visit., my assessment is   1) is associated with tinnitus manifests now as a very mild imbalance. Her Romberg test is negative she does have a slight horizontal nystagmus. She had no recent falls and she has adjusted and adapted. The tinnitus is not bothering her nearly as much as last year.  2) OSA is well treated on CPAP with a residual AHI of 4.8 at 7 cm water pressure was 2 cm EPR  3) Montral cognitive assessment test for word finding difficulties; t the patient has scored 27 out of 30 points, which is normal for age and her degree of education. Her word generation was the only difficulty I could identify. She did especially well with the trail making test, subtraction and orientation. She recalled 3 out of 5 delayed recall words.    Plan:  Treatment plan and additional workup :  Continue CPAP, no changes in settings necessary. The patient has become a very careful walker and she has learned to live with the tinnitus and the imbalance feeling it is no longer hampering her ability to function in daily life. As to the word finding difficulties there at this time not enough to be called mild cognitive impairment but I would like to continue testing her each year when she comes back for her compliance visit.  CC Dr. Hortencia Conradi Jessiah Steinhart MD  08/31/2015   CC: Prince Solian, [address], [place] [address]

## 2015-09-15 DIAGNOSIS — M5136 Other intervertebral disc degeneration, lumbar region: Secondary | ICD-10-CM | POA: Diagnosis not present

## 2015-09-15 DIAGNOSIS — M5116 Intervertebral disc disorders with radiculopathy, lumbar region: Secondary | ICD-10-CM | POA: Diagnosis not present

## 2015-09-16 DIAGNOSIS — H2512 Age-related nuclear cataract, left eye: Secondary | ICD-10-CM | POA: Diagnosis not present

## 2015-09-16 DIAGNOSIS — H25812 Combined forms of age-related cataract, left eye: Secondary | ICD-10-CM | POA: Diagnosis not present

## 2015-09-20 DIAGNOSIS — C4492 Squamous cell carcinoma of skin, unspecified: Secondary | ICD-10-CM

## 2015-09-20 DIAGNOSIS — C44629 Squamous cell carcinoma of skin of left upper limb, including shoulder: Secondary | ICD-10-CM | POA: Diagnosis not present

## 2015-09-20 HISTORY — DX: Squamous cell carcinoma of skin, unspecified: C44.92

## 2015-09-21 DIAGNOSIS — C44621 Squamous cell carcinoma of skin of unspecified upper limb, including shoulder: Secondary | ICD-10-CM | POA: Diagnosis not present

## 2015-09-29 DIAGNOSIS — M5136 Other intervertebral disc degeneration, lumbar region: Secondary | ICD-10-CM | POA: Diagnosis not present

## 2015-09-29 DIAGNOSIS — M5116 Intervertebral disc disorders with radiculopathy, lumbar region: Secondary | ICD-10-CM | POA: Diagnosis not present

## 2015-10-25 ENCOUNTER — Encounter: Payer: Self-pay | Admitting: Internal Medicine

## 2015-10-25 ENCOUNTER — Ambulatory Visit (INDEPENDENT_AMBULATORY_CARE_PROVIDER_SITE_OTHER): Payer: Medicare Other | Admitting: Internal Medicine

## 2015-10-25 VITALS — BP 118/60 | HR 67 | Ht 61.0 in | Wt 152.8 lb

## 2015-10-25 DIAGNOSIS — J4521 Mild intermittent asthma with (acute) exacerbation: Secondary | ICD-10-CM | POA: Diagnosis not present

## 2015-10-25 DIAGNOSIS — G4733 Obstructive sleep apnea (adult) (pediatric): Secondary | ICD-10-CM | POA: Diagnosis not present

## 2015-10-25 DIAGNOSIS — Z9989 Dependence on other enabling machines and devices: Principal | ICD-10-CM

## 2015-10-25 MED ORDER — ALBUTEROL SULFATE HFA 108 (90 BASE) MCG/ACT IN AERS: 2.0000 | INHALATION_SPRAY | Freq: Four times a day (QID) | RESPIRATORY_TRACT | Status: DC | PRN

## 2015-10-25 NOTE — Progress Notes (Signed)
10/26/11- 65 yoF former smoker, followed for bronchitis, rhinitis, allergic conjunctivitis LOV-06/13/2010 She has had flu vaccine. Since last here has done very well from an allergy and respiratory standpoint. Patanol eyedrops and Flonase nasal spray help when needed.  03/28/12- 65 yoF former smoker, followed for bronchitis, rhinitis, allergic conjunctivitis  PCP Dr Dagmar Hait Reports a good winter with no major acute problems. Some easy exertional dyspnea climbing stairs but admits little routine exercise. Stop Advair 2 years ago and is noticing a little wheezing. She asks about restarting Advair. Coughs up small mucous plugs at times. Frontal headache. Using Flonase and Pataday nasal spray.  12/23/12- 52 yoF former smoker, followed for bronchitis, rhinitis, allergic conjunctivitis  PCP Dr Dagmar Hait FOLLOWS FOR: using Advair as needed. she doesn't have much energy and still gives out quickly. Has not been needing Advair regularly. She doesn't have a rescue inhaler and doesn't think she needs one. Little morning cough with scant mucus. Rhinitis controlled with Flonase, needs refill.  She is walking less because of arthritis and realizes she is deconditioned and gaining weight. She is pending hip replacement at the end of this month. Has had dizzy spells with sweating 2 weeks ago on first waking, and told her primary physician. Some tinnitus.  05/19/2013 Acute OV  Complains of prod cough with brown mucus, increased SOB, wheezing, chest tightness, bilat ear congestion, decreased SOB x5days.  Dr. Solon Augusta in prednisone , w/ last dose this am.  Feels some better but not back baseline.  Still has cough , congestion and nasal drainage.  Coughing up brown mucus.  No hemoptysis, chest pain , orthopnea, fever, n/v/d.  [place] to [place]. Returned last pm.  No calf pain or edema.  No otc used.   06/23/13- 66 yoF former smoker, followed for bronchitis, rhinitis, allergic conjunctivitis  PCP Dr  Dagmar Hait FOLLOWS FOR: pt states breathing is doing well, continues on advair-- denies any new concerns at this time Has done well since nebulizer treatment last visit with nurse practitioner area that going in and out of cool air/air conditioner bothers her.  06/29/14- 74 yoF former smoker, followed for asthma with bronchitis, rhinitis, allergic conjunctivitis, complicated by OSA/ GNA, GERD,   PCP Dr Dagmar Hait FOLLOWS FOR:  Breathing doing well.  Reports having cough non-productive for past 2 months Dry cough. Using Advair, Flonase. No wheeze, phlegm, reflux, nasal drainage. Cough started before she had sleep study for OSA by Neurology. Had not told me of concerns. Pleased with CPAP 7/?DME. Seeing fr Thornell Mule for tinnitus.   12/17/14- 70 yoF former smoker, followed for asthma with bronchitis, rhinitis, allergic conjunctivitis, complicated by OSA/ GNA, GERD,   PCP Dr Dagmar Hait ACUTE VISIT: pt c/o chest tightness, prod cough with brown mucus, facial pain behind teeth and cheeks, sinus congestion X5 days. Had flu shot. CPAP managed by Neurologist.  Now her teeth hurt, nose running, coughing some brown/green, aching.  10/25/15- 69 year old female former smoker followed for asthma with bronchitis, rhinitis, allergic conjunctivitis, complicated by OSA(GNA), GERD    pcp Dr Dagmar Hait FOLLOWS FOR: Pt has done well overall until last few days-pt states its been harder to breathe-slight chest tightness. Mild nonspecific chest tightness for the last few days but does not think she has a cold and has not noticed wheezing or cough. Using Advair intermittently, does not have a rescue inhaler now.[name] Respiratory vaccines are given by her primary physician  ROS-see HPI Constitutional:   No-   weight loss, night sweats, fevers, chills,  fatigue, lassitude. HEENT:   No-  headaches, difficulty swallowing, tooth/dental problems, sore throat,     No- sneezing, itching, ear ache,  +nasal congestion, post nasal drip,  CV:  No-   chest pain,  orthopnea, PND, swelling in lower extremities, anasarca, dizziness, palpitations Resp: No-   shortness of breath with exertion or at rest.                No- coughing up of blood, productive cough-none, +nonproductive cough            No-   change in color of mucus.  + Occasional wheezing.   Skin: No-   rash or lesions. GI:  No-   heartburn, indigestion, abdominal pain, nausea, vomiting,  GU:   Neuro-     nothing unusual Psych:  No- change in mood or affect. No depression or anxiety.  No memory loss.  OBJ General- Alert, Oriented, Affect-appropriate, Distress- none acute Skin- rash-none, lesions- none, excoriation- none Lymphadenopathy- none Head- atraumatic            Eyes- Gross vision intact, PERRLA, conjunctivae look pale, clear secretions            Ears- Hearing, canals-normal            Nose- , no-Septal dev, mucus, polyps, erosion, perforation             Throat- Mallampati III , mucosa clear , drainage- none, tonsils- atrophic Neck- flexible , trachea midline, no stridor , thyroid nl, carotid no bruit Chest - symmetrical excursion , unlabored           Heart/CV- RRR , no murmur , no gallop  , no rub, nl s1 s2                           - JVD- none , edema- none, stasis changes- none, varices- none           Lung- clear to P&A, wheeze- none, cough+ harsh , dullness-none, rub- none           Chest wall-  Abd-  Br/ Gen/ Rectal- Not done, not indicated Extrem- cyanosis- none, clubbing, none, atrophy- none, strength- nl Neuro- grossly intact to observation. No nystagmus, and no carotid bruit.

## 2015-10-25 NOTE — Patient Instructions (Signed)
Script for albuterol rescue inhaler to use for occasional chest tightness, wheeze, shortness of breath  The Advair disk works best as a maintenance controller if you need to use something every day, because it builds with repeated use.

## 2015-10-31 NOTE — Assessment & Plan Note (Signed)
She reports she is compliant with CPAP. It is being managed by neurology

## 2015-10-31 NOTE — Assessment & Plan Note (Signed)
Very slight dyspnea/chest tightness. She has been using Advair as a rescue inhaler. Education done. Plan-replace Advair with a rescue albuterol inhaler for occasional use

## 2015-11-02 ENCOUNTER — Encounter: Payer: Self-pay | Admitting: Cardiovascular Disease

## 2015-11-02 ENCOUNTER — Ambulatory Visit (INDEPENDENT_AMBULATORY_CARE_PROVIDER_SITE_OTHER): Payer: Medicare Other | Admitting: Cardiovascular Disease

## 2015-11-02 VITALS — BP 144/62 | HR 64 | Ht 61.0 in | Wt 150.0 lb

## 2015-11-02 DIAGNOSIS — I10 Essential (primary) hypertension: Secondary | ICD-10-CM | POA: Diagnosis not present

## 2015-11-02 NOTE — Assessment & Plan Note (Signed)
History of hypertension blood pressure measured at 144/62. She is on Avapro and by [employer] . Continue current meds at current dosing

## 2015-11-02 NOTE — Progress Notes (Signed)
11/02/2015 Amber Roman   1946-07-13  [id number]  Primary Physician Tivis Ringer, MD Primary Cardiologist: Lorretta Harp MD [name]   HPI:  The patient is a 69 year old, fit-appearing, divorced Caucasian female, mother of one 45-1/2-year-old [place] adopted daughter named [name] who she adopted when she was 69 years old and is currently gradually from  [place]. I last saw her a year ago. She has a history of hypertension, hyperlipidemia, and a strong family history for heart disease with a father who had his first MI at age 81 and died 46 years later of a massive myocardial infarction. Her last Myoview stress test performed 10/25/12 was not ischemic.She has never had a heart attack or stroke, otherwise is asymptomatic. Her other problems include uterine cancer having undergone a total abdominal hysterectomy without recurrence 12 years ago. Dr. Dagmar Hait follows her lipid profile. She had an uncomplicated total hip replacement by Dr. Zollie Beckers 01/09/2013. Since I saw her back she denies chest pain or shortness of breath. She was diagnosed obstructive sleep apnea by Dr. Fernande Bras and has been a remarkable responder to CPAP.   Current Outpatient Prescriptions  Medication Sig Dispense Refill  . albuterol (PROVENTIL HFA;VENTOLIN HFA) 108 (90 BASE) MCG/ACT inhaler Inhale 2 puffs into the lungs every 6 (six) hours as needed for wheezing or shortness of breath. 1 Inhaler 6  . aspirin 81 MG tablet Take 81 mg by mouth daily.    . diphenoxylate-atropine (LOMOTIL) 2.5-0.025 MG per tablet Take 1 tablet by mouth 4 (four) times daily as needed. For stomach    . DULoxetine (CYMBALTA) 60 MG capsule Take 1 capsule by mouth daily.  1  . fluticasone (FLONASE) 50 MCG/ACT nasal spray Place 2 sprays into the nose daily. As needed 16 g prn  . Fluticasone-Salmeterol (ADVAIR) 100-50 MCG/DOSE AEPB Inhale 1 puff into the lungs 2 (two) times daily as needed. For shortness of breath 60 each 11  .  guaiFENesin (MUCINEX) 600 MG 12 hr tablet Take 1,200 mg by mouth 2 (two) times daily as needed. For chest tightness    . irbesartan (AVAPRO) 300 MG tablet Take 1 tablet by mouth at bedtime.     . nebivolol (BYSTOLIC) 10 MG tablet Take 20 mg by mouth at bedtime.     [name] olopatadine (PATANOL) 0.1 % ophthalmic solution Place 1 drop into both eyes 2 (two) times daily as needed. For allergies 5 mL 3  . OVER THE COUNTER MEDICATION Vitamin B6 100mg  daily in the am    . rosuvastatin (CRESTOR) 20 MG tablet Take 20 mg by mouth at bedtime.      No current facility-administered medications for this visit.    Allergies  Allergen Reactions  . Sulfonamide Derivatives Other (See Comments)    Childhood allergy     Social History   Social History  . Marital Status: Single    Spouse Name: N/A  . Number of Children: 1  . Years of Education: College   Occupational History  . Not on file.   Social History Main Topics  . Smoking status: Former Smoker -- 2.00 packs/day for 15 years    Types: Cigarettes    Quit date: 12/18/1978  . Smokeless tobacco: Never Used  . Alcohol Use: No  . Drug Use: No     Comment: hx of marijuana use   . Sexual Activity: Not on file   Other Topics Concern  . Not on file   Social History Narrative   Patient  is single.   Patient has a college education.   Patient drinks one caffeine drink per day.   Patient is right-handed.   Patient has one child.           Review of Systems: General: negative for chills, fever, night sweats or weight changes.  Cardiovascular: negative for chest pain, dyspnea on exertion, edema, orthopnea, palpitations, paroxysmal nocturnal dyspnea or shortness of breath Dermatological: negative for rash Respiratory: negative for cough or wheezing Urologic: negative for hematuria Abdominal: negative for nausea, vomiting, diarrhea, bright red blood per rectum, melena, or hematemesis Neurologic: negative for visual changes, syncope, or  dizziness All other systems reviewed and are otherwise negative except as noted above.    Blood pressure 144/62, pulse 64, height 5\' 1"  (1.549 m), weight 150 lb (68.04 kg).  General appearance: alert and no distress Neck: no adenopathy, no carotid bruit, no JVD, supple, symmetrical, trachea midline and thyroid not enlarged, symmetric, no tenderness/mass/nodules Lungs: clear to auscultation bilaterally Heart: regular rate and rhythm, S1, S2 normal, no murmur, click, rub or gallop Extremities: extremities normal, atraumatic, no cyanosis or edema  EKG sinus rhythm at 64 without ST or T-wave changes. There was reverse R-wave progression. I personally reviewed this EKG  ASSESSMENT AND PLAN:   HYPERLIPIDEMIA History of hyperlipidemia on Crestor followed by her PCP  Essential hypertension History of hypertension blood pressure measured at 144/62. She is on Avapro and by [employer] . Continue current meds at current dosing      Lorretta Harp MD [facility], [facility] 11/02/2015 10:20 AM

## 2015-11-02 NOTE — Assessment & Plan Note (Signed)
History of hyperlipidemia on Crestor followed by her PCP 

## 2015-11-02 NOTE — Patient Instructions (Signed)

## 2015-11-03 DIAGNOSIS — M545 Low back pain: Secondary | ICD-10-CM | POA: Diagnosis not present

## 2015-11-03 DIAGNOSIS — M5116 Intervertebral disc disorders with radiculopathy, lumbar region: Secondary | ICD-10-CM | POA: Diagnosis not present

## 2015-11-04 ENCOUNTER — Encounter: Payer: Self-pay | Admitting: Cardiovascular Disease

## 2015-11-10 DIAGNOSIS — M654 Radial styloid tenosynovitis [de Quervain]: Secondary | ICD-10-CM | POA: Diagnosis not present

## 2015-11-10 DIAGNOSIS — M75102 Unspecified rotator cuff tear or rupture of left shoulder, not specified as traumatic: Secondary | ICD-10-CM | POA: Diagnosis not present

## 2015-11-22 DIAGNOSIS — M545 Low back pain: Secondary | ICD-10-CM | POA: Diagnosis not present

## 2015-11-22 DIAGNOSIS — Z1231 Encounter for screening mammogram for malignant neoplasm of breast: Secondary | ICD-10-CM | POA: Diagnosis not present

## 2015-11-22 DIAGNOSIS — M5116 Intervertebral disc disorders with radiculopathy, lumbar region: Secondary | ICD-10-CM | POA: Diagnosis not present

## 2015-11-26 DIAGNOSIS — M5430 Sciatica, unspecified side: Secondary | ICD-10-CM | POA: Diagnosis not present

## 2015-11-26 DIAGNOSIS — R8299 Other abnormal findings in urine: Secondary | ICD-10-CM | POA: Diagnosis not present

## 2015-11-26 DIAGNOSIS — N390 Urinary tract infection, site not specified: Secondary | ICD-10-CM | POA: Diagnosis not present

## 2015-11-26 DIAGNOSIS — G629 Polyneuropathy, unspecified: Secondary | ICD-10-CM | POA: Diagnosis not present

## 2015-11-26 DIAGNOSIS — E119 Type 2 diabetes mellitus without complications: Secondary | ICD-10-CM | POA: Diagnosis not present

## 2015-11-26 DIAGNOSIS — G4733 Obstructive sleep apnea (adult) (pediatric): Secondary | ICD-10-CM | POA: Diagnosis not present

## 2015-11-26 DIAGNOSIS — Z6826 Body mass index (BMI) 26.0-26.9, adult: Secondary | ICD-10-CM | POA: Diagnosis not present

## 2015-11-26 DIAGNOSIS — I10 Essential (primary) hypertension: Secondary | ICD-10-CM | POA: Diagnosis not present

## 2015-12-06 ENCOUNTER — Telehealth: Payer: Self-pay | Admitting: Internal Medicine

## 2015-12-06 ENCOUNTER — Encounter: Payer: Self-pay | Admitting: Internal Medicine

## 2015-12-06 ENCOUNTER — Ambulatory Visit: Payer: Medicare Other | Admitting: Internal Medicine

## 2015-12-06 DIAGNOSIS — J4521 Mild intermittent asthma with (acute) exacerbation: Secondary | ICD-10-CM

## 2015-12-06 MED ORDER — AZITHROMYCIN 250 MG PO TABS: ORAL_TABLET | ORAL | Status: DC

## 2015-12-06 MED ORDER — METHYLPREDNISOLONE ACETATE 80 MG/ML IJ SUSP
80.0000 mg | Freq: Once | INTRAMUSCULAR | Status: AC
  Administered 2015-12-06: 80 mg via INTRAMUSCULAR

## 2015-12-06 MED ORDER — LEVALBUTEROL HCL 0.63 MG/3ML IN NEBU
0.6300 mg | INHALATION_SOLUTION | Freq: Once | RESPIRATORY_TRACT | Status: AC
  Administered 2015-12-06: 0.63 mg via RESPIRATORY_TRACT

## 2015-12-06 NOTE — Patient Instructions (Addendum)
Script sent for Z pak  Try Mucinex-DM   Stay hydrated, warm enough and try to avoid getting exhausted  Neb xop 0.63  Call if we can help

## 2015-12-06 NOTE — Telephone Encounter (Signed)
Called, spoke with pt. C/o cough and nasal congestion with brownish green mucus, increased SOB, chest tightness, bilateral ear pressure, HA, sore throat, and laryngitis x few days.  Denies fever but states she feels "miserable."  Is taking mucinex and using lemon h2o with honey.  Has not used albuterol hfa. Requesting OV today - scheduled to see CY at 11:30 today.  Pt confirmed appt, was very grateful, and voiced no further questions or concerns at this time.

## 2015-12-06 NOTE — Progress Notes (Signed)
10/26/11- 65 yoF former smoker, followed for bronchitis, rhinitis, allergic conjunctivitis LOV-06/13/2010 She has had flu vaccine. Since last here has done very well from an allergy and respiratory standpoint. Patanol eyedrops and Flonase nasal spray help when needed.  03/28/12- 65 yoF former smoker, followed for bronchitis, rhinitis, allergic conjunctivitis  PCP Dr Dagmar Hait Reports a good winter with no major acute problems. Some easy exertional dyspnea climbing stairs but admits little routine exercise. Stop Advair 2 years ago and is noticing a little wheezing. She asks about restarting Advair. Coughs up small mucous plugs at times. Frontal headache. Using Flonase and Pataday nasal spray.  12/23/12- 71 yoF former smoker, followed for bronchitis, rhinitis, allergic conjunctivitis  PCP Dr Dagmar Hait FOLLOWS FOR: using Advair as needed. she doesn't have much energy and still gives out quickly. Has not been needing Advair regularly. She doesn't have a rescue inhaler and doesn't think she needs one. Little morning cough with scant mucus. Rhinitis controlled with Flonase, needs refill.  She is walking less because of arthritis and realizes she is deconditioned and gaining weight. She is pending hip replacement at the end of this month. Has had dizzy spells with sweating 2 weeks ago on first waking, and told her primary physician. Some tinnitus.  05/19/2013 Acute OV  Complains of prod cough with brown mucus, increased SOB, wheezing, chest tightness, bilat ear congestion, decreased SOB x5days.  Dr. Solon [place] in prednisone , w/ last dose this am.  Feels some better but not back baseline.  Still has cough , congestion and nasal drainage.  Coughing up brown mucus.  No hemoptysis, chest pain , orthopnea, fever, n/v/d.  [place] to [place]. Returned last pm.  No calf pain or edema.  No otc used.   06/23/13- 66 yoF former smoker, followed for bronchitis, rhinitis, allergic conjunctivitis  PCP Dr  Dagmar Hait FOLLOWS FOR: pt states breathing is doing well, continues on advair-- denies any new concerns at this time Has done well since nebulizer treatment last visit with nurse practitioner area that going in and out of cool air/air conditioner bothers her.  06/29/14- 47 yoF former smoker, followed for asthma with bronchitis, rhinitis, allergic conjunctivitis, complicated by OSA/ GNA, GERD,   PCP Dr Dagmar Hait FOLLOWS FOR:  Breathing doing well.  Reports having cough non-productive for past 2 months Dry cough. Using Advair, Flonase. No wheeze, phlegm, reflux, nasal drainage. Cough started before she had sleep study for OSA by Neurology. Had not told me of concerns. Pleased with CPAP 7/?DME. Seeing fr Thornell Mule for tinnitus.   12/17/14- 21 yoF former smoker, followed for asthma with bronchitis, rhinitis, allergic conjunctivitis, complicated by OSA/ GNA, GERD,   PCP Dr Dagmar Hait ACUTE VISIT: pt c/o chest tightness, prod cough with brown mucus, facial pain behind teeth and cheeks, sinus congestion X5 days. Had flu shot. CPAP managed by Neurologist.  Now her teeth hurt, nose running, coughing some brown/green, aching.  10/25/15- 69 year old female former smoker followed for asthma with bronchitis, rhinitis, allergic conjunctivitis, complicated by OSA(GNA), GERD    pcp Dr Dagmar Hait FOLLOWS FOR: Pt has done well overall until last few days-pt states its been harder to breathe-slight chest tightness. Mild nonspecific chest tightness for the last few days but does not think she has a cold and has not noticed wheezing or cough. Using Advair intermittently, does not have a rescue inhaler now.[name] Respiratory vaccines are given by her primary physician  12/06/2015-69 year old female former smoker followed for asthma/bronchitis, rhinitis, allergic conjunctivitis, complicated by OSA (GNA),  GERD Acute Visit Follows For: Pt c/o headaches, sinus pressure, chest congestion, prod cough with green and brown mucus, wheezing and fatigue x5 days      ROS-see HPI Constitutional:   No-   weight loss, night sweats, fevers, chills, fatigue, lassitude. HEENT:   No-  headaches, difficulty swallowing, tooth/dental problems, sore throat,     No- sneezing, itching, ear ache,  +nasal congestion, post nasal drip,  CV:  No-   chest pain, orthopnea, PND, swelling in lower extremities, anasarca, dizziness, palpitations Resp: No-   shortness of breath with exertion or at rest.                No- coughing up of blood, productive cough-none, +nonproductive cough            No-   change in color of mucus.  + Occasional wheezing.   Skin: No-   rash or lesions. GI:  No-   heartburn, indigestion, abdominal pain, nausea, vomiting,  GU:   Neuro-     nothing unusual Psych:  No- change in mood or affect. No depression or anxiety.  No memory loss.  OBJ General- Alert, Oriented, Affect-appropriate, Distress- none acute Skin- rash-none, lesions- none, excoriation- none Lymphadenopathy- none Head- atraumatic            Eyes- Gross vision intact, PERRLA, conjunctivae look pale, clear secretions            Ears- Hearing, canals-normal            Nose- , no-Septal dev, mucus, polyps, erosion, perforation             Throat- Mallampati III , mucosa clear , drainage- none, tonsils- atrophic Neck- flexible , trachea midline, no stridor , thyroid nl, carotid no bruit Chest - symmetrical excursion , unlabored           Heart/CV- RRR , no murmur , no gallop  , no rub, nl s1 s2                           - JVD- none , edema- none, stasis changes- none, varices- none           Lung- clear to P&A, wheeze- none, cough+ harsh , dullness-none, rub- none           Chest wall-  Abd-  Br/ Gen/ Rectal- Not done, not indicated Extrem- cyanosis- none, clubbing, none, atrophy- none, strength- nl Neuro- grossly intact to observation. No nystagmus, and no carotid bruit.

## 2015-12-08 DIAGNOSIS — M5116 Intervertebral disc disorders with radiculopathy, lumbar region: Secondary | ICD-10-CM | POA: Diagnosis not present

## 2016-01-05 DIAGNOSIS — M67431 Ganglion, right wrist: Secondary | ICD-10-CM | POA: Diagnosis not present

## 2016-01-14 DIAGNOSIS — M5116 Intervertebral disc disorders with radiculopathy, lumbar region: Secondary | ICD-10-CM | POA: Diagnosis not present

## 2016-01-14 DIAGNOSIS — M545 Low back pain: Secondary | ICD-10-CM | POA: Diagnosis not present

## 2016-03-20 DIAGNOSIS — E784 Other hyperlipidemia: Secondary | ICD-10-CM | POA: Diagnosis not present

## 2016-03-20 DIAGNOSIS — E119 Type 2 diabetes mellitus without complications: Secondary | ICD-10-CM | POA: Diagnosis not present

## 2016-03-20 DIAGNOSIS — Z1389 Encounter for screening for other disorder: Secondary | ICD-10-CM | POA: Diagnosis not present

## 2016-03-20 DIAGNOSIS — I10 Essential (primary) hypertension: Secondary | ICD-10-CM | POA: Diagnosis not present

## 2016-03-20 DIAGNOSIS — Z6828 Body mass index (BMI) 28.0-28.9, adult: Secondary | ICD-10-CM | POA: Diagnosis not present

## 2016-03-20 DIAGNOSIS — R0609 Other forms of dyspnea: Secondary | ICD-10-CM | POA: Diagnosis not present

## 2016-03-20 DIAGNOSIS — J45909 Unspecified asthma, uncomplicated: Secondary | ICD-10-CM | POA: Diagnosis not present

## 2016-03-20 DIAGNOSIS — G4733 Obstructive sleep apnea (adult) (pediatric): Secondary | ICD-10-CM | POA: Diagnosis not present

## 2016-03-20 DIAGNOSIS — J309 Allergic rhinitis, unspecified: Secondary | ICD-10-CM | POA: Diagnosis not present

## 2016-03-21 DIAGNOSIS — M4806 Spinal stenosis, lumbar region: Secondary | ICD-10-CM | POA: Diagnosis not present

## 2016-03-29 DIAGNOSIS — M545 Low back pain: Secondary | ICD-10-CM | POA: Diagnosis not present

## 2016-03-29 DIAGNOSIS — M5116 Intervertebral disc disorders with radiculopathy, lumbar region: Secondary | ICD-10-CM | POA: Diagnosis not present

## 2016-03-29 DIAGNOSIS — M4806 Spinal stenosis, lumbar region: Secondary | ICD-10-CM | POA: Diagnosis not present

## 2016-04-07 DIAGNOSIS — L500 Allergic urticaria: Secondary | ICD-10-CM | POA: Diagnosis not present

## 2016-05-29 DIAGNOSIS — M4806 Spinal stenosis, lumbar region: Secondary | ICD-10-CM | POA: Diagnosis not present

## 2016-05-31 DIAGNOSIS — M5116 Intervertebral disc disorders with radiculopathy, lumbar region: Secondary | ICD-10-CM | POA: Diagnosis not present

## 2016-05-31 DIAGNOSIS — M4806 Spinal stenosis, lumbar region: Secondary | ICD-10-CM | POA: Diagnosis not present

## 2016-05-31 DIAGNOSIS — M545 Low back pain: Secondary | ICD-10-CM | POA: Diagnosis not present

## 2016-06-06 DIAGNOSIS — Z8542 Personal history of malignant neoplasm of other parts of uterus: Secondary | ICD-10-CM | POA: Insufficient documentation

## 2016-06-06 DIAGNOSIS — D119 Benign neoplasm of major salivary gland, unspecified: Secondary | ICD-10-CM | POA: Insufficient documentation

## 2016-06-06 DIAGNOSIS — J309 Allergic rhinitis, unspecified: Secondary | ICD-10-CM | POA: Insufficient documentation

## 2016-06-06 DIAGNOSIS — H93299 Other abnormal auditory perceptions, unspecified ear: Secondary | ICD-10-CM | POA: Insufficient documentation

## 2016-06-06 DIAGNOSIS — R42 Dizziness and giddiness: Secondary | ICD-10-CM | POA: Insufficient documentation

## 2016-06-06 DIAGNOSIS — C55 Malignant neoplasm of uterus, part unspecified: Secondary | ICD-10-CM | POA: Insufficient documentation

## 2016-06-06 DIAGNOSIS — E7800 Pure hypercholesterolemia, unspecified: Secondary | ICD-10-CM | POA: Insufficient documentation

## 2016-07-05 DIAGNOSIS — L299 Pruritus, unspecified: Secondary | ICD-10-CM | POA: Diagnosis not present

## 2016-07-05 DIAGNOSIS — H6122 Impacted cerumen, left ear: Secondary | ICD-10-CM | POA: Insufficient documentation

## 2016-08-08 DIAGNOSIS — M25571 Pain in right ankle and joints of right foot: Secondary | ICD-10-CM | POA: Diagnosis not present

## 2016-08-08 DIAGNOSIS — M2041 Other hammer toe(s) (acquired), right foot: Secondary | ICD-10-CM | POA: Diagnosis not present

## 2016-08-10 DIAGNOSIS — N390 Urinary tract infection, site not specified: Secondary | ICD-10-CM | POA: Diagnosis not present

## 2016-08-10 DIAGNOSIS — M8589 Other specified disorders of bone density and structure, multiple sites: Secondary | ICD-10-CM | POA: Diagnosis not present

## 2016-08-10 DIAGNOSIS — E784 Other hyperlipidemia: Secondary | ICD-10-CM | POA: Diagnosis not present

## 2016-08-10 DIAGNOSIS — M859 Disorder of bone density and structure, unspecified: Secondary | ICD-10-CM | POA: Diagnosis not present

## 2016-08-10 DIAGNOSIS — I10 Essential (primary) hypertension: Secondary | ICD-10-CM | POA: Diagnosis not present

## 2016-08-10 DIAGNOSIS — R8299 Other abnormal findings in urine: Secondary | ICD-10-CM | POA: Diagnosis not present

## 2016-08-10 DIAGNOSIS — E119 Type 2 diabetes mellitus without complications: Secondary | ICD-10-CM | POA: Diagnosis not present

## 2016-08-17 DIAGNOSIS — G629 Polyneuropathy, unspecified: Secondary | ICD-10-CM | POA: Diagnosis not present

## 2016-08-17 DIAGNOSIS — J309 Allergic rhinitis, unspecified: Secondary | ICD-10-CM | POA: Diagnosis not present

## 2016-08-17 DIAGNOSIS — G4733 Obstructive sleep apnea (adult) (pediatric): Secondary | ICD-10-CM | POA: Diagnosis not present

## 2016-08-17 DIAGNOSIS — Z23 Encounter for immunization: Secondary | ICD-10-CM | POA: Diagnosis not present

## 2016-08-17 DIAGNOSIS — Z0000 Encounter for general adult medical examination without abnormal findings: Secondary | ICD-10-CM | POA: Diagnosis not present

## 2016-08-17 DIAGNOSIS — M1990 Unspecified osteoarthritis, unspecified site: Secondary | ICD-10-CM | POA: Diagnosis not present

## 2016-08-17 DIAGNOSIS — E119 Type 2 diabetes mellitus without complications: Secondary | ICD-10-CM | POA: Diagnosis not present

## 2016-08-17 DIAGNOSIS — J45909 Unspecified asthma, uncomplicated: Secondary | ICD-10-CM | POA: Diagnosis not present

## 2016-08-17 DIAGNOSIS — Z1389 Encounter for screening for other disorder: Secondary | ICD-10-CM | POA: Diagnosis not present

## 2016-08-17 DIAGNOSIS — I10 Essential (primary) hypertension: Secondary | ICD-10-CM | POA: Diagnosis not present

## 2016-08-17 DIAGNOSIS — K635 Polyp of colon: Secondary | ICD-10-CM | POA: Diagnosis not present

## 2016-08-17 DIAGNOSIS — K589 Irritable bowel syndrome without diarrhea: Secondary | ICD-10-CM | POA: Diagnosis not present

## 2016-08-28 ENCOUNTER — Ambulatory Visit: Payer: Medicare Other | Admitting: Neurology

## 2016-09-04 ENCOUNTER — Encounter: Payer: Self-pay | Admitting: Neurology

## 2016-09-04 ENCOUNTER — Ambulatory Visit (INDEPENDENT_AMBULATORY_CARE_PROVIDER_SITE_OTHER): Payer: Medicare Other | Admitting: Neurology

## 2016-09-04 VITALS — BP 120/72 | HR 68 | Resp 20 | Ht 61.5 in | Wt 153.0 lb

## 2016-09-04 DIAGNOSIS — H8309 Labyrinthitis, unspecified ear: Secondary | ICD-10-CM | POA: Insufficient documentation

## 2016-09-04 DIAGNOSIS — G4733 Obstructive sleep apnea (adult) (pediatric): Secondary | ICD-10-CM | POA: Diagnosis not present

## 2016-09-04 DIAGNOSIS — H9311 Tinnitus, right ear: Secondary | ICD-10-CM

## 2016-09-04 DIAGNOSIS — H8301 Labyrinthitis, right ear: Secondary | ICD-10-CM

## 2016-09-04 DIAGNOSIS — Z9989 Dependence on other enabling machines and devices: Secondary | ICD-10-CM

## 2016-09-04 NOTE — Progress Notes (Signed)
[facility]   Provider:  Larey Seat, M D  Referring Provider: Prince Solian, MD Primary Care Physician:  Tivis Ringer, MD  Chief Complaint  Patient presents with  . Follow-up    cpap, memory, uses [facility]    HPI:  Amber Roman is a 70 y.o. female , seen here as a  revisit  from Dr. Dagmar Hait for CPAP compliance.   Chief complaint according to patient : " [place] and did well with my CPAP " ." I have trouble with finding the right words.",  " I have to move slower to not get dizzy"  CPAP In office download on 08-31-15 reveals very good compliance at 87% of over 4 hours of daily use. Her machine is on average used for 8 hours and 3 minutes. CPAP is set at 7 cm water pressure was 2 cm EPR she does have a minor air leak, her AHI is 4.8. Her Epworth sleepiness score is endorsed at 3 points fatigue severity at 20 points and geriatric depression at one point. She doesn't feel depressed anymore she has spent a good [place] in the mountains. She states that she is still unsteady and that was the tinnitus and balance problems that she complained about last year she has learned to live with. She is just more careful as she walks she had to be careful.  She had fallen once but she stumbled over a branch so there was a mechanical barrier at fault. She had no other falls in the house or outdoors. She had no fractures or injuries. In terms of memory she feels sometimes scattered or having trouble to find the right word especially when she feels under time pressure, or socially awkward. [name]   08-19-14 ;  last visit with compliant of acute onset of some tinnitus. She was evaluated in detail by ENT physician Dr. Thornell Mule. He performed an electronystagmogram which was within normal limits velocity, latency and accuracy of saccades, optokinetic responses Dix-Hallpike maneuver caloric irrigation, hearing and external ear examination were normal and a brain MRI has been  normal.no diplopia, no vertigo .  I can certainly rule out a stroke as causing this patient's problems and it seems that from the ear nose and throat perspective, there is no explanation for the tinnitus either. She is unsteady.  In office download of CPAP revealed an AHI of 4.7 a user time of 7 hours and 32 minutes at home on average as a pressure of 7 cm with an EPR level of 2 and over the last 76 days and compliance of 93%. Sleep habits are as follows: bed time is between 10 and 11 PM, falls asleep after 45 minutes. Sleeps alone, with her dog. Wake up spontaneously since retired.    Interval history from 09/04/2016, as the pleasure of seeing Mrs. Gately today who states that she has had spells of dizziness with rapid head movements, numbness and loss of sensory in the big toe of the right foot but no concerns related to sleep or memory. She does have tinnitus. The dizziness is clearly vertigo. She has seen an ear nose and throat specialist, Dr. Thornell Mule for hearing testing and evaluation, but she has not had vestibular treatments. She announced that she will not tolerate a caloric test. Interval history for her CPAP the patient's 90% compliance 27th of days out of the last 30 she has used her CPAP average daytime use is 7 hours and 52 minutes,  set pressure is 7 cm water with an EPR level of 2, residual AHI is 4.1. They will air leak is noted. Epworth sleepiness score endorsed at 1, fatigue severity at 35 points and geriatric depression score at one point patient also underwent a Montral cognitive assessment this 28 out of 30 points well in normal limits. She missed two uncued  points in delayed recall.      Review of Systems: Out of a complete 14 system review, the patient complains of only the following symptoms, and all other reviewed systems are negative. See above     Social History   Social History  . Marital status: Single    Spouse name: N/A  . Number of children: 1  . Years of  education: College   Occupational History  . Not on file.   Social History Main Topics  . Smoking status: Former Smoker    Packs/day: 2.00    Years: 15.00    Types: Cigarettes    Quit date: 12/18/1978  . Smokeless tobacco: Never Used  . Alcohol use No  . Drug use: No     Comment: hx of marijuana use   . Sexual activity: Not on file   Other Topics Concern  . Not on file   Social History Narrative   Patient is single.   Patient has a college education.   Patient drinks one caffeine drink per day.   Patient is right-handed.   Patient has one child.          Family History  Problem Relation Age of Onset  . Heart attack Father   . High blood pressure Father   . Multiple sclerosis Father   . Lung disease Mother   . Colon cancer Brother   . Colon cancer Brother   . High blood pressure Brother   . Diabetes Mellitus II Sister   . High blood pressure Sister   . Obesity Sister     Past Medical History:  Diagnosis Date  . Allergic conjunctivitis   . Arthritis   . Bronchitis   . Bruxism, sleep-related 04/13/2014  . Depression   . Esophageal reflux   . Family history of heart disease   . H/O abdominal hysterectomy    w/o recurrence  . Hyperlipidemia   . Hypertension   . Insomnia 04/13/2014  . Obstructive sleep apnea    improved on C Pap  . Uterine cancer [facility])    hysterectomy    Past Surgical History:  Procedure Laterality Date  . BILATERAL SALPINGOOPHORECTOMY    . CARPAL TUNNEL RELEASE     bilateral  . DOPPLER ECHOCARDIOGRAPHY    . NASAL SEPTUM SURGERY    . NM MYOVIEW LTD    . TOTAL ABDOMINAL HYSTERECTOMY    . TOTAL HIP ARTHROPLASTY  01/10/2013   Procedure: TOTAL HIP ARTHROPLASTY ANTERIOR APPROACH;  Surgeon: Mcarthur Rossetti, MD;  Location: [facility];  Service: Orthopedics;  Laterality: Right;    Current Outpatient Prescriptions  Medication Sig Dispense Refill  . albuterol (PROVENTIL HFA;VENTOLIN HFA) 108 (90 BASE) MCG/ACT inhaler Inhale 2 puffs into  the lungs every 6 (six) hours as needed for wheezing or shortness of breath. 1 Inhaler 6  . aspirin 81 MG tablet Take 81 mg by mouth daily.    . diphenoxylate-atropine (LOMOTIL) 2.5-0.025 MG per tablet Take 1 tablet by mouth 4 (four) times daily as needed. For stomach    . DULoxetine (CYMBALTA) 60 MG capsule Take 1 capsule by mouth daily.  1  . fluticasone (FLONASE) 50 MCG/ACT nasal spray Place 2 sprays into the nose daily. As needed 16 g prn  . Fluticasone-Salmeterol (ADVAIR) 100-50 MCG/DOSE AEPB Inhale 1 puff into the lungs 2 (two) times daily as needed. For shortness of breath 60 each 11  . guaiFENesin (MUCINEX) 600 MG 12 hr tablet Take 1,200 mg by mouth 2 (two) times daily as needed. For chest tightness    . irbesartan (AVAPRO) 300 MG tablet Take 1 tablet by mouth at bedtime.     . meloxicam (MOBIC) 15 MG tablet 15 mg daily.  0  . olopatadine (PATANOL) 0.1 % ophthalmic solution Place 1 drop into both eyes 2 (two) times daily as needed. For allergies 5 mL 3  . OVER THE COUNTER MEDICATION Vitamin B6 100mg  daily in the am    . rosuvastatin (CRESTOR) 20 MG tablet Take 20 mg by mouth at bedtime.      No current facility-administered medications for this visit.     Allergies as of 09/04/2016 - Review Complete 09/04/2016  Allergen Reaction Noted  . Sulfonamide derivatives Other (See Comments)     Vitals: BP 120/72   Pulse 68   Resp 20   Ht 5' 1.5" (1.562 m)   Wt 153 lb (69.4 kg)   BMI 28.44 kg/m  Last Weight:  Wt Readings from Last 1 Encounters:  09/04/16 153 lb (69.4 kg)   [id number] mass index is 28.44 kg/m.     Last Height:   Ht Readings from Last 1 Encounters:  09/04/16 5' 1.5" (1.562 m)    Physical exam:  General: The patient is awake, alert and appears not in acute distress. The patient is well groomed. Head: Normocephalic, atraumatic. Neck is supple. Mallampati 3,  neck circumference:16.5  Nasal airflow untrestricted, the is a nasal septal deviation , TMJ click not   evident . Retrognathia is seen.  Cardiovascular:  Regular rate and rhythm  without  murmurs or carotid bruit, and without distended neck veins. Respiratory: Lungs are clear to auscultation. Skin:  Without evidence of edema, or rash Trunk: BMI is elevated . The patient's posture is slightly hunched.   Neurologic exam : The patient is awake and alert, oriented to place and time.   Memory subjectivedescribed as intact.  Memory testing revealed word finding delay.  MOCA:  Montreal Cognitive Assessment  09/04/2016  Visuospatial/ Executive (0/5) 5  Naming (0/3) 3  Attention: Read list of digits (0/2) 2  Attention: Read list of letters (0/1) 1  Attention: Serial 7 subtraction starting at 100 (0/3) 3  Language: Repeat phrase (0/2) 2  Language : Fluency (0/1) 1  Abstraction (0/2) 2  Delayed Recall (0/5) 3  Orientation (0/6) 6  Total 28  Adjusted Score (based on education) 28    Attention span & concentration ability appears normal.  Speech is fluent,  without  dysarthria, dysphonia or aphasia.  Mood and affect are appropriate.  Cranial nerves: Pupils are equal and briskly reactive to light. She just had a cataract lens replacement on the right eye and is awaiting the left to be surgically altered and of this month. Extraocular movements  in vertical and horizontal planes intact . She was able to pursue a moving object without developing nystagmus.  Visual fields by finger perimetry are intact. Her hearing is intact to finger rub.  Facial motor strength is symmetric and tongue and uvula move midline. Shoulder shrug was symmetrical.   Motor exam: Normal tone, muscle bulk and symmetric strength in  all extremities.  The patient was advised of the nature of the diagnosed sleep disorder , the treatment options and risks for general a health and wellness arising from not treating the condition.  I spent more than 25 minutes of face to face time with the patient. Greater than 50% of time was  spent in counseling and coordination of care. We have discussed the diagnosis and differential and I answered the patient's questions.     Assessment:  After physical and neurologic examination, review of laboratory studies,  Personal review of imaging studies, reports of other /same  Imaging studies ,  Results of polysomnography/ neurophysiology testing and pre-existing records as far as provided in visit., my assessment is   1) is associated with tinnitus manifests now as VERTIGO . Her Romberg test is negative . She had no recent falls and she has adjusted and adapted.  The tinnitus is not bothering her nearly as much as last year. Referral to vestibular rehab. Clockwise rotation of the surrounding.   2) OSA is well treated on CPAP with a residual AHI of 4.1 at 7 cm water pressure was 2 cm EPR  3) Montral cognitive assessment test for word finding difficulties; t the patient has scored 28 out of 30 points, which is normal for age and her degree of education.    Plan:  Treatment plan and additional workup :  Continue CPAP, no changes in settings necessary. The patient has become a very careful walker and she has learned to live with the tinnitus and the imbalance feeling it is no longer hampering her ability to function in daily life.   As to the word finding difficulties there at this time not enough to be called mild cognitive impairment but I would like to continue testing her each year when she comes back for her compliance visit.  As to her vertigo it seems to be generated by the right in her ear, and not the posterior cochlea. Bending down and extending her neck does not lead to the same vertigo reaction as rotating left and right and a lateral tilt is also not provoking. RV with Np after vestibular rehab .  CC Dr. Hortencia Conradi Jenefer Woerner MD  09/04/2016   CC: Prince Solian, [address] [place], [place] [address]

## 2016-09-04 NOTE — Patient Instructions (Signed)
Vertigo Vertigo means that you feel like you are moving when you are not. Vertigo can also make you feel like things around you are moving when they are not. This feeling can come and go at any time. Vertigo often goes away on its own. HOME CARE  Avoid making fast movements.  Avoid driving.  Avoid using heavy machinery.  Avoid doing any task or activity that might cause danger to you or other people if you would have a vertigo attack while you are doing it.  Sit down right away if you feel dizzy or have trouble with your balance.  Take over-the-counter and prescription medicines only as told by your doctor.  Follow instructions from your doctor about which positions or movements you should avoid.  Drink enough fluid to keep your pee (urine) clear or pale yellow.  Keep all follow-up visits as told by your doctor. This is important. GET HELP IF:  Medicine does not help your vertigo.  You have a fever.  Your problems get worse or you have new symptoms.  Your family or friends see changes in your behavior.  You feel sick to your stomach (nauseous) or you throw up (vomit).  You have a "pins and needles" feeling or you are numb in part of your body. GET HELP RIGHT AWAY IF:  You have trouble moving or talking.  You are always dizzy.  You pass out (faint).  You get very bad headaches.  You feel weak or have trouble using your hands, arms, or legs.  You have changes in your hearing.  You have changes in your seeing (vision).  You get a stiff neck.  Bright light starts to bother you.   This information is not intended to replace advice given to you by your health care provider. Make sure you discuss any questions you have with your health care provider.   Document Released: 09/12/2008 Document Revised: 08/25/2015 Document Reviewed: 03/29/2015 Elsevier Interactive Patient Education 2016 [employer] Inc.  

## 2016-09-06 ENCOUNTER — Encounter: Payer: Self-pay | Admitting: *Deleted

## 2016-09-11 ENCOUNTER — Ambulatory Visit: Payer: Medicare Other | Attending: Neurology | Admitting: Rehabilitative and Restorative Service Providers"

## 2016-09-11 DIAGNOSIS — R2689 Other abnormalities of gait and mobility: Secondary | ICD-10-CM | POA: Diagnosis not present

## 2016-09-11 DIAGNOSIS — R42 Dizziness and giddiness: Secondary | ICD-10-CM | POA: Insufficient documentation

## 2016-09-11 NOTE — Patient Instructions (Signed)
Feet Together, Varied Arm Positions - Eyes Closed    Stand with feet together and arms at your side. Close eyes and visualize upright position. Hold __30__ seconds. Repeat __3__ times per session. Do _2___ sessions per day.  Copyright  VHI. All rights reserved.   Feet Heel-Toe "Tandem", Varied Arm Positions - Eyes Open    With eyes open, right foot directly in front of the other, arms out, look straight ahead at a stationary object. Hold __30__ seconds. Repeat _3___ times per session. Do __2__ sessions per day.  Copyright  VHI. All rights reserved.   Feet Together, Head Motion - Eyes Open    With eyes open, feet together, move head slowly: up and down x 5 times.  Then repeat side to side x 5 times. Do _2___ sessions per day.  Copyright  VHI. All rights reserved.   SINGLE LIMB STANCE    Stance: single leg on floor. Raise leg. Hold _10__ seconds. Repeat with other leg. __3_ reps , 2 times per day. Copyright  VHI. All rights reserved.

## 2016-09-11 NOTE — Therapy (Signed)
[name] [address] [place], [place], [address] Phone: [phone]   Fax:  [phone]  Physical Therapy Evaluation  Patient Details  Name: Amber Roman MRN: [id number] Date of Birth: 1946-12-07 Referring Provider: Larey Seat, MD  Encounter Date: 09/11/2016      PT End of Session - 09/11/16 1941    Visit Number 1   Number of Visits 1   Date for PT Re-Evaluation --   Authorization Type --   PT Start Time 1320   PT Stop Time 1400   PT Time Calculation (min) 40 min   Activity Tolerance Patient tolerated treatment well   Behavior During Therapy [facility] for tasks assessed/performed      Past Medical History:  Diagnosis Date  . Allergic conjunctivitis   . Arthritis   . Bronchitis   . Bruxism, sleep-related 04/13/2014  . Depression   . Esophageal reflux   . Family history of heart disease   . H/O abdominal hysterectomy    w/o recurrence  . Hyperlipidemia   . Hypertension   . Insomnia 04/13/2014  . Obstructive sleep apnea    improved on C Pap  . Uterine cancer [facility])    hysterectomy    Past Surgical History:  Procedure Laterality Date  . BILATERAL SALPINGOOPHORECTOMY    . CARPAL TUNNEL RELEASE     bilateral  . DOPPLER ECHOCARDIOGRAPHY    . NASAL SEPTUM SURGERY    . NM MYOVIEW LTD    . TOTAL ABDOMINAL HYSTERECTOMY    . TOTAL HIP ARTHROPLASTY  01/10/2013   Procedure: TOTAL HIP ARTHROPLASTY ANTERIOR APPROACH;  Surgeon: Mcarthur Rossetti, MD;  Location: [facility];  Service: Orthopedics;  Laterality: Right;    There were no vitals filed for this visit.       Subjective Assessment - 09/11/16 1323    Subjective The patient reports dizziness at times with rolling in bed (happened twice).  She denies dizziness day to day.   She is not currently having episodes of vertigo, but does note that she feels certain activities may provoke symptoms.    Pertinent History Family h/o meniere's disease.  Pt has  tinnitus and ear fullness (worse [place]   Patient Stated Goals Unsure.   Currently in Pain? No/denies            [facility] PT Assessment - 09/11/16 1327      Assessment   Medical Diagnosis vertigo   Referring Provider Larey Seat, MD   Onset Date/Surgical Date --  episodes close in timeframe, 3 months ago   Prior Therapy none     Precautions   Precautions None     Restrictions   Weight Bearing Restrictions No     Balance Screen   Has the patient fallen in the past 6 months Yes   How many times? 3  "not in a number of months."   Has the patient had a decrease in activity level because of a fear of falling?  No   Is the patient reluctant to leave their home because of a fear of falling?  No     Home Social worker Private residence   Home Access Stairs to enter   Entrance Stairs-Number of Steps 2   Entrance Stairs-Rails Can reach both   [place] One level     Prior Function   Level of Independence Independent     Observation/Other Assessments   Dizziness Handicap  Inventory [facility])  36%     Sensation   Additional Comments patient reports sensory changes in feet due to neuropathy; also notes wearing 2 different contact lenses for near/far vision + h/o episodic vertigo.      Posture/Postural Control   Posture/Postural Control No significant limitations     Ambulation/Gait   Ambulation/Gait Yes   Ambulation/Gait Assistance 7: Independent   Ambulation Distance (Feet) 200 Feet   Assistive device None   Gait Pattern Within Functional Limits   Ambulation Surface Level   Gait velocity 3.53 ft/sec   Stairs Yes   Stairs Assistance 6: Modified independent (Device/Increase time)   Stair Management Technique One rail Right;Alternating pattern   Number of Stairs 12     Standardized Balance Assessment   Standardized Balance Assessment Dynamic Gait Index     Dynamic Gait Index   Level Surface Normal   Change in  Gait Speed Normal   Gait with Horizontal Head Turns Mild Impairment   Gait with Vertical Head Turns Mild Impairment   Gait and Pivot Turn Normal   Step Over Obstacle Normal   Step Around Obstacles Normal   Steps Mild Impairment   Total Score 21   DGI comment: 21/24     High Level Balance   High Level Balance Comments Patient unable to maintian tandem stance; she maintains single limb stance x 2 seconds bilaterally.  She has difficulty with static balance with eyes closed.  Provided via HEP.            Vestibular Assessment - 09/11/16 1331      Vestibular Assessment   General Observation The patient has tinnitus.  She wants to avoid all nausea today--has appt this afternoon.     Symptom Behavior   Type of Dizziness Spinning  none day to day, spinning in the night   Frequency of Dizziness rare, episodes in history   Duration of Dizziness seconds to minutes   Aggravating Factors Turning body quickly  bending forward   Relieving Factors No known relieving factors     Occulomotor Exam   Occulomotor Alignment Normal   Spontaneous Absent   Gaze-induced Absent   Smooth Pursuits Intact   Saccades Intact     Vestibulo-Occular Reflex   VOR 1 Head Only (x 1 viewing) patient able to perform at slow pace   Comment head impulse test WNLs.      Positional Testing   Dix-Hallpike Dix-Hallpike Right;Dix-Hallpike Left   Sidelying Test Sidelying Right;Sidelying Left   Horizontal Canal Testing Horizontal Canal Right;Horizontal Canal Left     Dix-Hallpike Right   Dix-Hallpike Right Duration none   Dix-Hallpike Right Symptoms No nystagmus  patient felt mild sense of dizziness when returning to sit     Dix-Hallpike Left   Dix-Hallpike Left Duration none   Dix-Hallpike Left Symptoms No nystagmus     Sidelying Right   Sidelying Right Duration none   Sidelying Right Symptoms No nystagmus  senses mild discomfort coming up from R     Sidelying Left   Sidelying Left Duration none    Sidelying Left Symptoms No nystagmus     Horizontal Canal Right   Horizontal Canal Right Duration none   Horizontal Canal Right Symptoms Normal     Horizontal Canal Left   Horizontal Canal Left Duration none   Horizontal Canal Left Symptoms Normal               [facility] Adult PT Treatment/Exercise - 09/11/16 1327  Neuro Re-ed    Neuro Re-ed Details  HEP for balance --see patient instructions.                PT Education - 09/11/16 1940    Education provided Yes   Education Details HEP: single leg stance, feet together eyes closed, tandem stance, standing feet together with head motion.    Person(s) Educated Patient   Methods Explanation;Demonstration;Handout   Comprehension Verbalized understanding;Returned demonstration                    Plan - 09/11/16 2028    Clinical Impression Statement The patient is a 70 yo female presenting to OP rehab with h/o episodic vertigo (one spell that lasted minutes followed by days of unsteadiness, 2 intermittent spells lasting seconds during rolling in bed).  During today's evaluation, her VOR appears WNLs and she is negative for positional testing.  She does have difficulty with multi-sensory balance challenges including eyes closed on solid and compliant surfaces.  The patient also has dec'd balance control during single limb stance and narrow base of support standing.  PT provided HEP.  The patient feels she can perform HEP and f/u by phone with PT as needed.  No further treatment scheduled at this time.    Rehab Potential Good   PT Treatment/Interventions Neuromuscular re-education   PT Next Visit Plan n/a- one time evaluation; home program provided.   Consulted and Agree with Plan of Care Patient      Patient will benefit from skilled therapeutic intervention in order to improve the following deficits and impairments:  Decreased balance  Visit Diagnosis: Other abnormalities of gait and mobility  Dizziness  and giddiness     Problem List Patient Active Problem List   Diagnosis Date Noted  . Vestibulitis of ear 09/04/2016  . Memory difficulties 08/31/2015  . Tinnitus aurium 08/31/2015  . Ataxia 08/19/2014  . Tinnitus of right ear 08/19/2014  . OSA on CPAP 07/03/2014  . Bruxism, sleep-related 04/13/2014  . Insomnia 04/13/2014  . Degenerative arthritis of hip 01/10/2013  . RHINOCONJUNCTIVITIS 04/21/2010  . HYPERLIPIDEMIA 05/20/2009  . Essential hypertension 05/20/2009  . ALLERGIC CONJUNCTIVITIS 05/20/2008  . Asthma with bronchitis 11/19/2007  . ESOPHAGEAL REFLUX 11/19/2007    Rhea Thrun, PT 09/11/2016, 8:37 PM  [facility] [address] [place], [place], [address] Phone: [phone]   Fax:  [phone]  Name: RYLEEANN RYERSON MRN: [id number] Date of Birth: 02/23/1946

## 2016-10-16 DIAGNOSIS — H35371 Puckering of macula, right eye: Secondary | ICD-10-CM | POA: Diagnosis not present

## 2016-10-16 DIAGNOSIS — Z961 Presence of intraocular lens: Secondary | ICD-10-CM | POA: Diagnosis not present

## 2016-10-16 DIAGNOSIS — H5213 Myopia, bilateral: Secondary | ICD-10-CM | POA: Diagnosis not present

## 2016-10-16 DIAGNOSIS — H35363 Drusen (degenerative) of macula, bilateral: Secondary | ICD-10-CM | POA: Diagnosis not present

## 2016-10-30 ENCOUNTER — Ambulatory Visit (INDEPENDENT_AMBULATORY_CARE_PROVIDER_SITE_OTHER): Payer: Medicare Other | Admitting: Internal Medicine

## 2016-10-30 ENCOUNTER — Encounter: Payer: Self-pay | Admitting: Internal Medicine

## 2016-10-30 DIAGNOSIS — J4520 Mild intermittent asthma, uncomplicated: Secondary | ICD-10-CM

## 2016-10-30 DIAGNOSIS — J00 Acute nasopharyngitis [common cold]: Secondary | ICD-10-CM

## 2016-10-30 MED ORDER — FLUTICASONE PROPIONATE 50 MCG/ACT NA SUSP: NASAL | 99 refills | Status: AC

## 2016-10-30 NOTE — Progress Notes (Signed)
HPI   F former smoker, followed for bronchitis, rhinitis, allergic conjunctivitis   10/25/15- 70 year old female former smoker followed for asthma with bronchitis, rhinitis, allergic conjunctivitis, complicated by OSA([facility]), GERD    pcp Dr Dagmar Hait FOLLOWS FOR: Pt has done well overall until last few days-pt states its been harder to breathe-slight chest tightness. Mild nonspecific chest tightness for the last few days but does not think she has a cold and has not noticed wheezing or cough. Using Advair intermittently, does not have a rescue inhaler now.[name] Respiratory vaccines are given by her primary physician  12/06/2015-70 year old female former smoker followed for asthma/bronchitis, rhinitis, allergic conjunctivitis, complicated by OSA ([facility]), GERD Acute Visit Follows For: Pt c/o headaches, sinus pressure, chest congestion, prod cough with green and brown mucus, wheezing and fatigue x5 days   10/30/2016-70 year old female former smoker followed for asthma/bronchitis, rhinitis, allergic conjunctivitis, complicated by OSA ([facility]), GERD CPAP managed by Dr. [email or web address]  who has assessed her for vestibular dysfunction FOLLOWS FOR:Pt states she has only had to use albuterol 3 times since last visit 11-2015. Denies any wheezing, SOB,or cough. Pt will need refill for Flonase. Asthma has been very well controlled. No longer using Advair maintenance inhaler. Uses rescue inhaler only with viral cold exacerbations once or twice a year. Feels well today. Asks refill Flonase for seasonal rhinitis.  ROS-see HPI Constitutional:   No-   weight loss, night sweats, fevers, chills, fatigue, lassitude. HEENT:   No-  headaches, difficulty swallowing, tooth/dental problems, sore throat,     No- sneezing, itching, ear ache,  +nasal congestion, post nasal drip,  CV:  No-   chest pain, orthopnea, PND, swelling in lower extremities, anasarca, dizziness, palpitations Resp: No-   shortness of breath with exertion or at rest.                 No- coughing up of blood, productive cough-none, +nonproductive cough            No-   change in color of mucus.  + Occasional wheezing.   Skin: No-   rash or lesions. GI:  No-   heartburn, indigestion, abdominal pain, nausea, vomiting,  GU:   Neuro-     nothing unusual Psych:  No- change in mood or affect. No depression or anxiety.  No memory loss.  OBJ General- Alert, Oriented, Affect-appropriate, Distress- none acute Skin- rash-none, lesions- none, excoriation- none Lymphadenopathy- none Head- atraumatic            Eyes- Gross vision intact, PERRLA, conjunctivae look pale, clear secretions            Ears- Hearing, canals-normal            Nose- , no-Septal dev, mucus, polyps, erosion, perforation             Throat- Mallampati III , mucosa clear , drainage- none, tonsils- atrophic Neck- flexible , trachea midline, no stridor , thyroid nl, carotid no bruit Chest - symmetrical excursion , unlabored           Heart/CV- RRR , no murmur , no gallop  , no rub, nl s1 s2                           - JVD- none , edema- none, stasis changes- none, varices- none           Lung- clear to P&A, wheeze- none, cough-none , dullness-none, rub- none  Chest wall-  Abd-  Br/ Gen/ Rectal- Not done, not indicated Extrem- cyanosis- none, clubbing, none, atrophy- none, strength- nl Neuro- grossly intact to observation. No nystagmus, and no carotid bruit.

## 2016-10-30 NOTE — Patient Instructions (Signed)
Refills script sent for [facility] to use your inhalers for intervals when needed  Please call if we can help

## 2016-10-31 NOTE — Assessment & Plan Note (Signed)
Only using a rescue inhaler occasionally now for which she self identifies as a viral triggered episodes of wheezing bronchitis once or twice a year. We discussed options. It is appropriate to be off of maintenance controller now.

## 2016-10-31 NOTE — Assessment & Plan Note (Signed)
Mostly seasonal allergy. There may now be a nonallergic/vasomotor component. She feels well controlled today. Plan-refill Flonase as discussed, antihistamine if needed

## 2016-11-14 ENCOUNTER — Other Ambulatory Visit: Payer: Self-pay | Admitting: Internal Medicine

## 2016-11-20 DIAGNOSIS — M25531 Pain in right wrist: Secondary | ICD-10-CM | POA: Diagnosis not present

## 2016-11-21 ENCOUNTER — Ambulatory Visit (INDEPENDENT_AMBULATORY_CARE_PROVIDER_SITE_OTHER): Payer: Medicare Other | Admitting: Cardiovascular Disease

## 2016-11-21 ENCOUNTER — Encounter: Payer: Self-pay | Admitting: Cardiovascular Disease

## 2016-11-21 VITALS — BP 150/78 | HR 66 | Ht 61.0 in | Wt 159.0 lb

## 2016-11-21 DIAGNOSIS — I10 Essential (primary) hypertension: Secondary | ICD-10-CM | POA: Diagnosis not present

## 2016-11-21 NOTE — Assessment & Plan Note (Signed)
History of hyperlipidemia on statin therapy followed by her PCP. 

## 2016-11-21 NOTE — Patient Instructions (Signed)
Medication Instructions: Your physician recommends that you continue on your current medications as directed. Please refer to the Current Medication list given to you today.  Labwork: I will request labs from Dr. Dagmar Hait.   Follow-Up: Your physician wants you to follow-up in: 1 year with Dr. Gwenlyn Found. You will receive a reminder letter in the mail two months in advance. If you don't receive a letter, please call our office to schedule the follow-up appointment.  If you need a refill on your cardiac medications before your next appointment, please call your pharmacy.

## 2016-11-21 NOTE — Progress Notes (Signed)
11/21/2016 Amber Roman   1946-08-20  [id number]  Primary Physician Tivis Ringer, MD Primary Cardiologist: Lorretta Harp MD [name]  HPI:  The patient is a 70 year old, fit-appearing, divorced Caucasian female, mother of one 39 year-old [place] adopted daughter named [name] who she adopted when she was 70 years old and has recently graduated from [employer]. She lives in [place].. I last saw her a year ago. She has a history of hypertension, hyperlipidemia, and a strong family history for heart disease with a father who had his first MI at age 36 and died 34 years later of a massive myocardial infarction. Her last Myoview stress test performed 10/25/12 was not ischemic.She has never had a heart attack or stroke, otherwise is asymptomatic. Her other problems include uterine cancer having undergone a total abdominal hysterectomy without recurrence 12 years ago. Dr. Dagmar Hait follows her lipid profile. She had an uncomplicated total hip replacement by Dr. Zollie Beckers 01/09/2013. Since I saw her back she denies chest pain or shortness of breath. She was diagnosed obstructive sleep apnea by Dr. Fernande Bras and has been a remarkable responder to CPAP.   Current Outpatient Prescriptions  Medication Sig Dispense Refill  . aspirin 81 MG tablet Take 81 mg by mouth daily.    . diphenoxylate-atropine (LOMOTIL) 2.5-0.025 MG per tablet Take 1 tablet by mouth 4 (four) times daily as needed. For stomach    . DULoxetine (CYMBALTA) 60 MG capsule Take 1 capsule by mouth daily.  1  . fluticasone (FLONASE) 50 MCG/ACT nasal spray 1-2 puffs each nostril once daily while needed 16 g prn  . Fluticasone-Salmeterol (ADVAIR) 100-50 MCG/DOSE AEPB Inhale 1 puff into the lungs 2 (two) times daily as needed. For shortness of breath 60 each 11  . guaiFENesin (MUCINEX) 600 MG 12 hr tablet Take 1,200 mg by mouth 2 (two) times daily as needed. For chest tightness    .  irbesartan-hydrochlorothiazide (AVALIDE) 300-12.5 MG tablet Take 1 tablet by mouth daily.    . meloxicam (MOBIC) 15 MG tablet 15 mg daily.  0  . olopatadine (PATANOL) 0.1 % ophthalmic solution Place 1 drop into both eyes 2 (two) times daily as needed. For allergies 5 mL 3  . OVER THE COUNTER MEDICATION Vitamin B6 100mg  daily in the am    . PROAIR HFA 108 (90 Base) MCG/ACT inhaler INHALE 2 PUFFS INTO THE LUNGS EVERY 6 HOURS AS NEEDED FOR WHEEZING OR SHORTNESS OF BREATH 8.5 g 0  . rosuvastatin (CRESTOR) 20 MG tablet Take 20 mg by mouth at bedtime.      No current facility-administered medications for this visit.     Allergies  Allergen Reactions  . Sulfonamide Derivatives Other (See Comments)    Childhood allergy     Social History   Social History  . Marital status: Single    Spouse name: N/A  . Number of children: 1  . Years of education: College   Occupational History  . Not on file.   Social History Main Topics  . Smoking status: Former Smoker    Packs/day: 2.00    Years: 15.00    Types: Cigarettes    Quit date: 12/18/1978  . Smokeless tobacco: Never Used  . Alcohol use No  . Drug use: No     Comment: hx of marijuana use   . Sexual activity: Not on file   Other Topics Concern  . Not on file   Social History Narrative  Patient is single.   Patient has a college education.   Patient drinks one caffeine drink per day.   Patient is right-handed.   Patient has one child.           Review of Systems: General: negative for chills, fever, night sweats or weight changes.  Cardiovascular: negative for chest pain, dyspnea on exertion, edema, orthopnea, palpitations, paroxysmal nocturnal dyspnea or shortness of breath Dermatological: negative for rash Respiratory: negative for cough or wheezing Urologic: negative for hematuria Abdominal: negative for nausea, vomiting, diarrhea, bright red blood per rectum, melena, or hematemesis Neurologic: negative for visual changes,  syncope, or dizziness All other systems reviewed and are otherwise negative except as noted above.    Blood pressure (!) 150/78, pulse 66, height 5\' 1"  (1.549 m), weight 159 lb (72.1 kg).  General appearance: alert and no distress Neck: no adenopathy, no carotid bruit, no JVD, supple, symmetrical, trachea midline and thyroid not enlarged, symmetric, no tenderness/mass/nodules Lungs: clear to auscultation bilaterally Heart: regular rate and rhythm, S1, S2 normal, no murmur, click, rub or gallop Extremities: extremities normal, atraumatic, no cyanosis or edema  EKG sinus rhythm at 66 with septal Q waves. I personally reviewed this EKG.  ASSESSMENT AND PLAN:   HYPERLIPIDEMIA History of hyperlipidemia on statin therapy followed by her PCP  Essential hypertension History of hypertension blood pressure measured 150/78. She is on Avalide. Continue current meds at current dosing      Lorretta Harp MD [facility], [facility] 11/21/2016 5:04 PM

## 2016-11-21 NOTE — Assessment & Plan Note (Signed)
History of hypertension blood pressure measured 150/78. She is on Avalide. Continue current meds at current dosing

## 2016-11-22 DIAGNOSIS — Z803 Family history of malignant neoplasm of breast: Secondary | ICD-10-CM | POA: Diagnosis not present

## 2016-11-22 DIAGNOSIS — Z1231 Encounter for screening mammogram for malignant neoplasm of breast: Secondary | ICD-10-CM | POA: Diagnosis not present

## 2016-11-22 LAB — HM MAMMOGRAPHY

## 2016-12-04 ENCOUNTER — Ambulatory Visit (INDEPENDENT_AMBULATORY_CARE_PROVIDER_SITE_OTHER): Payer: Medicare Other | Admitting: Adult Health

## 2016-12-04 ENCOUNTER — Encounter: Payer: Self-pay | Admitting: Adult Health

## 2016-12-04 VITALS — BP 113/66 | HR 59 | Ht 61.0 in | Wt 158.0 lb

## 2016-12-04 DIAGNOSIS — Z9989 Dependence on other enabling machines and devices: Secondary | ICD-10-CM

## 2016-12-04 DIAGNOSIS — G4733 Obstructive sleep apnea (adult) (pediatric): Secondary | ICD-10-CM | POA: Diagnosis not present

## 2016-12-04 DIAGNOSIS — R42 Dizziness and giddiness: Secondary | ICD-10-CM | POA: Diagnosis not present

## 2016-12-04 DIAGNOSIS — M67431 Ganglion, right wrist: Secondary | ICD-10-CM | POA: Diagnosis not present

## 2016-12-04 NOTE — Patient Instructions (Addendum)
Continue using CPAP If dizziness gets worse let us know. Do exercises taught to you at rehab for balance and dizziness. If your symptoms worsen or you develop new symptoms please let us know.

## 2016-12-04 NOTE — Progress Notes (Signed)
PATIENT: Amber Roman DOB: 1946-02-05  REASON FOR VISIT: follow up- obstructive sleep apnea, vertigo HISTORY FROM: patient  HISTORY OF PRESENT ILLNESS: Amber Roman is a 70 year old female with a history of obstructive sleep apnea on CPAP and vertigo. She returns today for a compliance download. Her download indicates that she uses her machine 29 out of 30 days for compliance of 97%. Each night she uses her machine greater than 4 hours. On average she uses her machine 8 hours and 44 minutes. Her residual AHI is 3.1 on 7cmH2O with EPR 2. She does not have a significant leak. The patient reports that she did go to one session of vestibular rehabilitation. According to the physical therapist notes the Dix-Hallpike maneuver was negative on both right and left. They did give her exercises for balance. The patient states that she has not been doing these at home. She states that she continues to have a feeling of being off balance. Denies any spinning episodes. She states that 3 to days ago she felt as if the floor was tilted and it only lasted about 30 seconds. She states that this balance and dizziness sensation has been going on for at least 5 years. She states that she also has constant ringing in the ears. This started approximately 6 months ago. She did have a MRI in 2015 for ear fullness and ringing in the ear that was normal. She denies nausea. Denies headache. She reports that she does have to be careful walking. She does have a history of neuropathy. She returns today for an evaluation.   Interval history from 09/04/2016 ([facility]'s notes): as the pleasure of seeing Amber Roman today who states that she has had spells of dizziness with rapid head movements, numbness and loss of sensory in the big toe of the right foot but no concerns related to sleep or memory. She does have tinnitus. The dizziness is clearly vertigo. She has seen an ear nose and throat specialist, Dr. Thornell Mule for hearing testing  and evaluation, but she has not had vestibular treatments. She announced that she will not tolerate a caloric test. Interval history for her CPAP the patient's 90% compliance 27th of days out of the last 30 she has used her CPAP average daytime use is 7 hours and 52 minutes, set pressure is 7 cm water with an EPR level of 2, residual AHI is 4.1. They will air leak is noted. Epworth sleepiness score endorsed at 1, fatigue severity at 35 points and geriatric depression score at one point patient also underwent a Montral cognitive assessment this 28 out of 30 points well in normal limits. She missed two uncued  points in delayed recall.  REVIEW OF SYSTEMS: Out of a complete 14 system review of symptoms, the patient complains only of the following symptoms, and all other reviewed systems are negative.  Ringing in ears, runny nose, trouble swelling, fatigue, shortness of breath, apnea, daytime sleepiness, heat intolerance, incontinence of bladder, back pain, itching, depression, weakness, numbness, dizziness, memory loss  ALLERGIES: Allergies  Allergen Reactions  . Sulfonamide Derivatives Other (See Comments)    Childhood allergy     HOME MEDICATIONS: Outpatient Medications Prior to Visit  Medication Sig Dispense Refill  . aspirin 81 MG tablet Take 81 mg by mouth daily.    . diphenoxylate-atropine (LOMOTIL) 2.5-0.025 MG per tablet Take 1 tablet by mouth 4 (four) times daily as needed. For stomach    . DULoxetine (CYMBALTA) 60 MG capsule Take 1 capsule  by mouth daily.  1  . fluticasone (FLONASE) 50 MCG/ACT nasal spray 1-2 puffs each nostril once daily while needed 16 g prn  . Fluticasone-Salmeterol (ADVAIR) 100-50 MCG/DOSE AEPB Inhale 1 puff into the lungs 2 (two) times daily as needed. For shortness of breath 60 each 11  . guaiFENesin (MUCINEX) 600 MG 12 hr tablet Take 1,200 mg by mouth 2 (two) times daily as needed. For chest tightness    . irbesartan-hydrochlorothiazide (AVALIDE) 300-12.5 MG  tablet Take 1 tablet by mouth daily.    . meloxicam (MOBIC) 15 MG tablet 15 mg daily.  0  . olopatadine (PATANOL) 0.1 % ophthalmic solution Place 1 drop into both eyes 2 (two) times daily as needed. For allergies 5 mL 3  . OVER THE COUNTER MEDICATION Vitamin B6 100mg  daily in the am    . PROAIR HFA 108 (90 Base) MCG/ACT inhaler INHALE 2 PUFFS INTO THE LUNGS EVERY 6 HOURS AS NEEDED FOR WHEEZING OR SHORTNESS OF BREATH 8.5 g 0  . rosuvastatin (CRESTOR) 20 MG tablet Take 20 mg by mouth at bedtime.      No facility-administered medications prior to visit.     PAST MEDICAL HISTORY: Past Medical History:  Diagnosis Date  . Allergic conjunctivitis   . Arthritis   . Bronchitis   . Bruxism, sleep-related 04/13/2014  . Depression   . Esophageal reflux   . Family history of heart disease   . H/O abdominal hysterectomy    w/o recurrence  . Hyperlipidemia   . Hypertension   . Insomnia 04/13/2014  . Obstructive sleep apnea    improved on C Pap  . Uterine cancer [facility])    hysterectomy    PAST SURGICAL HISTORY: Past Surgical History:  Procedure Laterality Date  . BILATERAL SALPINGOOPHORECTOMY    . CARPAL TUNNEL RELEASE     bilateral  . DOPPLER ECHOCARDIOGRAPHY    . NASAL SEPTUM SURGERY    . NM MYOVIEW LTD    . TOTAL ABDOMINAL HYSTERECTOMY    . TOTAL HIP ARTHROPLASTY  01/10/2013   Procedure: TOTAL HIP ARTHROPLASTY ANTERIOR APPROACH;  Surgeon: Mcarthur Rossetti, MD;  Location: [facility];  Service: Orthopedics;  Laterality: Right;    FAMILY HISTORY: Family History  Problem Relation Age of Onset  . Heart attack Father   . High blood pressure Father   . Multiple sclerosis Father   . Lung disease Mother   . Colon cancer Brother   . Colon cancer Brother   . High blood pressure Brother   . Diabetes Mellitus II Sister   . High blood pressure Sister   . Obesity Sister     SOCIAL HISTORY: Social History   Social History  . Marital status: Single    Spouse name: N/A  . Number of  children: 1  . Years of education: College   Occupational History  . Not on file.   Social History Main Topics  . Smoking status: Former Smoker    Packs/day: 2.00    Years: 15.00    Types: Cigarettes    Quit date: 12/18/1978  . Smokeless tobacco: Never Used  . Alcohol use No  . Drug use: No     Comment: hx of marijuana use   . Sexual activity: Not on file   Other Topics Concern  . Not on file   Social History Narrative   Patient is single.   Patient has a college education.   Patient drinks one caffeine drink per day.   Patient  is right-handed.   Patient has one child.            PHYSICAL EXAM  Vitals:   12/04/16 0806  BP: 113/66  Pulse: (!) 59  Weight: 158 lb (71.7 kg)  Height: 5\' 1"  (1.549 m)   Body mass index is 29.85 kg/m.  Generalized: Well developed, in no acute distress   Neurological examination  Mentation: Alert oriented to time, place, history taking. Follows all commands speech and language fluent Cranial nerve II-XII: Pupils were equal round reactive to light. Extraocular movements were full, visual field were full on confrontational test. Facial sensation and strength were normal. Uvula tongue midline. Head turning and shoulder shrug  were normal and symmetric. Motor: The motor testing reveals 5 over 5 strength of all 4 extremities. Good symmetric motor tone is noted throughout.  Sensory: Sensory testing is intact to soft touch on all 4 extremities. No evidence of extinction is noted.  Coordination: Cerebellar testing reveals good finger-nose-finger and heel-to-shin bilaterally.  Gait and station: Gait is normal. Tandem gait is slightly unsteady. Romberg is negative. No drift is seen.  Reflexes: Deep tendon reflexes are symmetric and normal bilaterally.   DIAGNOSTIC DATA (LABS, IMAGING, TESTING) - I reviewed patient records, labs, notes, testing and imaging myself where available.  Lab Results  Component Value Date   WBC 11.7 (H) 01/12/2013    HGB 10.0 (L) 01/12/2013   HCT 30.1 (L) 01/12/2013   MCV 84.1 01/12/2013   PLT 257 01/12/2013      Component Value Date/Time   NA 140 01/11/2013 0444   K 3.9 01/11/2013 0444   CL 103 01/11/2013 0444   CO2 28 01/11/2013 0444   GLUCOSE 102 (H) 01/11/2013 0444   BUN 11 01/11/2013 0444   CREATININE 0.52 01/11/2013 0444   CALCIUM 8.8 01/11/2013 0444   GFRNONAA >90 01/11/2013 0444   GFRAA >90 01/11/2013 0444      ASSESSMENT AND PLAN 70 y.o. year old female  has a past medical history of Allergic conjunctivitis; Arthritis; Bronchitis; Bruxism, sleep-related (04/13/2014); Depression; Esophageal reflux; Family history of heart disease; H/O abdominal hysterectomy; Hyperlipidemia; Hypertension; Insomnia (04/13/2014); Obstructive sleep apnea; and Uterine cancer ([place]). here with:  1. Obstructive sleep apnea on CPAP 2. Dizziness  The patient's CPAP download is excellent. She is encouraged to continue using the CPAP nightly. The patient feels that her dizziness has improved and is tolerable at this time. Her physical exam is unremarkable with the exception of difficulty with tandem gait. Patient advised that if her symptoms worsen or she develops any new symptoms she should let us know. She will follow-up in 6 months with Dr. Jannifer Franklin.     Ward Givens, MSN, NP-C 12/04/2016, 7:44 AM [facility] [address], [place], [place] [address] [phone]

## 2016-12-05 NOTE — Progress Notes (Signed)
I agree with the assessment and plan as directed by NP .The patient is known to me .   Brando Taves, MD  

## 2016-12-06 DIAGNOSIS — E119 Type 2 diabetes mellitus without complications: Secondary | ICD-10-CM | POA: Diagnosis not present

## 2016-12-08 ENCOUNTER — Other Ambulatory Visit: Payer: Self-pay

## 2016-12-08 DIAGNOSIS — M67431 Ganglion, right wrist: Secondary | ICD-10-CM | POA: Diagnosis not present

## 2016-12-13 DIAGNOSIS — M67431 Ganglion, right wrist: Secondary | ICD-10-CM | POA: Diagnosis not present

## 2017-03-22 DIAGNOSIS — M19041 Primary osteoarthritis, right hand: Secondary | ICD-10-CM | POA: Diagnosis not present

## 2017-03-22 DIAGNOSIS — M19042 Primary osteoarthritis, left hand: Secondary | ICD-10-CM | POA: Diagnosis not present

## 2017-03-26 ENCOUNTER — Ambulatory Visit (INDEPENDENT_AMBULATORY_CARE_PROVIDER_SITE_OTHER): Payer: Medicare Other | Admitting: Podiatry

## 2017-03-26 ENCOUNTER — Encounter: Payer: Self-pay | Admitting: Podiatry

## 2017-03-26 DIAGNOSIS — L600 Ingrowing nail: Secondary | ICD-10-CM

## 2017-03-26 DIAGNOSIS — B351 Tinea unguium: Secondary | ICD-10-CM | POA: Diagnosis not present

## 2017-03-26 NOTE — Progress Notes (Signed)
Subjective:     Patient ID: Amber Roman, female   DOB: 1945-12-20, 71 y.o.   MRN: [id number]  HPI patient presents with damage to the third nail plates bilateral with thick hallux nail plates bilateral that she was concerned about. States she cannot cut the third nails and they're becoming increasingly tender   Review of Systems  All other systems reviewed and are negative.      Objective:   Physical Exam  Constitutional: She is oriented to person, place, and time.  Cardiovascular: Intact distal pulses.   Musculoskeletal: Normal range of motion.  Neurological: She is oriented to person, place, and time.  Skin: Skin is warm.  Nursing note and vitals reviewed.  neurovascular status found to be intact muscle strength was adequate with patient noted to have thick incurvated third nailbeds bilateral that are painful when pressed with history of trying to cut them and bleeding. Hallux nails bilateral thick but she's able to handle them herself     Assessment:     Damage third nail plates bilateral with pain incurvation and inability to cut without bleeding and mycotic component hallux bilateral    Plan:     H&P conditions reviewed and I've recommended removal of the third nails bilateral. I explained procedure and risk and patient wants procedures done and today I infiltrated each third digit 60 mg like Marcaine mixture and remove the third nail exposed matrix and applied phenol for applications 30 seconds followed by alcohol lavaged sterile dressing. Debris did hallux nails which can be done as patient needs

## 2017-03-26 NOTE — Patient Instructions (Signed)

## 2017-04-09 DIAGNOSIS — G4733 Obstructive sleep apnea (adult) (pediatric): Secondary | ICD-10-CM | POA: Diagnosis not present

## 2017-04-09 DIAGNOSIS — I10 Essential (primary) hypertension: Secondary | ICD-10-CM | POA: Diagnosis not present

## 2017-04-09 DIAGNOSIS — G629 Polyneuropathy, unspecified: Secondary | ICD-10-CM | POA: Diagnosis not present

## 2017-04-09 DIAGNOSIS — J45909 Unspecified asthma, uncomplicated: Secondary | ICD-10-CM | POA: Diagnosis not present

## 2017-04-09 DIAGNOSIS — Z6828 Body mass index (BMI) 28.0-28.9, adult: Secondary | ICD-10-CM | POA: Diagnosis not present

## 2017-04-09 DIAGNOSIS — R0609 Other forms of dyspnea: Secondary | ICD-10-CM | POA: Diagnosis not present

## 2017-04-09 DIAGNOSIS — M1990 Unspecified osteoarthritis, unspecified site: Secondary | ICD-10-CM | POA: Diagnosis not present

## 2017-04-09 DIAGNOSIS — E119 Type 2 diabetes mellitus without complications: Secondary | ICD-10-CM | POA: Diagnosis not present

## 2017-05-21 ENCOUNTER — Encounter: Payer: Self-pay | Admitting: Acute Care

## 2017-05-21 ENCOUNTER — Ambulatory Visit (INDEPENDENT_AMBULATORY_CARE_PROVIDER_SITE_OTHER): Payer: Medicare Other | Admitting: Acute Care

## 2017-05-21 VITALS — BP 142/68 | HR 77 | Temp 98.1°F | Ht 61.0 in | Wt 158.6 lb

## 2017-05-21 DIAGNOSIS — J4520 Mild intermittent asthma, uncomplicated: Secondary | ICD-10-CM

## 2017-05-21 DIAGNOSIS — R0602 Shortness of breath: Secondary | ICD-10-CM | POA: Diagnosis not present

## 2017-05-21 MED ORDER — AZITHROMYCIN 250 MG PO TABS: ORAL_TABLET | ORAL | 5 refills | Status: DC

## 2017-05-21 MED ORDER — LEVALBUTEROL HCL 0.63 MG/3ML IN NEBU
0.6300 mg | INHALATION_SOLUTION | Freq: Once | RESPIRATORY_TRACT | Status: AC
  Administered 2017-05-21: 0.63 mg via RESPIRATORY_TRACT

## 2017-05-21 NOTE — Assessment & Plan Note (Addendum)
Mild Flare Plan: Xopenex neb today in office Paper RX for Zpack to use if needed. Mucinex 1200 mg once daily  to help with congestion. Over the counter decongestant if needed for continued congestion Continue Flonase. Continue your Breo once daily without fail. Rinse mouth after use. Use your ProAir inhaler for breakthrough shortness of breath.( We will renew your prescription) Follow up with Dr. Annamaria Boots 11/05/2017 as is scheduled. Please contact office for sooner follow up if symptoms do not improve or worsen or seek emergency care

## 2017-05-21 NOTE — Progress Notes (Signed)
History of Present Illness Amber Roman is a 71 y.o. female with asthma and rhinitis, allergic conjunctivitis, complicated by OSA(GNA), GERD. She is followed by Dr. Annamaria Boots.   05/21/2017 Acute OV for bronchitis. Pt. States she went out of town to [place] for a wedding and has had a flare of her bronchitis. She is not coughing up any secretions, but has a cough.She was using her Mucinex but has not seemed to improve.She states her nose is stuffy and her ears are congested. She has been using her flonase daily. She was started on Breo by her PCP. She states it works well for her. She denies wheezing, but is requesting a neb treatmnt as they seem to work for her. She states she has not had fever, chest pain, orthopnea or hemoptysis. Test Results:  CBC Latest Ref Rng & Units 01/12/2013 01/11/2013 12/31/2012  WBC 4.0 - 10.5 K/uL 11.7(H) 15.2(H) 8.2  Hemoglobin 12.0 - 15.0 g/dL 10.0(L) 10.7(L) 13.0  Hematocrit 36.0 - 46.0 % 30.1(L) 32.1(L) 39.1  Platelets 150 - 400 K/uL 257 302 353    BMP Latest Ref Rng & Units 01/11/2013 12/31/2012  Glucose 70 - 99 mg/dL 102(H) 113(H)  BUN 6 - 23 mg/dL 11 13  Creatinine 0.50 - 1.10 mg/dL 0.52 0.62  Sodium 135 - 145 mEq/L 140 141  Potassium 3.5 - 5.1 mEq/L 3.9 4.1  Chloride 96 - 112 mEq/L 103 105  CO2 19 - 32 mEq/L 28 28  Calcium 8.4 - 10.5 mg/dL 8.8 9.7     Past medical hx Past Medical History:  Diagnosis Date  . Allergic conjunctivitis   . Arthritis   . Bronchitis   . Bruxism, sleep-related 04/13/2014  . Depression   . Esophageal reflux   . Family history of heart disease   . H/O abdominal hysterectomy    w/o recurrence  . Hyperlipidemia   . Hypertension   . Insomnia 04/13/2014  . Obstructive sleep apnea    improved on C Pap  . Uterine cancer [facility])    hysterectomy     Social History  Substance Use Topics  . Smoking status: Former Smoker    Packs/day: 2.00    Years: 15.00    Types: Cigarettes    Quit date: 12/18/1978  . Smokeless  tobacco: Never Used  . Alcohol use No    Tobacco Cessation:Former smoker quit 1980   Past surgical hx, Family hx, Social hx all reviewed.  Current Outpatient Prescriptions on File Prior to Visit  Medication Sig  . aspirin 81 MG tablet Take 81 mg by mouth daily.  . diphenoxylate-atropine (LOMOTIL) 2.5-0.025 MG per tablet Take 1 tablet by mouth 4 (four) times daily as needed. For stomach  . DULoxetine (CYMBALTA) 60 MG capsule Take 1 capsule by mouth daily.  . fluticasone (FLONASE) 50 MCG/ACT nasal spray 1-2 puffs each nostril once daily while needed  . gabapentin (NEURONTIN) 300 MG capsule at bedtime.   [name] guaiFENesin (MUCINEX) 600 MG 12 hr tablet Take 1,200 mg by mouth 2 (two) times daily as needed. For chest tightness  . irbesartan-hydrochlorothiazide (AVALIDE) 300-12.5 MG tablet Take 1 tablet by mouth daily.  . meloxicam (MOBIC) 15 MG tablet 15 mg daily.  [name] olopatadine (PATANOL) 0.1 % ophthalmic solution Place 1 drop into both eyes 2 (two) times daily as needed. For allergies  . OVER THE COUNTER MEDICATION Vitamin B6 100mg  daily in the am  . PROAIR HFA 108 (90 Base) MCG/ACT inhaler INHALE 2 PUFFS INTO THE LUNGS EVERY  6 HOURS AS NEEDED FOR WHEEZING OR SHORTNESS OF BREATH  . rosuvastatin (CRESTOR) 20 MG tablet Take 20 mg by mouth at bedtime.   . Fluticasone-Salmeterol (ADVAIR) 100-50 MCG/DOSE AEPB Inhale 1 puff into the lungs 2 (two) times daily as needed. For shortness of breath (Patient not taking: Reported on 05/21/2017)   No current facility-administered medications on file prior to visit.      Allergies  Allergen Reactions  . Sulfonamide Derivatives Other (See Comments)    Childhood allergy     Review Of Systems:  Constitutional:   No  weight loss, night sweats,  Fevers, chills, + fatigue, or  lassitude.  HEENT:   No headaches,  Difficulty swallowing,  Tooth/dental problems, or  Sore throat,                No sneezing, itching, ear ache,+ nasal congestion,+ post nasal drip,    CV:  No chest pain,  Orthopnea, PND, swelling in lower extremities, anasarca, dizziness, palpitations, syncope.   GI  No heartburn, indigestion, abdominal pain, nausea, vomiting, diarrhea, change in bowel habits, loss of appetite, bloody stools.   Resp: + shortness of breath with exertion none  at rest.  No excess mucus, no productive cough,  + non-productive cough,  No coughing up of blood.  No change in color of mucus.  No wheezing.  No chest wall deformity  Skin: no rash or lesions.  GU: no dysuria, change in color of urine, no urgency or frequency.  No flank pain, no hematuria   MS:  No joint pain or swelling.  No decreased range of motion.  No back pain.  Psych:  No change in mood or affect. No depression or anxiety.  No memory loss.   Vital Signs BP (!) 142/68 (BP Location: Left Arm, Patient Position: Sitting, Cuff Size: Normal)   Pulse 77   Temp 98.1 F (36.7 C)   Ht 5\' 1"  (1.549 m)   Wt 158 lb 9.6 oz (71.9 kg)   SpO2 96%   BMI 29.97 kg/m    Physical Exam:  General- No distress,  A&Ox3, pleasant ENT: No sinus tenderness, TM clear, edematous nasal mucosa, no oral exudate,+ post nasal drip, no LAN Cardiac: S1, S2, regular rate and rhythm, no murmur Chest: No wheeze/ rales/ dullness; no accessory muscle use, no nasal flaring, no sternal retractions Abd.: Soft Non-tender, obese Ext: No clubbing cyanosis, edema Neuro:  normal strength Skin: No rashes, warm and dry Psych: normal mood and behavior   Assessment/Plan  Allergic asthma, mild intermittent, uncomplicated Mild Flare Plan: Xopenex neb today in office Paper RX for Zpack to use if needed. Mucinex 1200 mg once daily  to help with congestion. Over the counter decongestant if needed for continued congestion Continue Flonase. Continue your Breo once daily without fail. Rinse mouth after use. Use your ProAir inhaler for breakthrough shortness of breath.( We will renew your prescription) Follow up with Dr.  Annamaria Boots 11/05/2017 as is scheduled. Please contact office for sooner follow up if symptoms do not improve or worsen or seek emergency care      Magdalen Spatz, NP 05/21/2017  4:55 PM

## 2017-05-21 NOTE — Patient Instructions (Addendum)
It is good to see you today. Paper RX for Zpack to use if needed. Mucinex 1200 mg once daily  to help with congestion. Over the counter decongestant if needed for continued congestion Continue Flonase. Continue your Breo once daily without fail. Rinse mouth after use. Use your ProAir inhaler for breakthrough shortness of breath.( We will renew your prescription) Follow up with Dr. Annamaria Boots 11/05/2017 as is scheduled. Please contact office for sooner follow up if symptoms do not improve or worsen or seek emergency care

## 2017-06-04 ENCOUNTER — Ambulatory Visit: Payer: Medicare Other | Admitting: Neurology

## 2017-06-14 ENCOUNTER — Ambulatory Visit: Payer: Medicare Other | Admitting: Neurology

## 2017-07-05 DIAGNOSIS — M25511 Pain in right shoulder: Secondary | ICD-10-CM | POA: Diagnosis not present

## 2017-08-08 ENCOUNTER — Encounter: Payer: Self-pay | Admitting: Internal Medicine

## 2017-08-08 ENCOUNTER — Ambulatory Visit (INDEPENDENT_AMBULATORY_CARE_PROVIDER_SITE_OTHER): Payer: Medicare Other | Admitting: Internal Medicine

## 2017-08-08 VITALS — BP 132/70 | HR 70 | Ht 61.5 in | Wt 159.2 lb

## 2017-08-08 DIAGNOSIS — J4541 Moderate persistent asthma with (acute) exacerbation: Secondary | ICD-10-CM | POA: Insufficient documentation

## 2017-08-08 DIAGNOSIS — Z87891 Personal history of nicotine dependence: Secondary | ICD-10-CM | POA: Insufficient documentation

## 2017-08-08 MED ORDER — PREDNISONE 10 MG PO TABS: ORAL_TABLET | ORAL | 0 refills | Status: DC

## 2017-08-08 MED ORDER — CEPHALEXIN 500 MG PO CAPS: 500.0000 mg | ORAL_CAPSULE | Freq: Three times a day (TID) | ORAL | 0 refills | Status: DC

## 2017-08-08 NOTE — Addendum Note (Signed)
Addended by: Lorretta Harp on: 08/08/2017 10:52 AM   Modules accepted: Orders

## 2017-08-08 NOTE — Progress Notes (Signed)
Subjective:     Patient ID: Amber Roman, female   DOB: August 29, 1946, 71 y.o.   MRN: [id number]  HPI     OV 08/08/2017  Chief Complaint  Patient presents with  . Acute Visit    pt c/o chest tightness since vacation x 8 days. Pt traveled to [place]. Pt having cough, wheezing and SOB. Denies mucus production. Some yellow mucus from sinuses. Pt flew and feels like she got sick from the plane ride and the climate change.     71 year old female with a remote smoking history having quit 38 years ago but a 30 pack smoking history. [place] his sister in [place]. She says that it comes to [facility] to get sick because of the humidity. She sees Dr. Annamaria Boots during her visits for a diagnosis of asthma. Last imaging chest x-ray 2015 was clear. She is maintained on daily Brio. Most recently she went to [place] but then after coming back and coming down to [place] with the heat and humidity in [place] she feels that since the end of first week of August 2018 she's been sick with increased cough wheezing and yellow sputum and chest tightness but without any fever or hemoptysis or chest pain or orthopnea or paroxysmal nocturnal dyspnea. Symptoms are moderate and 10. She's not taking any antibiotics or prednisone for this yet.    has a past medical history of Allergic conjunctivitis; Arthritis; Bronchitis; Bruxism, sleep-related (04/13/2014); Depression; Esophageal reflux; Family history of heart disease; H/O abdominal hysterectomy; Hyperlipidemia; Hypertension; Insomnia (04/13/2014); Obstructive sleep apnea; and Uterine cancer ([place]).   reports that she quit smoking about 38 years ago. Her smoking use included Cigarettes. She has a 30.00 pack-year smoking history. She has never used smokeless tobacco.  Past Surgical History:  Procedure Laterality Date  . BILATERAL SALPINGOOPHORECTOMY    . CARPAL TUNNEL RELEASE     bilateral  . DOPPLER ECHOCARDIOGRAPHY    . NASAL SEPTUM SURGERY     . NM MYOVIEW LTD    . TOTAL ABDOMINAL HYSTERECTOMY    . TOTAL HIP ARTHROPLASTY  01/10/2013   Procedure: TOTAL HIP ARTHROPLASTY ANTERIOR APPROACH;  Surgeon: Mcarthur Rossetti, MD;  Location: [facility];  Service: Orthopedics;  Laterality: Right;    Allergies  Allergen Reactions  . Sulfonamide Derivatives Other (See Comments)    Childhood allergy     Immunization History  Administered Date(s) Administered  . DT 06/16/2013  . Influenza Split 08/19/2011, 09/17/2013, 11/16/2014, 09/15/2015, 09/14/2016  . Influenza Whole 09/26/2006, 09/17/2009, 10/06/2010, 09/16/2012  . Pneumococcal Polysaccharide-23 09/17/2009    Family History  Problem Relation Age of Onset  . Heart attack Father   . High blood pressure Father   . Multiple sclerosis Father   . Lung disease Mother   . Colon cancer Brother   . Colon cancer Brother   . High blood pressure Brother   . Diabetes Mellitus II Sister   . High blood pressure Sister   . Obesity Sister      Current Outpatient Prescriptions:  .  aspirin 81 MG tablet, Take 81 mg by mouth daily., Disp: , Rfl:  .  diphenoxylate-atropine (LOMOTIL) 2.5-0.025 MG per tablet, Take 1 tablet by mouth 4 (four) times daily as needed. For stomach, Disp: , Rfl:  .  DULoxetine (CYMBALTA) 60 MG capsule, Take 1 capsule by mouth daily., Disp: , Rfl: 1 .  fluticasone (FLONASE) 50 MCG/ACT nasal spray, 1-2 puffs each nostril once daily while needed, Disp: 16 g,  Rfl: prn .  fluticasone furoate-vilanterol (BREO ELLIPTA) 200-25 MCG/INH AEPB, Inhale 1 puff into the lungs daily., Disp: , Rfl:  .  gabapentin (NEURONTIN) 300 MG capsule, at bedtime. , Disp: , Rfl: 0 .  irbesartan-hydrochlorothiazide (AVALIDE) 300-12.5 MG tablet, Take 1 tablet by mouth daily., Disp: , Rfl:  .  meloxicam (MOBIC) 15 MG tablet, 15 mg daily., Disp: , Rfl: 0 .  olopatadine (PATANOL) 0.1 % ophthalmic solution, Place 1 drop into both eyes 2 (two) times daily as needed. For allergies, Disp: 5 mL, Rfl: 3 .   OVER THE COUNTER MEDICATION, Vitamin B6 100mg  daily in the am, Disp: , Rfl:  .  PROAIR HFA 108 (90 Base) MCG/ACT inhaler, INHALE 2 PUFFS INTO THE LUNGS EVERY 6 HOURS AS NEEDED FOR WHEEZING OR SHORTNESS OF BREATH, Disp: 8.5 g, Rfl: 0 .  rosuvastatin (CRESTOR) 20 MG tablet, Take 20 mg by mouth at bedtime. , Disp: , Rfl:  .  guaiFENesin (MUCINEX) 600 MG 12 hr tablet, Take 1,200 mg by mouth 2 (two) times daily as needed. For chest tightness, Disp: , Rfl:     Review of Systems     Objective:   Physical Exam  Constitutional: She is oriented to person, place, and time. She appears well-developed and well-nourished. No distress.  HENT:  Head: Normocephalic and atraumatic.  Right Ear: External ear normal.  Left Ear: External ear normal.  Mouth/Throat: Oropharynx is clear and moist. No oropharyngeal exudate.  Eyes: Pupils are equal, round, and reactive to light. Conjunctivae and EOM are normal. Right eye exhibits no discharge. Left eye exhibits no discharge. No scleral icterus.  Neck: Normal range of motion. Neck supple. No JVD present. No tracheal deviation present. No thyromegaly present.  Cardiovascular: Normal rate, regular rhythm, normal heart sounds and intact distal pulses.  Exam reveals no gallop and no friction rub.   No murmur heard. Pulmonary/Chest: Effort normal. No respiratory distress. She has wheezes. She has no rales. She exhibits no tenderness.  Abdominal: Soft. Bowel sounds are normal. She exhibits no distension and no mass. There is no tenderness. There is no rebound and no guarding.  Musculoskeletal: Normal range of motion. She exhibits no edema or tenderness.  Lymphadenopathy:    She has no cervical adenopathy.  Neurological: She is alert and oriented to person, place, and time. She has normal reflexes. No cranial nerve deficit. She exhibits normal muscle tone. Coordination normal.  Skin: Skin is warm and dry. No rash noted. She is not diaphoretic. No erythema. No pallor.   Psychiatric: She has a normal mood and affect. Her behavior is normal. Judgment and thought content normal.  Vitals reviewed.  Vitals:   08/08/17 1019  BP: 132/70  Pulse: 70  SpO2: 95%  Weight: 159 lb 3.2 oz (72.2 kg)  Height: 5' 1.5" (1.562 m)    Estimated body mass index is 29.59 kg/m as calculated from the following:   Height as of this encounter: 5' 1.5" (1.562 m).   Weight as of this encounter: 159 lb 3.2 oz (72.2 kg).     Assessment:       ICD-10-CM   1. Moderate persistent asthma with exacerbation J45.41   2. Stopped smoking with greater than 30 pack year history Z87.891    New problem 08/08/2017     Plan:      Please start and take cephalexin 500mg  three times daily x  5 days Please take prednisone 40 mg x1 day, then 30 mg x1 day, then 20 mg  x1 day, then 10 mg x1 day, and then 5 mg x1 day and stop Continue breo daily as before scheduled + albuterol as needed High dose flu shot in a few weeks when you are better Please talk to PCP Avva, Ravisankar, MD -  and ensure you get  shingarix vaccine  Followup As scheduled by Dr Annamaria Boots before but prior to visit do Pre-bd spiro and dlco only. No lung volume or bd response. No post-bd spiro  Return or call sooner or go to ER If not better/worse     Dr. Brand Males, M.D., [facility].C.P Pulmonary and Critical Care Medicine Staff Physician [facility] Pager: [phone], If no answer or between  15:00h - 7:00h: call [phone]  08/08/2017 10:41 AM

## 2017-08-08 NOTE — Patient Instructions (Addendum)
ICD-10-CM   1. Moderate persistent asthma with exacerbation J45.41   2. Stopped smoking with greater than 30 pack year history Z87.891      Please start and take cephalexin 500mg  three times daily x  5 days Please take prednisone 40 mg x1 day, then 30 mg x1 day, then 20 mg x1 day, then 10 mg x1 day, and then 5 mg x1 day and stop Continue breo daily as before scheduled + albuterol as needed High dose flu shot in a few weeks when you are better Please talk to PCP Avva, Ravisankar, MD -  and ensure you get  shingarix vaccine  Followup As scheduled by Dr Annamaria Boots before but prior to visit do Pre-bd spiro and dlco only. No lung volume or bd response. No post-bd spiro  Return or call sooner or go to ER If not better/worse

## 2017-08-30 DIAGNOSIS — M75101 Unspecified rotator cuff tear or rupture of right shoulder, not specified as traumatic: Secondary | ICD-10-CM | POA: Diagnosis not present

## 2017-09-03 ENCOUNTER — Other Ambulatory Visit (INDEPENDENT_AMBULATORY_CARE_PROVIDER_SITE_OTHER): Payer: Medicare Other

## 2017-09-03 ENCOUNTER — Encounter: Payer: Self-pay | Admitting: Internal Medicine

## 2017-09-03 ENCOUNTER — Ambulatory Visit (INDEPENDENT_AMBULATORY_CARE_PROVIDER_SITE_OTHER)
Admission: RE | Admit: 2017-09-03 | Discharge: 2017-09-03 | Disposition: A | Discharge status: Home / Self Care | Payer: Medicare Other | Source: Ambulatory Visit | Attending: Internal Medicine | Admitting: Internal Medicine

## 2017-09-03 ENCOUNTER — Ambulatory Visit (INDEPENDENT_AMBULATORY_CARE_PROVIDER_SITE_OTHER): Payer: Medicare Other | Admitting: Internal Medicine

## 2017-09-03 VITALS — BP 114/78 | HR 63 | Ht 61.5 in | Wt 161.2 lb

## 2017-09-03 DIAGNOSIS — Z9989 Dependence on other enabling machines and devices: Secondary | ICD-10-CM | POA: Diagnosis not present

## 2017-09-03 DIAGNOSIS — J4541 Moderate persistent asthma with (acute) exacerbation: Secondary | ICD-10-CM

## 2017-09-03 DIAGNOSIS — R05 Cough: Secondary | ICD-10-CM | POA: Diagnosis not present

## 2017-09-03 DIAGNOSIS — R079 Chest pain, unspecified: Secondary | ICD-10-CM | POA: Diagnosis not present

## 2017-09-03 DIAGNOSIS — G4733 Obstructive sleep apnea (adult) (pediatric): Secondary | ICD-10-CM

## 2017-09-03 LAB — CBC WITH DIFFERENTIAL/PLATELET
BASOS PCT: 1.2 % (ref 0.0–3.0)
Basophils Absolute: 0.1 10*3/uL (ref 0.0–0.1)
EOS ABS: 0.2 10*3/uL (ref 0.0–0.7)
Eosinophils Relative: 1.6 % (ref 0.0–5.0)
HCT: 40.9 % (ref 36.0–46.0)
HEMOGLOBIN: 13.6 g/dL (ref 12.0–15.0)
Lymphocytes Relative: 19.6 % (ref 12.0–46.0)
Lymphs Abs: 2.1 10*3/uL (ref 0.7–4.0)
MCHC: 33.3 g/dL (ref 30.0–36.0)
MCV: 88 fl (ref 78.0–100.0)
MONO ABS: 1.2 10*3/uL — AB (ref 0.1–1.0)
Monocytes Relative: 11 % (ref 3.0–12.0)
Neutro Abs: 7 10*3/uL (ref 1.4–7.7)
Neutrophils Relative %: 66.6 % (ref 43.0–77.0)
Platelets: 366 10*3/uL (ref 150.0–400.0)
RBC: 4.65 Mil/uL (ref 3.87–5.11)
RDW: 15 % (ref 11.5–15.5)
WBC: 10.6 10*3/uL — AB (ref 4.0–10.5)

## 2017-09-03 MED ORDER — UMECLIDINIUM-VILANTEROL 62.5-25 MCG/INH IN AEPB: 1.0000 | INHALATION_SPRAY | Freq: Every day | RESPIRATORY_TRACT | 0 refills | Status: DC

## 2017-09-03 MED ORDER — METHYLPREDNISOLONE ACETATE 80 MG/ML IJ SUSP
80.0000 mg | Freq: Once | INTRAMUSCULAR | Status: AC
  Administered 2017-09-03: 80 mg via INTRAMUSCULAR

## 2017-09-03 MED ORDER — LEVALBUTEROL HCL 0.63 MG/3ML IN NEBU
0.6300 mg | INHALATION_SOLUTION | Freq: Once | RESPIRATORY_TRACT | Status: AC
  Administered 2017-09-03: 0.63 mg via RESPIRATORY_TRACT

## 2017-09-03 NOTE — Patient Instructions (Signed)
Order- neb xop 0.63     Dx exacerbation of asthmatic bronchitis               Depo 34  Order-   CXR                        Reschedule full PFT               Lab- CBC w diff, Allergy profile       Sample Anoro Ellipta    Inhale 1 puff, then rinse mouth, once daily      Try this instead of Breo as a daily maintenance inhaler. When it runs out, return to [facility] for comparison.  Ok to use the red ProAir albuterol rescue inhaler    Inhale 2 puffs, every 6 hours, if needed, for chest tightness, wheeze, cough, shortness of breath

## 2017-09-03 NOTE — Progress Notes (Signed)
HPI  female former smoker followed for asthma/bronchitis, rhinitis, allergic conjunctivitis, complicated by OSA ([facility]/ Dr Brett Fairy), GERD  ---------------------------------------------------------------------------  10/30/2016-71 year old female former smoker followed for asthma/bronchitis, rhinitis, allergic conjunctivitis, complicated by OSA ([facility]), GERD CPAP managed by Dr. [email or web address]  who has assessed her for vestibular dysfunction FOLLOWS FOR:Pt states she has only had to use albuterol 3 times since last visit 11-2015. Denies any wheezing, SOB,or cough. Pt will need refill for Flonase. Asthma has been very well controlled. No longer using Advair maintenance inhaler. Uses rescue inhaler only with viral cold exacerbations once or twice a year. Feels well today. Asks refill Flonase for seasonal rhinitis.  09/03/17- 71 year old female former smoker followed for asthma/bronchitis, rhinitis, allergic conjunctivitis, complicated by OSA ([facility]), GERD ACUTE VISIT: Chest tightness, cough-productive-yellow in color, fatigued, runny nose. Denies any fever, SOB, or wheeze. Seen here in June and August for bronchitis. PFT ordered after the office visit but not done Breo 200, ProAir says she never uses rescue inhaler but does use Breo. Feeling tight in chest with scant yellow sputum. Prednisone taper and August did help. She thinks triggers [place] environment-air, humidity, heat and says she feels better when away. We discussed home environment.  ROS-see HPI + = positive Constitutional:   No-   weight loss, night sweats, fevers, chills, fatigue, lassitude. HEENT:   No-  headaches, difficulty swallowing, tooth/dental problems, sore throat,     No- sneezing, itching, ear ache,  +nasal congestion, post nasal drip,  CV:  No-   chest pain, orthopnea, PND, swelling in lower extremities, anasarca, dizziness, palpitations Resp: No-   shortness of breath with exertion or at rest.                No- coughing up  of blood, + productive cough-none, +nonproductive cough            +  change in color of mucus.  + Occasional wheezing.   Skin: No-   rash or lesions. GI:  No-   heartburn, indigestion, abdominal pain, nausea, vomiting,  GU:   Neuro-     nothing unusual Psych:  No- change in mood or affect. No depression or anxiety.  No memory loss.  OBJ General- Alert, Oriented, Affect-appropriate, Distress- none acute Skin- rash-none, lesions- none, excoriation- none Lymphadenopathy- none Head- atraumatic            Eyes- Gross vision intact, PERRLA, conjunctivae look pale, clear secretions            Ears- Hearing, + hair in left canal            Nose- , no-Septal dev, mucus, polyps, erosion, perforation             Throat- Mallampati III , mucosa clear , drainage- none, tonsils- atrophic Neck- flexible , trachea midline, no stridor , thyroid nl, carotid no bruit Chest - symmetrical excursion , unlabored           Heart/CV- RRR , no murmur , no gallop  , no rub, nl s1 s2                           - JVD- none , edema- none, stasis changes- none, varices- none           Lung- clear to P&A, wheeze- none, cough + dry , dullness-none, rub- none           Chest wall-  Abd-  Br/ Gen/ Rectal- Not done, not  indicated Extrem- cyanosis- none, clubbing, none, atrophy- none, strength- nl Neuro- grossly intact to observation. No nystagmus, and no carotid bruit.

## 2017-09-04 LAB — RESPIRATORY ALLERGY PROFILE REGION II ~~LOC~~
Allergen, Cedar tree, t12: <0.1 kU/L
Allergen, D pternoyssinus,d7: <0.1 kU/L
Allergen, Mulberry, t76: <0.1 kU/L
Allergen, P. notatum, m1: <0.1 kU/L
Aspergillus fumigatus, m3: <0.1 kU/L
CLADOSPORIUM HERBARUM (M2) IGE: <0.1 kU/L
CLASS: 0
CLASS: 0
CLASS: 0
CLASS: 0
CLASS: 0
COMMON RAGWEED (SHORT) (W1) IGE: <0.1 kU/L
Cat Dander: <0.1 kU/L
Class: 0
Class: 0
Class: 0
Class: 0
Class: 0
Class: 0
Class: 0
Class: 0
Class: 0
Class: 0
Class: 0
Class: 0
Class: 0
Class: 0
Class: 0
Class: 0
Class: 0
Class: 0
Class: 0
Cockroach: <0.1 kU/L
D. farinae: <0.1 kU/L
IGE (IMMUNOGLOBULIN E), SERUM: 45 kU/L (ref <=114)
Pecan/Hickory Tree IgE: <0.1 kU/L
Rough Pigweed  IgE: <0.1 kU/L

## 2017-09-04 LAB — INTERPRETATION:

## 2017-09-10 ENCOUNTER — Telehealth: Payer: Self-pay | Admitting: Internal Medicine

## 2017-09-10 MED ORDER — PREDNISONE 10 MG PO TABS: ORAL_TABLET | ORAL | 0 refills | Status: DC

## 2017-09-10 NOTE — Telephone Encounter (Signed)
Offer prednisone 10 mg, # 20, 4 X 2 DAYS, 3 X 2 DAYS, 2 X 2 DAYS, 1 X 2 DAYS  

## 2017-09-10 NOTE — Telephone Encounter (Signed)
Spoke with pt.  She c/o increased wheezing for past 4 days.  She received neb and depo at ov on 09/03/17 but this didn't help much this time.  She is still using Anoro sample that was given at the office.  Pt states she hasnt really used Proair much. Pt still c/o chest congestion, prod cough (yellow,brown).  Denies fever or chest tightness.  Pt has appt for PFT 09/17/17.  Please advise  Allergies  Allergen Reactions  . Sulfonamide Derivatives Other (See Comments)    Childhood allergy     Current Outpatient Prescriptions on File Prior to Visit  Medication Sig Dispense Refill  . aspirin 81 MG tablet Take 81 mg by mouth daily.    . diphenoxylate-atropine (LOMOTIL) 2.5-0.025 MG per tablet Take 1 tablet by mouth 4 (four) times daily as needed. For stomach    . DULoxetine (CYMBALTA) 60 MG capsule Take 1 capsule by mouth daily.  1  . fluticasone (FLONASE) 50 MCG/ACT nasal spray 1-2 puffs each nostril once daily while needed 16 g prn  . fluticasone furoate-vilanterol (BREO ELLIPTA) 200-25 MCG/INH AEPB Inhale 1 puff into the lungs daily.    [name] gabapentin (NEURONTIN) 300 MG capsule at bedtime.   0  . guaiFENesin (MUCINEX) 600 MG 12 hr tablet Take 1,200 mg by mouth 2 (two) times daily as needed. For chest tightness    . irbesartan-hydrochlorothiazide (AVALIDE) 300-12.5 MG tablet Take 1 tablet by mouth daily.    . meloxicam (MOBIC) 15 MG tablet 15 mg daily.  0  . olopatadine (PATANOL) 0.1 % ophthalmic solution Place 1 drop into both eyes 2 (two) times daily as needed. For allergies 5 mL 3  . OVER THE COUNTER MEDICATION Vitamin B6 100mg  daily in the am    . PROAIR HFA 108 (90 Base) MCG/ACT inhaler INHALE 2 PUFFS INTO THE LUNGS EVERY 6 HOURS AS NEEDED FOR WHEEZING OR SHORTNESS OF BREATH 8.5 g 0  . rosuvastatin (CRESTOR) 20 MG tablet Take 20 mg by mouth at bedtime.     [name] umeclidinium-vilanterol (ANORO ELLIPTA) 62.5-25 MCG/INH AEPB Inhale 1 puff into the lungs daily. 1 each 0   No current facility-administered  medications on file prior to visit.

## 2017-09-10 NOTE — Telephone Encounter (Signed)
Spoke with patient. She is aware of CY's recs. Will call in medication to her pharmacy. Nothing else needed at time of call.

## 2017-09-17 ENCOUNTER — Ambulatory Visit (INDEPENDENT_AMBULATORY_CARE_PROVIDER_SITE_OTHER): Payer: Medicare Other | Admitting: Internal Medicine

## 2017-09-17 DIAGNOSIS — J4541 Moderate persistent asthma with (acute) exacerbation: Secondary | ICD-10-CM

## 2017-09-17 LAB — PULMONARY FUNCTION TEST
DL/VA % pred: 85 %
DL/VA: 3.81 ml/min/mmHg/L
DLCO COR % PRED: 77 %
DLCO cor: 16.13 ml/min/mmHg
DLCO unc % pred: 77 %
DLCO unc: 16.23 ml/min/mmHg
FEF 25-75 Post: 2.63 L/sec
FEF 25-75 Pre: 2.14 L/sec
FEF2575-%Change-Post: 23 %
FEF2575-%PRED-POST: 163 %
FEF2575-%Pred-Pre: 132 %
FEV1-%Change-Post: 3 %
FEV1-%PRED-POST: 109 %
FEV1-%Pred-Pre: 105 %
FEV1-Post: 2.13 L
FEV1-Pre: 2.05 L
FEV1FVC-%CHANGE-POST: 4 %
FEV1FVC-%PRED-PRE: 108 %
FEV6-%Change-Post: 0 %
FEV6-%Pred-Post: 101 %
FEV6-%Pred-Pre: 102 %
FEV6-PRE: 2.52 L
FEV6-Post: 2.5 L
FEV6FVC-%Pred-Post: 105 %
FEV6FVC-%Pred-Pre: 105 %
FVC-%CHANGE-POST: 0 %
FVC-%PRED-POST: 96 %
FVC-%PRED-PRE: 97 %
FVC-POST: 2.5 L
FVC-Pre: 2.52 L
Post FEV1/FVC ratio: 85 %
Post FEV6/FVC ratio: 100 %
Pre FEV1/FVC ratio: 81 %
Pre FEV6/FVC Ratio: 100 %
RV % pred: 92 %
RV: 1.97 L
TLC % pred: 92 %
TLC: 4.34 L

## 2017-09-17 NOTE — Progress Notes (Signed)
PFT done today. 

## 2017-09-19 ENCOUNTER — Encounter: Payer: Self-pay | Admitting: Neurology

## 2017-09-19 ENCOUNTER — Ambulatory Visit (INDEPENDENT_AMBULATORY_CARE_PROVIDER_SITE_OTHER): Payer: Medicare Other | Admitting: Neurology

## 2017-09-19 VITALS — BP 147/68 | HR 63 | Ht 61.0 in | Wt 161.0 lb

## 2017-09-19 DIAGNOSIS — M859 Disorder of bone density and structure, unspecified: Secondary | ICD-10-CM | POA: Diagnosis not present

## 2017-09-19 DIAGNOSIS — Z0000 Encounter for general adult medical examination without abnormal findings: Secondary | ICD-10-CM | POA: Diagnosis not present

## 2017-09-19 DIAGNOSIS — E7849 Other hyperlipidemia: Secondary | ICD-10-CM | POA: Diagnosis not present

## 2017-09-19 DIAGNOSIS — Z9989 Dependence on other enabling machines and devices: Secondary | ICD-10-CM

## 2017-09-19 DIAGNOSIS — J449 Chronic obstructive pulmonary disease, unspecified: Secondary | ICD-10-CM | POA: Diagnosis not present

## 2017-09-19 DIAGNOSIS — G4733 Obstructive sleep apnea (adult) (pediatric): Secondary | ICD-10-CM | POA: Diagnosis not present

## 2017-09-19 DIAGNOSIS — R300 Dysuria: Secondary | ICD-10-CM | POA: Diagnosis not present

## 2017-09-19 DIAGNOSIS — G629 Polyneuropathy, unspecified: Secondary | ICD-10-CM

## 2017-09-19 DIAGNOSIS — I10 Essential (primary) hypertension: Secondary | ICD-10-CM | POA: Diagnosis not present

## 2017-09-19 DIAGNOSIS — E119 Type 2 diabetes mellitus without complications: Secondary | ICD-10-CM | POA: Diagnosis not present

## 2017-09-19 LAB — BASIC METABOLIC PANEL
BUN: 22 — AB (ref 4–21)
CREATININE: 0.9 (ref 0.5–1.1)
Glucose: 104
Potassium: 5.7 — AB (ref 3.4–5.3)
SODIUM: 141 (ref 137–147)

## 2017-09-19 LAB — CBC AND DIFFERENTIAL
HEMATOCRIT: 40 (ref 36–46)
HEMOGLOBIN: 13.1 (ref 12.0–16.0)
PLATELETS: 300 (ref 150–399)
WBC: 10.7

## 2017-09-19 LAB — LIPID PANEL
Cholesterol: 191 (ref 0–200)
HDL: 66 (ref 35–70)
LDL Cholesterol: 90
Triglycerides: 174 — AB (ref 40–160)

## 2017-09-19 LAB — HEPATIC FUNCTION PANEL
ALT: 21 (ref 7–35)
AST: 15 (ref 13–35)
Alkaline Phosphatase: 67 (ref 25–125)
Bilirubin, Total: 0.9

## 2017-09-19 LAB — VITAMIN D 25 HYDROXY (VIT D DEFICIENCY, FRACTURES): VIT D 25 HYDROXY: 39.2

## 2017-09-19 LAB — HEMOGLOBIN A1C: Hemoglobin A1C: 6.6

## 2017-09-19 LAB — MICROALBUMIN, URINE: Microalb, Ur: 6

## 2017-09-19 LAB — TSH: TSH: 3.59 (ref 0.41–5.90)

## 2017-09-19 NOTE — Progress Notes (Signed)
[facility]   Provider:  Larey Seat, M D  Referring Provider: Prince Solian, MD Primary Care Physician:  Prince Solian, MD  Chief Complaint  Patient presents with  . Follow-up    pt alone, rm 11, DME [facility], pt states all is well with CPAP    HPI:  Amber Roman is a 71 y.o. female , seen here as a revisit  from Dr. Dagmar Hait for CPAP compliance.  09-19-2017, [name] reports that she is doing well with her CPAP and she is highly compliant, using the CPAP machine 97% of the last 30 days, with an average user time of 8 hours 41 minutes, the machine is set at 7 cm water with 2 cm expiratory pressure relief. A residual AHI is 3.7. They seem to be minimal air leaks. She is followed by advanced home care also try it in [facility]. She reports sometimes that the head gear is wearing out the elastics are no longer as pliable. She has not experienced excessive daytime sleepiness, her Epworth score is endorsed at 2 points she endorses a high degree of fatigue at 51 points and the geriatric depression score was endorsed at 3 out of 15 points not indicative for clinical depression. She had a pulmonary function test Monday 09-17-2017, with Dr Baird Lyons, she had chronic coughing, C X Ray was clear- she suspects an allergic or viral cause. The cough lasted all summer and runny nose was part of it, non febrile but fatigued, loss of apetite. Prednisone finally cleared her wheezing.  She also has a stabile neuropathy and inquired about her chiropractor wanting to treat with laser.  I will change her to methylated Vit B 12 , and asked her to watch her carbohydrate intake ,she has a higher risk for diabetes    Chief complaint according to patient : " [place] and did [place] with my CPAP " ." I have trouble with finding the right words.",  " I have to move slower to not get dizzy"  CPAP In office download on 08-31-15 reveals very good compliance at 87% of over 4  hours of daily use. Her machine is on average used for 8 hours and 3 minutes. CPAP is set at 7 cm water pressure was 2 cm EPR she does have a minor air leak, her AHI is 4.8. Her Epworth sleepiness score is endorsed at 3 points fatigue severity at 20 points and geriatric depression at one point. She doesn't feel depressed anymore she has spent a good [place] in the mountains. She states that she is still unsteady and that was the tinnitus and balance problems that she complained about last year she has learned to live with. She is just more careful as she walks she had to be careful.  She had fallen once but she stumbled over a branch so there was a mechanical barrier at fault. She had no other falls in the house or outdoors. She had no fractures or injuries. In terms of memory she feels sometimes scattered or having trouble to find the right word especially when she feels under time pressure, or socially awkward. [name]   08-19-14 ;  last visit with compliant of acute onset of some tinnitus. She was evaluated in detail by ENT physician Dr. Thornell Mule. He performed an electronystagmogram which was within normal limits velocity, latency and accuracy of saccades, optokinetic responses Dix-Hallpike maneuver caloric irrigation, hearing and external ear examination were normal and  a brain MRI has been normal.no diplopia, no vertigo .  I can certainly rule out a stroke as causing this patient's problems and it seems that from the ear nose and throat perspective, there is no explanation for the tinnitus either. She is unsteady.  In office download of CPAP revealed an AHI of 4.7 a user time of 7 hours and 32 minutes at home on average as a pressure of 7 cm with an EPR level of 2 and over the last 76 days and compliance of 93%. Sleep habits are as follows: bed time is between 10 and 11 PM, falls asleep after 45 minutes. Sleeps alone, with her dog. Wake up spontaneously since retired.    Interval history from 09/04/2016, as  the pleasure of seeing [name] today who states that she has had spells of dizziness with rapid head movements, numbness and loss of sensory in the big toe of the right foot but no concerns related to sleep or memory. She does have tinnitus. The dizziness is clearly vertigo. She has seen an ear nose and throat specialist, Dr. Thornell Mule for hearing testing and evaluation, but she has not had vestibular treatments. She announced that she will not tolerate a caloric test. Interval history for her CPAP the patient's 90% compliance 27th of days out of the last 30 she has used her CPAP average daytime use is 7 hours and 52 minutes, set pressure is 7 cm water with an EPR level of 2, residual AHI is 4.1. They will air leak is noted. Epworth sleepiness score endorsed at 1, fatigue severity at 35 points and geriatric depression score at one point patient also underwent a Montral cognitive assessment this 28 out of 30 points well in normal limits. She missed two uncued  points in delayed recall.    Review of Systems: Out of a complete 14 system review, the patient complains of only the following symptoms, and all other reviewed systems are negative. See above ,  Hearing tests were normal- foot neuropathy- mild and not ascending. Numbness- OSA on CPAP, fatigued , not sleepy.     Social History   Social History  . Marital status: Single    Spouse name: N/A  . Number of children: 1  . Years of education: College   Occupational History  . Not on file.   Social History Main Topics  . Smoking status: Former Smoker    Packs/day: 2.00    Years: 15.00    Types: Cigarettes    Quit date: 12/18/1978  . Smokeless tobacco: Never Used  . Alcohol use No  . Drug use: No     Comment: hx of marijuana use   . Sexual activity: Not on file   Other Topics Concern  . Not on file   Social History Narrative   Patient is single.   Patient has a college education.   Patient drinks one caffeine drink per day.    Patient is right-handed.   Patient has one child.          Family History  Problem Relation Age of Onset  . Heart attack Father   . High blood pressure Father   . Multiple sclerosis Father   . Lung disease Mother   . Colon cancer Brother   . Colon cancer Brother   . High blood pressure Brother   . Diabetes Mellitus II Sister   . High blood pressure Sister   . Obesity Sister     Past Medical History:  Diagnosis Date  . Allergic conjunctivitis   . Arthritis   . Bronchitis   . Bruxism, sleep-related 04/13/2014  . Depression   . Esophageal reflux   . Family history of heart disease   . H/O abdominal hysterectomy    w/o recurrence  . Hyperlipidemia   . Hypertension   . Insomnia 04/13/2014  . Obstructive sleep apnea    improved on C Pap  . Uterine cancer [facility])    hysterectomy    Past Surgical History:  Procedure Laterality Date  . BILATERAL SALPINGOOPHORECTOMY    . CARPAL TUNNEL RELEASE     bilateral  . DOPPLER ECHOCARDIOGRAPHY    . NASAL SEPTUM SURGERY    . NM MYOVIEW LTD    . TOTAL ABDOMINAL HYSTERECTOMY    . TOTAL HIP ARTHROPLASTY  01/10/2013   Procedure: TOTAL HIP ARTHROPLASTY ANTERIOR APPROACH;  Surgeon: Mcarthur Rossetti, MD;  Location: [facility];  Service: Orthopedics;  Laterality: Right;    Current Outpatient Prescriptions  Medication Sig Dispense Refill  . aspirin 81 MG tablet Take 81 mg by mouth daily.    . diphenoxylate-atropine (LOMOTIL) 2.5-0.025 MG per tablet Take 1 tablet by mouth 4 (four) times daily as needed. For stomach    . DULoxetine (CYMBALTA) 60 MG capsule Take 1 capsule by mouth daily.  1  . fluticasone (FLONASE) 50 MCG/ACT nasal spray 1-2 puffs each nostril once daily while needed 16 g prn  . fluticasone furoate-vilanterol (BREO ELLIPTA) 200-25 MCG/INH AEPB Inhale 1 puff into the lungs daily.    [name] gabapentin (NEURONTIN) 300 MG capsule at bedtime.   0  . guaiFENesin (MUCINEX) 600 MG 12 hr tablet Take 1,200 mg by mouth 2 (two) times daily  as needed. For chest tightness    . irbesartan-hydrochlorothiazide (AVALIDE) 300-12.5 MG tablet Take 1 tablet by mouth daily.    . meloxicam (MOBIC) 15 MG tablet 15 mg daily.  0  . olopatadine (PATANOL) 0.1 % ophthalmic solution Place 1 drop into both eyes 2 (two) times daily as needed. For allergies 5 mL 3  . OVER THE COUNTER MEDICATION Vitamin B6 100mg  daily in the am    . predniSONE (DELTASONE) 10 MG tablet Take 4 tabs for 2 days, then 3 tabs for 2 days, 2 tabs for 2 days, then 1 tab for 2 days, then stop. 20 tablet 0  . PROAIR HFA 108 (90 Base) MCG/ACT inhaler INHALE 2 PUFFS INTO THE LUNGS EVERY 6 HOURS AS NEEDED FOR WHEEZING OR SHORTNESS OF BREATH 8.5 g 0  . rosuvastatin (CRESTOR) 20 MG tablet Take 20 mg by mouth at bedtime.     [name] umeclidinium-vilanterol (ANORO ELLIPTA) 62.5-25 MCG/INH AEPB Inhale 1 puff into the lungs daily. 1 each 0   No current facility-administered medications for this visit.     Allergies as of 09/19/2017 - Review Complete 09/19/2017  Allergen Reaction Noted  . Sulfonamide derivatives Other (See Comments)     Vitals: BP (!) 147/68   Pulse 63   Ht 5\' 1"  (1.549 m)   Wt 161 lb (73 kg)   BMI 30.42 kg/m  Last Weight:  Wt Readings from Last 1 Encounters:  09/19/17 161 lb (73 kg)   KVQ:QVZD mass index is 30.42 kg/m.     Last Height:   Ht Readings from Last 1 Encounters:  09/19/17 5\' 1"  (1.549 m)    Physical exam:  General: The patient is awake, alert and appears not in acute distress. The patient is well groomed. Head: Normocephalic,  atraumatic. Neck is supple. Mallampati 3,  neck circumference:16.5  Nasal airflow untrestricted, the is a nasal septal deviation , TMJ click not  evident . Retrognathia is seen.  Cardiovascular:  Regular rate and rhythm  without  murmurs or carotid bruit, and without distended neck veins. Respiratory: Lungs are clear to auscultation. Skin:  Without evidence of edema, or rash Trunk: BMI is elevated . The patient's posture is  slightly hunched.   Neurologic exam : The patient is awake and alert, oriented to place and time.   Memory subjectivedescribed as intact.  Memory testing revealed word finding delay.  MOCA:  Montreal Cognitive Assessment  09/04/2016  Visuospatial/ Executive (0/5) 5  Naming (0/3) 3  Attention: Read list of digits (0/2) 2  Attention: Read list of letters (0/1) 1  Attention: Serial 7 subtraction starting at 100 (0/3) 3  Language: Repeat phrase (0/2) 2  Language : Fluency (0/1) 1  Abstraction (0/2) 2  Delayed Recall (0/5) 3  Orientation (0/6) 6  Total 28  Adjusted Score (based on education) 28    Attention span & concentration ability appears normal.  Speech is fluent,  without  dysarthria, dysphonia or aphasia.  Mood and affect are appropriate.  Cranial nerves: Pupils are equal and briskly reactive to light. She just had a cataract lens replacement on the right eye and is awaiting the left to be surgically altered and of this month. Extraocular movements  in vertical and horizontal planes intact . She was able to pursue a moving object without developing nystagmus.  Visual fields by finger perimetry are intact. Her hearing is intact to finger rub.  Facial motor strength is symmetric and tongue and uvula move midline. Shoulder shrug was symmetrical.   Motor exam: Normal tone, muscle bulk and symmetric strength in all extremities.  The patient was advised of the nature of the diagnosed sleep disorder , the treatment options and risks for general a health and wellness arising from not treating the condition.  I spent more than 25 minutes of face to face time with the patient. Greater than 50% of time was spent in counseling and coordination of care. We have discussed the diagnosis and differential and I answered the patient's questions.     Assessment:  After physical and neurologic examination, review of laboratory studies,  Personal review of imaging studies, reports of other /same   Imaging studies ,  Results of polysomnography/ neurophysiology testing and pre-existing records as far as provided in visit., my assessment is   1) is associated with tinnitus manifests now as VERTIGO . Her Romberg test is negative . She had no recent falls and she has adjusted and adapted.  The tinnitus is not bothering her nearly as much as last year. Referral to vestibular rehab. Clockwise rotation of the surrounding.  2) OSA is well treated on CPAP with a residual AHI of 3.7 at 7 cm water pressure with 2 cm EPR 3) Montral cognitive assessment test for word finding difficulties; t the patient has scored 28 out of 30 points, which is normal for age and her degree of education.  Montreal Cognitive Assessment  09/04/2016  Visuospatial/ Executive (0/5) 5  Naming (0/3) 3  Attention: Read list of digits (0/2) 2  Attention: Read list of letters (0/1) 1  Attention: Serial 7 subtraction starting at 100 (0/3) 3  Language: Repeat phrase (0/2) 2  Language : Fluency (0/1) 1  Abstraction (0/2) 2  Delayed Recall (0/5) 3  Orientation (0/6) 6  Total  28  Adjusted Score (based on education) 28      Plan:  Treatment plan and additional workup :  Continue CPAP, no changes in settings is necessary at this point .   The patient has become a very careful walker and she has learned to live with the tinnitus and the imbalance feeling it is no longer hampering her ability to function in daily life.   As to the word finding difficulties there at this time not enough to be called mild cognitive impairment but I would like to continue testing her each year when she comes back for her compliance visit. MOCA next year.   CC Dr. Hortencia Conradi Sondi Desch MD  09/19/2017   CC: Prince Solian, [address] [place], [place] [id number]

## 2017-09-24 ENCOUNTER — Telehealth: Payer: Self-pay | Admitting: Internal Medicine

## 2017-09-24 NOTE — Telephone Encounter (Signed)
CY please advise on results.

## 2017-09-26 DIAGNOSIS — Z23 Encounter for immunization: Secondary | ICD-10-CM | POA: Diagnosis not present

## 2017-09-26 DIAGNOSIS — K635 Polyp of colon: Secondary | ICD-10-CM | POA: Diagnosis not present

## 2017-09-26 DIAGNOSIS — E119 Type 2 diabetes mellitus without complications: Secondary | ICD-10-CM | POA: Diagnosis not present

## 2017-09-26 DIAGNOSIS — Z0000 Encounter for general adult medical examination without abnormal findings: Secondary | ICD-10-CM | POA: Diagnosis not present

## 2017-09-26 DIAGNOSIS — Z6828 Body mass index (BMI) 28.0-28.9, adult: Secondary | ICD-10-CM | POA: Diagnosis not present

## 2017-09-26 DIAGNOSIS — J3089 Other allergic rhinitis: Secondary | ICD-10-CM | POA: Diagnosis not present

## 2017-09-26 DIAGNOSIS — G4733 Obstructive sleep apnea (adult) (pediatric): Secondary | ICD-10-CM | POA: Diagnosis not present

## 2017-09-26 DIAGNOSIS — I10 Essential (primary) hypertension: Secondary | ICD-10-CM | POA: Diagnosis not present

## 2017-09-26 DIAGNOSIS — J45909 Unspecified asthma, uncomplicated: Secondary | ICD-10-CM | POA: Diagnosis not present

## 2017-09-26 DIAGNOSIS — M859 Disorder of bone density and structure, unspecified: Secondary | ICD-10-CM | POA: Diagnosis not present

## 2017-09-26 DIAGNOSIS — G6289 Other specified polyneuropathies: Secondary | ICD-10-CM | POA: Diagnosis not present

## 2017-09-26 DIAGNOSIS — E7849 Other hyperlipidemia: Secondary | ICD-10-CM | POA: Diagnosis not present

## 2017-09-27 NOTE — Telephone Encounter (Signed)
See PFT- pt has been made aware of results.  Nothing further needed.

## 2017-09-29 ENCOUNTER — Other Ambulatory Visit: Payer: Self-pay | Admitting: Internal Medicine

## 2017-10-07 NOTE — Assessment & Plan Note (Addendum)
Mild exacerbation, possibly seasonal but she is bothered by recent hot humid weather. Not clear how useful cortisone component is for this former smoker. Plan-try replacing Bfreo with Anoro. Schedule PFT, CXR, labs for eosinophils and Allergy Profile. Today nebulizer treatments Xopenex, Depo-Medrol

## 2017-10-07 NOTE — Assessment & Plan Note (Signed)
Managed managed by neurology. She reports good compliance.

## 2017-10-22 DIAGNOSIS — Z961 Presence of intraocular lens: Secondary | ICD-10-CM | POA: Diagnosis not present

## 2017-10-22 DIAGNOSIS — H52203 Unspecified astigmatism, bilateral: Secondary | ICD-10-CM | POA: Diagnosis not present

## 2017-10-22 DIAGNOSIS — H353131 Nonexudative age-related macular degeneration, bilateral, early dry stage: Secondary | ICD-10-CM | POA: Diagnosis not present

## 2017-10-24 DIAGNOSIS — M75101 Unspecified rotator cuff tear or rupture of right shoulder, not specified as traumatic: Secondary | ICD-10-CM | POA: Diagnosis not present

## 2017-10-24 DIAGNOSIS — L821 Other seborrheic keratosis: Secondary | ICD-10-CM | POA: Diagnosis not present

## 2017-10-24 DIAGNOSIS — L918 Other hypertrophic disorders of the skin: Secondary | ICD-10-CM | POA: Diagnosis not present

## 2017-10-24 DIAGNOSIS — D229 Melanocytic nevi, unspecified: Secondary | ICD-10-CM | POA: Diagnosis not present

## 2017-11-05 ENCOUNTER — Ambulatory Visit: Payer: Medicare Other | Admitting: Internal Medicine

## 2017-11-12 DIAGNOSIS — M25511 Pain in right shoulder: Secondary | ICD-10-CM | POA: Diagnosis not present

## 2017-11-30 ENCOUNTER — Other Ambulatory Visit: Payer: Self-pay | Admitting: Internal Medicine

## 2017-12-03 DIAGNOSIS — Z1231 Encounter for screening mammogram for malignant neoplasm of breast: Secondary | ICD-10-CM | POA: Diagnosis not present

## 2017-12-03 DIAGNOSIS — H6122 Impacted cerumen, left ear: Secondary | ICD-10-CM | POA: Diagnosis not present

## 2017-12-04 ENCOUNTER — Ambulatory Visit (INDEPENDENT_AMBULATORY_CARE_PROVIDER_SITE_OTHER): Payer: Medicare Other | Admitting: Internal Medicine

## 2017-12-04 ENCOUNTER — Encounter: Payer: Self-pay | Admitting: Internal Medicine

## 2017-12-04 ENCOUNTER — Encounter: Payer: Self-pay | Admitting: Cardiovascular Disease

## 2017-12-04 ENCOUNTER — Ambulatory Visit (INDEPENDENT_AMBULATORY_CARE_PROVIDER_SITE_OTHER): Payer: Medicare Other | Admitting: Cardiovascular Disease

## 2017-12-04 VITALS — BP 140/72 | HR 62 | Ht 61.5 in | Wt 161.0 lb

## 2017-12-04 DIAGNOSIS — I739 Peripheral vascular disease, unspecified: Secondary | ICD-10-CM | POA: Diagnosis not present

## 2017-12-04 DIAGNOSIS — J4520 Mild intermittent asthma, uncomplicated: Secondary | ICD-10-CM | POA: Diagnosis not present

## 2017-12-04 DIAGNOSIS — G4733 Obstructive sleep apnea (adult) (pediatric): Secondary | ICD-10-CM | POA: Diagnosis not present

## 2017-12-04 DIAGNOSIS — I10 Essential (primary) hypertension: Secondary | ICD-10-CM

## 2017-12-04 DIAGNOSIS — Z9989 Dependence on other enabling machines and devices: Secondary | ICD-10-CM

## 2017-12-04 NOTE — Assessment & Plan Note (Signed)
History of hyperlipidemia on statin therapy followed by her PCP. 

## 2017-12-04 NOTE — Assessment & Plan Note (Signed)
History of essential hypertension with blood pressure measures today 140/72. She is on Avalide. Continue current meds at current dosing

## 2017-12-04 NOTE — Patient Instructions (Addendum)
Ok to continue Anoro daily and to use the albuterol rescue inhaler if you need  Please call if we can help  You can ask Dr Dagmar Hait if you have had the Prevnar 13 pneumonia vaccine.

## 2017-12-04 NOTE — Assessment & Plan Note (Signed)
Excellent control.  She does not need an ICS and is very pleased with airway control from her LABA/LAMA Anoro. Plan-continue current management.  She wants to continue to be seen here once a year.

## 2017-12-04 NOTE — Progress Notes (Signed)
12/04/2017 Amber Roman   Jun 17, 1946  [id number]  Primary Physician Amber Solian, MD Primary Cardiologist: Amber Harp MD Amber Roman, [place]  HPI:  Amber Roman is a 71 y.o.  fit-appearing, divorced Caucasian female, mother of one 67 year-old [place] adopted daughter named Amber Roman who she adopted when she was 71 years old and has recently graduated from [employer]. She currently lives in [place]... I last saw her in the office 11/21/16. She has a history of hypertension, hyperlipidemia, and a strong family history for heart disease with a father who had his first MI at age 64 and died 71 years later of a massive myocardial infarction. Her last Myoview stress test performed 10/25/12 was not ischemic.She has never had a heart attack or stroke, otherwise is asymptomatic. Her other problems include uterine cancer having undergone a total abdominal hysterectomy without recurrence 12 years ago. Dr. Dagmar Roman follows her lipid profile. She had an uncomplicated total hip replacement by Dr. Zollie Roman 01/09/2013. Since I saw her back she denies chest pain but does complain of occasional shortness of breath on exertion. She had negative breathing studies by Dr. Annamaria Roman recently.  She was diagnosed obstructive sleep apnea by Dr. Fernande Roman and has been a remarkable responder to CPAP. Since I saw her year ago her major complaint is new onset claudication in both calves.    Current Meds  Medication Sig  . ANORO ELLIPTA 62.5-25 MCG/INH AEPB INHALE 1 PUFF INTO THE LUNGS DAILY  . aspirin 81 MG tablet Take 81 mg by mouth daily.  . diphenoxylate-atropine (LOMOTIL) 2.5-0.025 MG per tablet Take 1 tablet by mouth 4 (four) times daily as needed. For stomach  . DULoxetine (CYMBALTA) 60 MG capsule Take 1 capsule by mouth daily.  . fluticasone (FLONASE) 50 MCG/ACT nasal spray 1-2 puffs each nostril once daily while needed  . gabapentin (NEURONTIN) 300 MG capsule at bedtime.   [name] guaiFENesin (MUCINEX) 600  MG 12 hr tablet Take 1,200 mg by mouth 2 (two) times daily as needed. For chest tightness  . irbesartan-hydrochlorothiazide (AVALIDE) 300-12.5 MG tablet Take 1 tablet by mouth daily.  . meloxicam (MOBIC) 15 MG tablet 15 mg daily.  [name] olopatadine (PATANOL) 0.1 % ophthalmic solution Place 1 drop into both eyes 2 (two) times daily as needed. For allergies  . OVER THE COUNTER MEDICATION Vitamin B6 100mg  daily in the am  . PROAIR HFA 108 (90 Base) MCG/ACT inhaler INHALE 2 PUFFS INTO THE LUNGS EVERY 6 HOURS AS NEEDED FOR WHEEZING OR SHORTNESS OF BREATH  . rosuvastatin (CRESTOR) 20 MG tablet Take 20 mg by mouth at bedtime.      Allergies  Allergen Reactions  . Sulfonamide Derivatives Other (See Comments)    Childhood allergy     Social History   Socioeconomic History  . Marital status: Single    Spouse name: Not on file  . Number of children: 1  . Years of education: College  . Highest education level: Not on file  Social Needs  . Financial resource strain: Not on file  . Food insecurity - worry: Not on file  . Food insecurity - inability: Not on file  . Transportation needs - medical: Not on file  . Transportation needs - non-medical: Not on file  Occupational History  . Not on file  Tobacco Use  . Smoking status: Former Smoker    Packs/day: 2.00    Years: 15.00    Pack years: 30.00    Types: Cigarettes  Last attempt to quit: 12/18/1978    Years since quitting: 38.9  . Smokeless tobacco: Never Used  Substance and Sexual Activity  . Alcohol use: No  . Drug use: No    Comment: hx of marijuana use   . Sexual activity: Not on file  Other Topics Concern  . Not on file  Social History Narrative   Patient is single.   Patient has a college education.   Patient drinks one caffeine drink per day.   Patient is right-handed.   Patient has one child.           Review of Systems: General: negative for chills, fever, night sweats or weight changes.  Cardiovascular: negative for  chest pain, dyspnea on exertion, edema, orthopnea, palpitations, paroxysmal nocturnal dyspnea or shortness of breath Dermatological: negative for rash Respiratory: negative for cough or wheezing Urologic: negative for hematuria Abdominal: negative for nausea, vomiting, diarrhea, bright red blood per rectum, melena, or hematemesis Neurologic: negative for visual changes, syncope, or dizziness All other systems reviewed and are otherwise negative except as noted above.    Blood pressure 140/72, pulse 62, height 5' 1.5" (1.562 m), weight 161 lb (73 kg).  General appearance: alert and no distress Neck: no adenopathy, no carotid bruit, no JVD, supple, symmetrical, trachea midline and thyroid not enlarged, symmetric, no tenderness/mass/nodules Lungs: clear to auscultation bilaterally Heart: regular rate and rhythm, S1, S2 normal, no murmur, click, rub or gallop Extremities: extremities normal, atraumatic, no cyanosis or edema Pulses: 2+ and symmetric Skin: Skin color, texture, turgor normal. No rashes or lesions Neurologic: Alert and oriented X 3, normal strength and tone. Normal symmetric reflexes. Normal coordination and gait  EKG sinus rhythm at 62 with septal Q waves. I personally reviewed this EKG.  ASSESSMENT AND PLAN:   HYPERLIPIDEMIA History of hyperlipidemia on statin therapy followed by her PCP  Essential hypertension History of essential hypertension with blood pressure measures today 140/72. She is on Avalide. Continue current meds at current dosing  Claudication in peripheral vascular disease ([place]) New symptoms of calf claudication with positive risk factors. I'm going to obtain lower extremity arterial Doppler studies to further evaluate.      Amber Harp MD FACP,FACC,FAHA, [facility] 12/04/2017 2:02 PM

## 2017-12-04 NOTE — Assessment & Plan Note (Signed)
She is managed by Neurology, noted for documentation.

## 2017-12-04 NOTE — Progress Notes (Signed)
HPI  female former smoker followed for asthma/bronchitis, rhinitis, allergic conjunctivitis, complicated by OSA ([facility]/ Dr Brett Fairy), GERD  ---------------------------------------------------------------------------  09/03/17- 71 year old female former smoker followed for asthma/bronchitis, rhinitis, allergic conjunctivitis, complicated by OSA ([facility]), GERD ACUTE VISIT: Chest tightness, cough-productive-yellow in color, fatigued, runny nose. Denies any fever, SOB, or wheeze. Seen here in June and August for bronchitis. PFT ordered after the office visit but not done Breo 200, ProAir says she never uses rescue inhaler but does use Breo. Feeling tight in chest with scant yellow sputum. Prednisone taper and August did help. She thinks triggers [place] environment-air, humidity, heat and says she feels better when away. We discussed home environment.  12/04/17- 70 year old female former smoker followed for asthma/bronchitis, rhinitis, allergic conjunctivitis, complicated by OSA ([facility]), GERD CPAP 7/ managed by Neurology Dr Brett Fairy -Asthma: Pt had PFT 09-17-17; review in greater detail. Pt states she is doing well overall.  Proair, Anoro Has a cabin in [place] and says she breathes better there, planning to spend warm months there but still return to [place] for cold weather and medical visits. "Loves" her current medication.  Never needs rescue inhaler and has no sleep disturbance as long as she continues Anoro once daily. We reviewed her PFT and discussed management.  CXR 09/03/17- IMPRESSION: No active cardiopulmonary disease. PFT 09/17/17-minimal diffusion defect.  FVC 2.50/96%, FEV1 2.13/109%, ratio 0.85, FEF 25-75% 2.63/163%, no response to dilator.  TLC 92%, DLCO 77%.  ROS-see HPI + = positive Constitutional:   No-   weight loss, night sweats, fevers, chills, fatigue, lassitude. HEENT:   No-  headaches, difficulty swallowing, tooth/dental problems, sore throat,     No- sneezing,  itching, ear ache,             nasal congestion, post nasal drip,  CV:  No-   chest pain, orthopnea, PND, swelling in lower extremities, anasarca, dizziness, palpitations Resp: No-   shortness of breath with exertion or at rest.                No- coughing up of blood, + productive cough-none, +nonproductive cough            change in color of mucus.  + Occasional wheezing.   Skin: No-   rash or lesions. GI:  No-   heartburn, indigestion, abdominal pain, nausea, vomiting,  GU:   Neuro-     nothing unusual Psych:  No- change in mood or affect. No depression or anxiety.  No memory loss.  OBJ General- Alert, Oriented, Affect-appropriate, Distress- none acute, + overweight Skin- rash-none, lesions- none, excoriation- none Lymphadenopathy- none Head- atraumatic            Eyes- Gross vision intact, PERRLA, conjunctivae look pale, clear secretions            Ears- Hearing,             Nose- , no-Septal dev, mucus, polyps, erosion, perforation             Throat- Mallampati III , mucosa clear , drainage- none, tonsils- atrophic Neck- flexible , trachea midline, no stridor , thyroid nl, carotid no bruit Chest - symmetrical excursion , unlabored           Heart/CV- RRR , no murmur , no gallop  , no rub, nl s1 s2                           - JVD- none , edema-  none, stasis changes- none, varices- none           Lung- clear to P&A, wheeze- none, cough-none, dullness-none, rub- none           Chest wall-  Abd-  Br/ Gen/ Rectal- Not done, not indicated Extrem- cyanosis- none, clubbing, none, atrophy- none, strength- nl Neuro- grossly intact to observation. No nystagmus, and no carotid bruit.

## 2017-12-04 NOTE — Assessment & Plan Note (Signed)
New symptoms of calf claudication with positive risk factors. I'm going to obtain lower extremity arterial Doppler studies to further evaluate.

## 2017-12-04 NOTE — Patient Instructions (Signed)
Medication Instructions: Your physician recommends that you continue on your current medications as directed. Please refer to the Current Medication list given to you today.  Labwork: I have requested your recent lab work from Dr. Dagmar Hait.  Testing/Procedures: Your physician has requested that you have a lower extremity arterial duplex. During this test, ultrasound is used to evaluate arterial blood flow in the legs. Allow one hour for this exam. There are no restrictions or special instructions.  Your physician has requested that you have an ankle brachial index (ABI). During this test an ultrasound and blood pressure cuff are used to evaluate the arteries that supply the arms and legs with blood. Allow thirty minutes for this exam. There are no restrictions or special instructions.  Follow-Up: Your physician wants you to follow-up in: 1 year with Dr. Gwenlyn Found. You will receive a reminder letter in the mail two months in advance. If you don't receive a letter, please call our office to schedule the follow-up appointment.  If you need a refill on your cardiac medications before your next appointment, please call your pharmacy.

## 2017-12-12 DIAGNOSIS — M75101 Unspecified rotator cuff tear or rupture of right shoulder, not specified as traumatic: Secondary | ICD-10-CM | POA: Diagnosis not present

## 2017-12-12 DIAGNOSIS — M25511 Pain in right shoulder: Secondary | ICD-10-CM | POA: Diagnosis not present

## 2017-12-14 ENCOUNTER — Ambulatory Visit (HOSPITAL_COMMUNITY)
Admission: RE | Admit: 2017-12-14 | Discharge: 2017-12-14 | Disposition: A | Discharge status: Home / Self Care | Payer: Medicare Other | Source: Ambulatory Visit | Attending: Cardiovascular Disease | Admitting: Cardiovascular Disease

## 2017-12-14 DIAGNOSIS — I739 Peripheral vascular disease, unspecified: Secondary | ICD-10-CM

## 2017-12-19 DIAGNOSIS — M75121 Complete rotator cuff tear or rupture of right shoulder, not specified as traumatic: Secondary | ICD-10-CM | POA: Diagnosis not present

## 2017-12-27 ENCOUNTER — Telehealth: Payer: Self-pay

## 2017-12-27 ENCOUNTER — Other Ambulatory Visit: Payer: Self-pay | Admitting: Internal Medicine

## 2017-12-27 NOTE — Telephone Encounter (Signed)
   [facility] Pre-operative Risk Assessment    Request for surgical clearance:  1. What type of surgery is being performed? Right Shoulder Scope and rotator cuff repair  2. When is this surgery scheduled? 02/27/18   3. Are there any medications that need to be held prior to surgery and how long? Aspirin  4. Practice name and name of physician performing surgery? [facility]  5. What is your office phone and fax number? Phone # [phone]  Fax # [phone]  6. Anesthesia type (None, local, MAC, general) ? Karie Soda 12/27/2017, 11:13 AM  _________________________________________________________________   (provider comments below)

## 2017-12-28 NOTE — Telephone Encounter (Signed)
Patient last seen by Dr. Gwenlyn Found 12/04/17.  ABIs were obtained after that visit.  These were normal.    The patient's RCRI is 0 indicating a perioperative risk of major cardiac event of 0.4%.    A message has been left with her today to contact us back to go over any change in her symptoms since last seen. Richardson Dopp, PA-C    12/28/2017 12:17 PM

## 2017-12-31 NOTE — Telephone Encounter (Signed)
   Primary Cardiologist: Quay Burow, MD  Chart reviewed as part of pre-operative protocol coverage. Given past medical history and time since last visit, based on ACC/AHA guidelines, LAINE FONNER would be at acceptable risk for the planned procedure without further cardiovascular testing.  If any problems prior to procedure she has been informed to call Dr. Kennon Holter office.   Ok to hold Asprin 5 days prior and to resume as soon as possible post procedure.    I will route this recommendation to the requesting party via Epic fax function and remove from pre-op pool.  Please call with questions.  Cecilie Kicks, NP 12/31/2017, 3:24 PM

## 2018-01-02 NOTE — Telephone Encounter (Signed)
Faxed via EPIC to requesting provider 

## 2018-03-07 DIAGNOSIS — M7521 Bicipital tendinitis, right shoulder: Secondary | ICD-10-CM | POA: Diagnosis not present

## 2018-03-07 DIAGNOSIS — M24111 Other articular cartilage disorders, right shoulder: Secondary | ICD-10-CM | POA: Diagnosis not present

## 2018-03-07 DIAGNOSIS — M19011 Primary osteoarthritis, right shoulder: Secondary | ICD-10-CM | POA: Diagnosis not present

## 2018-03-07 DIAGNOSIS — M7581 Other shoulder lesions, right shoulder: Secondary | ICD-10-CM | POA: Diagnosis not present

## 2018-03-07 DIAGNOSIS — M7541 Impingement syndrome of right shoulder: Secondary | ICD-10-CM | POA: Diagnosis not present

## 2018-03-07 DIAGNOSIS — G8918 Other acute postprocedural pain: Secondary | ICD-10-CM | POA: Diagnosis not present

## 2018-03-07 DIAGNOSIS — S46011A Strain of muscle(s) and tendon(s) of the rotator cuff of right shoulder, initial encounter: Secondary | ICD-10-CM | POA: Diagnosis not present

## 2018-03-07 DIAGNOSIS — M75121 Complete rotator cuff tear or rupture of right shoulder, not specified as traumatic: Secondary | ICD-10-CM | POA: Diagnosis not present

## 2018-03-13 DIAGNOSIS — S46011D Strain of muscle(s) and tendon(s) of the rotator cuff of right shoulder, subsequent encounter: Secondary | ICD-10-CM | POA: Diagnosis not present

## 2018-04-10 DIAGNOSIS — M24111 Other articular cartilage disorders, right shoulder: Secondary | ICD-10-CM | POA: Diagnosis not present

## 2018-04-10 DIAGNOSIS — M7541 Impingement syndrome of right shoulder: Secondary | ICD-10-CM | POA: Diagnosis not present

## 2018-04-10 DIAGNOSIS — S46011D Strain of muscle(s) and tendon(s) of the rotator cuff of right shoulder, subsequent encounter: Secondary | ICD-10-CM | POA: Diagnosis not present

## 2018-04-15 DIAGNOSIS — Z1389 Encounter for screening for other disorder: Secondary | ICD-10-CM | POA: Diagnosis not present

## 2018-04-15 DIAGNOSIS — Z6827 Body mass index (BMI) 27.0-27.9, adult: Secondary | ICD-10-CM | POA: Diagnosis not present

## 2018-04-15 DIAGNOSIS — J45909 Unspecified asthma, uncomplicated: Secondary | ICD-10-CM | POA: Diagnosis not present

## 2018-04-15 DIAGNOSIS — G6289 Other specified polyneuropathies: Secondary | ICD-10-CM | POA: Diagnosis not present

## 2018-04-15 DIAGNOSIS — E7849 Other hyperlipidemia: Secondary | ICD-10-CM | POA: Diagnosis not present

## 2018-04-15 DIAGNOSIS — E1169 Type 2 diabetes mellitus with other specified complication: Secondary | ICD-10-CM | POA: Diagnosis not present

## 2018-04-15 DIAGNOSIS — M859 Disorder of bone density and structure, unspecified: Secondary | ICD-10-CM | POA: Diagnosis not present

## 2018-04-15 DIAGNOSIS — G4733 Obstructive sleep apnea (adult) (pediatric): Secondary | ICD-10-CM | POA: Diagnosis not present

## 2018-04-15 DIAGNOSIS — I10 Essential (primary) hypertension: Secondary | ICD-10-CM | POA: Diagnosis not present

## 2018-04-15 LAB — HM DEXA SCAN: HM Dexa Scan: -2.4

## 2018-04-16 DIAGNOSIS — M6281 Muscle weakness (generalized): Secondary | ICD-10-CM | POA: Diagnosis not present

## 2018-04-16 DIAGNOSIS — M25511 Pain in right shoulder: Secondary | ICD-10-CM | POA: Diagnosis not present

## 2018-04-16 DIAGNOSIS — S46011D Strain of muscle(s) and tendon(s) of the rotator cuff of right shoulder, subsequent encounter: Secondary | ICD-10-CM | POA: Diagnosis not present

## 2018-04-18 DIAGNOSIS — S46011D Strain of muscle(s) and tendon(s) of the rotator cuff of right shoulder, subsequent encounter: Secondary | ICD-10-CM | POA: Diagnosis not present

## 2018-04-18 DIAGNOSIS — M25511 Pain in right shoulder: Secondary | ICD-10-CM | POA: Diagnosis not present

## 2018-04-18 DIAGNOSIS — M6281 Muscle weakness (generalized): Secondary | ICD-10-CM | POA: Diagnosis not present

## 2018-04-23 DIAGNOSIS — S46011D Strain of muscle(s) and tendon(s) of the rotator cuff of right shoulder, subsequent encounter: Secondary | ICD-10-CM | POA: Diagnosis not present

## 2018-04-23 DIAGNOSIS — M6281 Muscle weakness (generalized): Secondary | ICD-10-CM | POA: Diagnosis not present

## 2018-04-23 DIAGNOSIS — M25511 Pain in right shoulder: Secondary | ICD-10-CM | POA: Diagnosis not present

## 2018-04-25 DIAGNOSIS — S46011D Strain of muscle(s) and tendon(s) of the rotator cuff of right shoulder, subsequent encounter: Secondary | ICD-10-CM | POA: Diagnosis not present

## 2018-04-25 DIAGNOSIS — M6281 Muscle weakness (generalized): Secondary | ICD-10-CM | POA: Diagnosis not present

## 2018-04-25 DIAGNOSIS — M25511 Pain in right shoulder: Secondary | ICD-10-CM | POA: Diagnosis not present

## 2018-04-29 DIAGNOSIS — S46011D Strain of muscle(s) and tendon(s) of the rotator cuff of right shoulder, subsequent encounter: Secondary | ICD-10-CM | POA: Diagnosis not present

## 2018-04-29 DIAGNOSIS — M25511 Pain in right shoulder: Secondary | ICD-10-CM | POA: Diagnosis not present

## 2018-04-29 DIAGNOSIS — M6281 Muscle weakness (generalized): Secondary | ICD-10-CM | POA: Diagnosis not present

## 2018-05-01 ENCOUNTER — Telehealth: Payer: Self-pay | Admitting: General Practice

## 2018-05-01 DIAGNOSIS — M25511 Pain in right shoulder: Secondary | ICD-10-CM | POA: Diagnosis not present

## 2018-05-01 DIAGNOSIS — S46011D Strain of muscle(s) and tendon(s) of the rotator cuff of right shoulder, subsequent encounter: Secondary | ICD-10-CM | POA: Diagnosis not present

## 2018-05-01 DIAGNOSIS — M6281 Muscle weakness (generalized): Secondary | ICD-10-CM | POA: Diagnosis not present

## 2018-05-01 NOTE — Telephone Encounter (Signed)
Copied from [place] [id number]. Topic: Inquiry >> May 01, 2018 11:24 AM Amber Roman, NT wrote: Reason for CRM: patient is calling and states that she was told at her sisters appt Amber Roman) that doctor Amber Roman would accept her and her daughter Amber Roman as well as new patients. Please contact to schedule this.    Dr.Crawford are you okay with this?

## 2018-05-02 NOTE — Telephone Encounter (Addendum)
Appointments have been set up for June 07, 2018. - New patient paperwork has been mailed.

## 2018-05-02 NOTE — Telephone Encounter (Signed)
Fine

## 2018-05-03 ENCOUNTER — Telehealth: Payer: Self-pay | Admitting: Internal Medicine

## 2018-05-03 MED ORDER — AZITHROMYCIN 250 MG PO TABS: ORAL_TABLET | ORAL | 0 refills | Status: DC

## 2018-05-03 MED ORDER — PREDNISONE 10 MG PO TABS: ORAL_TABLET | ORAL | 0 refills | Status: DC

## 2018-05-03 NOTE — Telephone Encounter (Signed)
Called and spoke with patient, she states that she is coughing up green mucus, weak, fatigue, trouble breathing. She states this has been going on for 3 days. She tried using alka seltzer as well as vitamin C. She is still using her Anoro inhaler. She denies any fever, chest pain or body aches.    She is leaving for [place] on Monday and is wanting to get this taken care of.    MW please advise as CY is out of the office. Thank you.

## 2018-05-03 NOTE — Telephone Encounter (Signed)
zpak Prednisone 10 mg take  4 each am x 2 days,   2 each am x 2 days,  1 each am x 2 days and stop If not improving go to ER

## 2018-05-03 NOTE — Telephone Encounter (Signed)
Called and spoke to patient. Reviewed with patient MW's recommendations and how to take the medications. Rx sent to preferred pharmacy. Patient thanked staff for the call. Nothing further is needed.

## 2018-05-14 DIAGNOSIS — M25511 Pain in right shoulder: Secondary | ICD-10-CM | POA: Diagnosis not present

## 2018-05-14 DIAGNOSIS — M6281 Muscle weakness (generalized): Secondary | ICD-10-CM | POA: Diagnosis not present

## 2018-05-14 DIAGNOSIS — S46011D Strain of muscle(s) and tendon(s) of the rotator cuff of right shoulder, subsequent encounter: Secondary | ICD-10-CM | POA: Diagnosis not present

## 2018-05-15 DIAGNOSIS — S46011D Strain of muscle(s) and tendon(s) of the rotator cuff of right shoulder, subsequent encounter: Secondary | ICD-10-CM | POA: Diagnosis not present

## 2018-05-16 DIAGNOSIS — S46011D Strain of muscle(s) and tendon(s) of the rotator cuff of right shoulder, subsequent encounter: Secondary | ICD-10-CM | POA: Diagnosis not present

## 2018-05-16 DIAGNOSIS — M25511 Pain in right shoulder: Secondary | ICD-10-CM | POA: Diagnosis not present

## 2018-05-16 DIAGNOSIS — M6281 Muscle weakness (generalized): Secondary | ICD-10-CM | POA: Diagnosis not present

## 2018-05-20 DIAGNOSIS — M6281 Muscle weakness (generalized): Secondary | ICD-10-CM | POA: Diagnosis not present

## 2018-05-20 DIAGNOSIS — S46011D Strain of muscle(s) and tendon(s) of the rotator cuff of right shoulder, subsequent encounter: Secondary | ICD-10-CM | POA: Diagnosis not present

## 2018-05-20 DIAGNOSIS — M25511 Pain in right shoulder: Secondary | ICD-10-CM | POA: Diagnosis not present

## 2018-05-22 DIAGNOSIS — M6281 Muscle weakness (generalized): Secondary | ICD-10-CM | POA: Diagnosis not present

## 2018-05-22 DIAGNOSIS — M25511 Pain in right shoulder: Secondary | ICD-10-CM | POA: Diagnosis not present

## 2018-05-22 DIAGNOSIS — S46011D Strain of muscle(s) and tendon(s) of the rotator cuff of right shoulder, subsequent encounter: Secondary | ICD-10-CM | POA: Diagnosis not present

## 2018-05-27 DIAGNOSIS — S46011D Strain of muscle(s) and tendon(s) of the rotator cuff of right shoulder, subsequent encounter: Secondary | ICD-10-CM | POA: Diagnosis not present

## 2018-05-27 DIAGNOSIS — M6281 Muscle weakness (generalized): Secondary | ICD-10-CM | POA: Diagnosis not present

## 2018-05-27 DIAGNOSIS — M25511 Pain in right shoulder: Secondary | ICD-10-CM | POA: Diagnosis not present

## 2018-05-29 DIAGNOSIS — S46011D Strain of muscle(s) and tendon(s) of the rotator cuff of right shoulder, subsequent encounter: Secondary | ICD-10-CM | POA: Diagnosis not present

## 2018-05-29 DIAGNOSIS — M25511 Pain in right shoulder: Secondary | ICD-10-CM | POA: Diagnosis not present

## 2018-05-29 DIAGNOSIS — M6281 Muscle weakness (generalized): Secondary | ICD-10-CM | POA: Diagnosis not present

## 2018-06-03 DIAGNOSIS — M6281 Muscle weakness (generalized): Secondary | ICD-10-CM | POA: Diagnosis not present

## 2018-06-03 DIAGNOSIS — M25511 Pain in right shoulder: Secondary | ICD-10-CM | POA: Diagnosis not present

## 2018-06-03 DIAGNOSIS — S46011D Strain of muscle(s) and tendon(s) of the rotator cuff of right shoulder, subsequent encounter: Secondary | ICD-10-CM | POA: Diagnosis not present

## 2018-06-05 DIAGNOSIS — M25511 Pain in right shoulder: Secondary | ICD-10-CM | POA: Diagnosis not present

## 2018-06-05 DIAGNOSIS — M6281 Muscle weakness (generalized): Secondary | ICD-10-CM | POA: Diagnosis not present

## 2018-06-05 DIAGNOSIS — S46011D Strain of muscle(s) and tendon(s) of the rotator cuff of right shoulder, subsequent encounter: Secondary | ICD-10-CM | POA: Diagnosis not present

## 2018-06-07 ENCOUNTER — Ambulatory Visit (INDEPENDENT_AMBULATORY_CARE_PROVIDER_SITE_OTHER): Payer: Medicare Other | Admitting: Internal Medicine

## 2018-06-07 ENCOUNTER — Encounter: Payer: Self-pay | Admitting: Internal Medicine

## 2018-06-07 VITALS — BP 122/62 | HR 61 | Temp 97.9°F | Ht 61.5 in | Wt 153.0 lb

## 2018-06-07 DIAGNOSIS — M1610 Unilateral primary osteoarthritis, unspecified hip: Secondary | ICD-10-CM

## 2018-06-07 DIAGNOSIS — Z9989 Dependence on other enabling machines and devices: Secondary | ICD-10-CM

## 2018-06-07 DIAGNOSIS — G4733 Obstructive sleep apnea (adult) (pediatric): Secondary | ICD-10-CM | POA: Diagnosis not present

## 2018-06-07 DIAGNOSIS — I10 Essential (primary) hypertension: Secondary | ICD-10-CM

## 2018-06-07 DIAGNOSIS — F5101 Primary insomnia: Secondary | ICD-10-CM

## 2018-06-07 DIAGNOSIS — E782 Mixed hyperlipidemia: Secondary | ICD-10-CM | POA: Diagnosis not present

## 2018-06-07 DIAGNOSIS — J4530 Mild persistent asthma, uncomplicated: Secondary | ICD-10-CM

## 2018-06-07 NOTE — Assessment & Plan Note (Signed)
BP at goal on irbesartan/hctz 300/12.5 mg. Getting records to check last CMP and order that or adjust if needed.

## 2018-06-07 NOTE — Assessment & Plan Note (Signed)
Getting records, taking crestor 20 mg daily. Will check lipid panel if needed.

## 2018-06-07 NOTE — Assessment & Plan Note (Signed)
Previous OSA and now on CPAP and feeling much improved.

## 2018-06-07 NOTE — Assessment & Plan Note (Signed)
Using meloxicam and gabapentin for pain with good relief.

## 2018-06-07 NOTE — Patient Instructions (Signed)

## 2018-06-07 NOTE — Progress Notes (Signed)
   Subjective:    Patient ID: Amber Roman, female    DOB: 03-12-1946, 72 y.o.   MRN: [id number]  HPI The patient is a new 72 YO female coming in for continuation of her medical problems including bronchitis (takes anoro daily, albuterol if needed, 1 flare in the last year, usually does well, does not feel SOB limiting her life), and her joint pain (takes meloxicam which helps, uses gabapentin for back pain, denies worsening, no falls or injury recently, balance is stable, denies blood in stool or stomach pain), and her cholesterol (taking crestor 20 mg daily, previously lipid panel in the last year with prior PCP, denies side effects from crestor, denies chest pains or stroke symptoms), and her depression (controlled with cymbalta and rare ativan, last filled 2016 from psych, denies current flare or problem with this).   Review of Systems  Constitutional: Negative.   HENT: Negative.   Eyes: Negative.   Respiratory: Negative for cough, chest tightness and shortness of breath.   Cardiovascular: Negative for chest pain, palpitations and leg swelling.  Gastrointestinal: Negative for abdominal distention, abdominal pain, constipation, diarrhea, nausea and vomiting.  Musculoskeletal: Positive for arthralgias and back pain.  Skin: Negative.   Neurological: Negative.   Psychiatric/Behavioral: Negative.       Objective:   Physical Exam  Constitutional: She is oriented to person, place, and time. She appears well-developed and well-nourished.  HENT:  Head: Normocephalic and atraumatic.  Eyes: EOM are normal.  Neck: Normal range of motion.  Cardiovascular: Normal rate and regular rhythm.  Pulmonary/Chest: Effort normal and breath sounds normal. No respiratory distress. She has no wheezes. She has no rales.  Abdominal: Soft. Bowel sounds are normal. She exhibits no distension. There is no tenderness. There is no rebound.  Musculoskeletal: She exhibits no edema.  Neurological: She is alert and  oriented to person, place, and time. Coordination normal.  Skin: Skin is warm and dry.  Psychiatric: She has a normal mood and affect.   Vitals:   06/07/18 1021  BP: 122/62  Pulse: 61  Temp: 97.9 F (36.6 C)  TempSrc: Oral  SpO2: 96%  Weight: 153 lb (69.4 kg)  Height: 5' 1.5" (1.562 m)      Assessment & Plan:

## 2018-06-07 NOTE — Assessment & Plan Note (Signed)
Rare lorazepam usage. Last filled 2016.

## 2018-06-07 NOTE — Assessment & Plan Note (Signed)
Taking anoro daily and albuterol if needed. 1 flare within the last year.

## 2018-06-10 DIAGNOSIS — S46011D Strain of muscle(s) and tendon(s) of the rotator cuff of right shoulder, subsequent encounter: Secondary | ICD-10-CM | POA: Diagnosis not present

## 2018-06-10 DIAGNOSIS — M25511 Pain in right shoulder: Secondary | ICD-10-CM | POA: Diagnosis not present

## 2018-06-10 DIAGNOSIS — M6281 Muscle weakness (generalized): Secondary | ICD-10-CM | POA: Diagnosis not present

## 2018-06-19 ENCOUNTER — Encounter: Payer: Self-pay | Admitting: Internal Medicine

## 2018-06-19 NOTE — Progress Notes (Signed)
Abstracted and sent to scan  

## 2018-06-24 DIAGNOSIS — M25511 Pain in right shoulder: Secondary | ICD-10-CM | POA: Diagnosis not present

## 2018-06-24 DIAGNOSIS — S46011D Strain of muscle(s) and tendon(s) of the rotator cuff of right shoulder, subsequent encounter: Secondary | ICD-10-CM | POA: Diagnosis not present

## 2018-06-24 DIAGNOSIS — M6281 Muscle weakness (generalized): Secondary | ICD-10-CM | POA: Diagnosis not present

## 2018-07-08 ENCOUNTER — Encounter: Payer: Self-pay | Admitting: Internal Medicine

## 2018-07-08 ENCOUNTER — Ambulatory Visit (INDEPENDENT_AMBULATORY_CARE_PROVIDER_SITE_OTHER): Payer: Medicare Other | Admitting: Internal Medicine

## 2018-07-08 VITALS — BP 142/64 | HR 66 | Temp 97.9°F | Resp 16 | Ht 61.5 in | Wt 153.0 lb

## 2018-07-08 DIAGNOSIS — R3989 Other symptoms and signs involving the genitourinary system: Secondary | ICD-10-CM

## 2018-07-08 LAB — POCT URINALYSIS DIPSTICK
BILIRUBIN UA: NEGATIVE
Blood, UA: NEGATIVE
Glucose, UA: NEGATIVE
KETONES UA: NEGATIVE
Leukocytes, UA: NEGATIVE
NITRITE UA: NEGATIVE
PH UA: 6 (ref 5.0–8.0)
Protein, UA: NEGATIVE
SPEC GRAV UA: 1.025 (ref 1.010–1.025)
UROBILINOGEN UA: NEGATIVE U/dL — AB

## 2018-07-08 NOTE — Patient Instructions (Signed)

## 2018-07-08 NOTE — Assessment & Plan Note (Signed)
U/A done in the office which rules out infection. Asked to increase her water intake. Prior imaging 2015 without stones or bladder problem. Given hx smoking if persistent or worsening may need US bladder.

## 2018-07-08 NOTE — Progress Notes (Signed)
   Subjective:    Patient ID: Amber Roman, female    DOB: July 27, 1946, 72 y.o.   MRN: [id number]  HPI The patient is a 72 YO female coming in for possible UTI. Having some pain in the urethral region byb clitoris maybe once a week. Lasts about 1-2 second when present, sharp pain. Started some weeks ago. Overall it is stable. She has also noticed urine is somewhat darker in color which is not usual for her. Stable intake of water. She has increased this some for the summer due to temperatures. Tries not to spend too much time outside. Denies fevers or chills. No vaginal discharge or pain. No weight change.  Review of Systems  Constitutional: Negative.   Respiratory: Negative.   Cardiovascular: Negative.   Gastrointestinal: Positive for abdominal pain. Negative for abdominal distention, constipation, diarrhea, nausea and vomiting.  Genitourinary: Positive for pelvic pain. Negative for decreased urine volume, difficulty urinating, dyspareunia, dysuria, flank pain, frequency, genital sores, urgency, vaginal bleeding, vaginal discharge and vaginal pain.       Dark urine  Musculoskeletal: Negative.   Skin: Negative.       Objective:   Physical Exam  Constitutional: She is oriented to person, place, and time. She appears well-developed and well-nourished.  HENT:  Head: Normocephalic and atraumatic.  Eyes: EOM are normal.  Neck: Normal range of motion.  Cardiovascular: Normal rate and regular rhythm.  Pulmonary/Chest: Effort normal and breath sounds normal. No respiratory distress. She has no wheezes. She has no rales.  Abdominal: Soft. Bowel sounds are normal. She exhibits no distension. There is no tenderness. There is no rebound.  Musculoskeletal: She exhibits no edema.  Neurological: She is alert and oriented to person, place, and time. Coordination normal.  Skin: Skin is warm and dry.   Vitals:   07/08/18 0912  BP: (!) 142/64  Pulse: 66  Resp: 16  Temp: 97.9 F (36.6 C)  TempSrc:  Oral  SpO2: 96%  Weight: 153 lb (69.4 kg)  Height: 5' 1.5" (1.562 m)      Assessment & Plan:

## 2018-07-30 ENCOUNTER — Other Ambulatory Visit: Payer: Self-pay | Admitting: Internal Medicine

## 2018-08-02 ENCOUNTER — Other Ambulatory Visit: Payer: Self-pay | Admitting: Internal Medicine

## 2018-08-02 MED ORDER — IRBESARTAN-HYDROCHLOROTHIAZIDE 300-12.5 MG PO TABS: 1.0000 | ORAL_TABLET | Freq: Every day | ORAL | 1 refills | Status: DC

## 2018-08-02 NOTE — Telephone Encounter (Signed)
Pt.notified

## 2018-08-02 NOTE — Telephone Encounter (Signed)
Irbesartan-HCTZ refill Last Refill:start date 10/12/16 Last OV: 06/07/18 PCP: Sharlet Salina Pharmacy:[facility] [phone]

## 2018-08-02 NOTE — Telephone Encounter (Signed)
Copied from [place] [id number]. Topic: Quick Communication - Rx Refill/Question >> Aug 02, 2018  1:20 PM Burchel, Abbi R wrote: Medication:  irbesartan-hydrochlorothiazide (AVALIDE) 300-12.5 MG tablet    Preferred Pharmacy: [facility] [place], [place] - [address] [place] [address] Phone: [phone] Fax: [phone]    Pt was advised that RX refills may take up to 3 business days.

## 2018-09-03 ENCOUNTER — Other Ambulatory Visit: Payer: Self-pay | Admitting: Internal Medicine

## 2018-09-10 DIAGNOSIS — K5730 Diverticulosis of large intestine without perforation or abscess without bleeding: Secondary | ICD-10-CM | POA: Diagnosis not present

## 2018-09-10 DIAGNOSIS — Z86010 Personal history of colonic polyps: Secondary | ICD-10-CM | POA: Diagnosis not present

## 2018-09-10 DIAGNOSIS — Z1211 Encounter for screening for malignant neoplasm of colon: Secondary | ICD-10-CM | POA: Diagnosis not present

## 2018-09-18 ENCOUNTER — Encounter: Payer: Self-pay | Admitting: Adult Health

## 2018-09-19 ENCOUNTER — Other Ambulatory Visit: Payer: Self-pay | Admitting: Internal Medicine

## 2018-09-23 ENCOUNTER — Encounter: Payer: Self-pay | Admitting: Adult Health

## 2018-09-23 ENCOUNTER — Ambulatory Visit (INDEPENDENT_AMBULATORY_CARE_PROVIDER_SITE_OTHER): Payer: Medicare Other | Admitting: Adult Health

## 2018-09-23 VITALS — BP 131/65 | HR 60 | Ht 61.5 in | Wt 149.0 lb

## 2018-09-23 DIAGNOSIS — Z9989 Dependence on other enabling machines and devices: Secondary | ICD-10-CM

## 2018-09-23 DIAGNOSIS — G4733 Obstructive sleep apnea (adult) (pediatric): Secondary | ICD-10-CM

## 2018-09-23 NOTE — Progress Notes (Signed)
PATIENT: Amber Roman DOB: 1946-08-21  REASON FOR VISIT: follow up HISTORY FROM: patient  HISTORY OF PRESENT ILLNESS: Today 09/23/18:  Ms. January is a 72 year old female with a history of obstructive sleep apnea on CPAP.  She returns today for follow-up.  Her CPAP download indicates that she use her machine 30 out of 30 days for compliance of 100%.  She use her machine greater than 4 hours each night.  On average she uses her machine 8 hours and 41 minutes.  Her residual AHI is 3.5 on 7 cm of water with EPR of 2.  She reports that the CPAP continues to work well for her.  Her Epworth sleepiness score is 0.  She denies any significant changes in her memory.  She states that in December she will be going to [place] for 5 months.  She returns today for evaluation.  HISTORY [name] reports that she is doing well with her CPAP and she is highly compliant, using the CPAP machine 97% of the last 30 days, with an average user time of 8 hours 41 minutes, the machine is set at 7 cm water with 2 cm expiratory pressure relief. A residual AHI is 3.7. They seem to be minimal air leaks. She is followed by advanced home care also try it in [facility]. She reports sometimes that the head gear is wearing out the elastics are no longer as pliable. She has not experienced excessive daytime sleepiness, her Epworth score is endorsed at 2 points she endorses a high degree of fatigue at 51 points and the geriatric depression score was endorsed at 3 out of 15 points not indicative for clinical depression. She had a pulmonary function test Monday 09-17-2017, with Dr Baird Lyons, she had chronic coughing, C X Ray was clear- she suspects an allergic or viral cause. The cough lasted all summer and runny nose was part of it, non febrile but fatigued, loss of apetite. Prednisone finally cleared her wheezing.  She also has a stabile neuropathy and inquired about her chiropractor wanting to treat with laser.  I will  change her to methylated Vit B 12 , and asked her to watch her carbohydrate intake ,she has a higher risk for diabetes   REVIEW OF SYSTEMS: Out of a complete 14 system review of symptoms, the patient complains only of the following symptoms, and all other reviewed systems are negative.  See HPI  ALLERGIES: Allergies  Allergen Reactions  . Sulfonamide Derivatives Other (See Comments)    Childhood allergy     HOME MEDICATIONS: Outpatient Medications Prior to Visit  Medication Sig Dispense Refill  . ANORO ELLIPTA 62.5-25 MCG/INH AEPB INHALE 1 PUFF INTO THE LUNGS DAILY 60 each 2  . aspirin 81 MG tablet Take 81 mg by mouth daily.    . diphenoxylate-atropine (LOMOTIL) 2.5-0.025 MG per tablet Take 1 tablet by mouth 4 (four) times daily as needed. For stomach    . DULoxetine (CYMBALTA) 60 MG capsule Take 1 capsule by mouth daily.  1  . fluticasone (FLONASE) 50 MCG/ACT nasal spray 1-2 puffs each nostril once daily while needed 16 g prn  . gabapentin (NEURONTIN) 300 MG capsule TAKE 1 CAPSULE BY MOUTH TWICE DAILY 180 capsule 0  . guaiFENesin (MUCINEX) 600 MG 12 hr tablet Take 1,200 mg by mouth 2 (two) times daily as needed. For chest tightness    . irbesartan-hydrochlorothiazide (AVALIDE) 300-12.5 MG tablet Take 1 tablet by mouth daily. 90 tablet 1  . LORazepam (  ATIVAN) 0.5 MG tablet Take 0.5 mg by mouth every 8 (eight) hours.    . meloxicam (MOBIC) 15 MG tablet 15 mg daily.  0  . metoprolol succinate (TOPROL-XL) 100 MG 24 hr tablet     . Multiple Vitamin (MULTIVITAMIN) tablet Take 1 tablet by mouth daily.    . Multiple Vitamins-Minerals (PRESERVISION AREDS PO) Take by mouth daily.    . Multiple Vitamins-Minerals (VITAMIN D3 COMPLETE PO) Take 50 mcg by mouth daily.    [name] olopatadine (PATANOL) 0.1 % ophthalmic solution Place 1 drop into both eyes 2 (two) times daily as needed. For allergies 5 mL 3  . OVER THE COUNTER MEDICATION Vitamin B6 100mg  daily in the am    . PROAIR HFA 108 (90 Base)  MCG/ACT inhaler INHALE 2 PUFFS INTO THE LUNGS EVERY 6 HOURS AS NEEDED FOR WHEEZING OR SHORTNESS OF BREATH 8.5 g 0  . rosuvastatin (CRESTOR) 20 MG tablet Take 20 mg by mouth at bedtime.      No facility-administered medications prior to visit.     PAST MEDICAL HISTORY: Past Medical History:  Diagnosis Date  . Allergic conjunctivitis   . Arthritis   . Bronchitis   . Bruxism, sleep-related 04/13/2014  . Depression   . Esophageal reflux   . Family history of heart disease   . H/O abdominal hysterectomy    w/o recurrence  . Hyperlipidemia   . Hypertension   . Insomnia 04/13/2014  . Obstructive sleep apnea    improved on C Pap  . Uterine cancer [facility])    hysterectomy    PAST SURGICAL HISTORY: Past Surgical History:  Procedure Laterality Date  . BILATERAL SALPINGOOPHORECTOMY    . CARPAL TUNNEL RELEASE     bilateral  . DOPPLER ECHOCARDIOGRAPHY    . NASAL SEPTUM SURGERY    . NM MYOVIEW LTD    . ROTATOR CUFF REPAIR Right   . TOTAL ABDOMINAL HYSTERECTOMY    . TOTAL HIP ARTHROPLASTY  01/10/2013   Procedure: TOTAL HIP ARTHROPLASTY ANTERIOR APPROACH;  Surgeon: Mcarthur Rossetti, MD;  Location: [facility];  Service: Orthopedics;  Laterality: Right;    FAMILY HISTORY: Family History  Problem Relation Age of Onset  . Heart attack Father   . High blood pressure Father   . Multiple sclerosis Father   . Lung disease Mother   . Colon cancer Brother   . High blood pressure Brother   . Diabetes Mellitus II Sister   . High blood pressure Sister     SOCIAL HISTORY: Social History   Socioeconomic History  . Marital status: Single    Spouse name: Not on file  . Number of children: 1  . Years of education: College  . Highest education level: Not on file  Occupational History  . Not on file  Social Needs  . Financial resource strain: Not on file  . Food insecurity:    Worry: Not on file    Inability: Not on file  . Transportation needs:    Medical: Not on file    Non-medical:  Not on file  Tobacco Use  . Smoking status: Former Smoker    Packs/day: 2.00    Years: 15.00    Pack years: 30.00    Types: Cigarettes    Last attempt to quit: 12/18/1978    Years since quitting: 39.7  . Smokeless tobacco: Never Used  Substance and Sexual Activity  . Alcohol use: No  . Drug use: No    Comment: hx of  marijuana use   . Sexual activity: Not on file  Lifestyle  . Physical activity:    Days per week: Not on file    Minutes per session: Not on file  . Stress: Not on file  Relationships  . Social connections:    Talks on phone: Not on file    Gets together: Not on file    Attends religious service: Not on file    Active member of club or organization: Not on file    Attends meetings of clubs or organizations: Not on file    Relationship status: Not on file  . Intimate partner violence:    Fear of current or ex partner: Not on file    Emotionally abused: Not on file    Physically abused: Not on file    Forced sexual activity: Not on file  Other Topics Concern  . Not on file  Social History Narrative   Patient is single.   Patient has a college education.   Patient drinks one caffeine drink per day.   Patient is right-handed.   Patient has one child.            PHYSICAL EXAM  Vitals:   09/23/18 0714  BP: 131/65  Pulse: 60  Weight: 149 lb (67.6 kg)  Height: 5' 1.5" (1.562 m)   Body mass index is 27.7 kg/m.  Generalized: Well developed, in no acute distress   Neurological examination  Mentation: Alert oriented to time, place, history taking. Follows all commands speech and language fluent Cranial nerve II-XII:  Extraocular movements were full, visual field were full on confrontational test. Facial sensation and strength were normal. Uvula tongue midline. Head turning and shoulder shrug  were normal and symmetric.  Neck circumference 15-1/2 inches, Mallampati 4+ Motor: The motor testing reveals 5 over 5 strength of all 4 extremities. Good symmetric  motor tone is noted throughout.  Sensory: Sensory testing is intact to soft touch on all 4 extremities. No evidence of extinction is noted.  Gait and station: Gait is normal.    DIAGNOSTIC DATA (LABS, IMAGING, TESTING) - I reviewed patient records, labs, notes, testing and imaging myself where available.  Lab Results  Component Value Date   WBC 10.7 09/19/2017   HGB 13.1 09/19/2017   HCT 40 09/19/2017   MCV 88.0 09/03/2017   PLT 300 09/19/2017      Component Value Date/Time   NA 141 09/19/2017   K 5.7 (A) 09/19/2017   CL 103 01/11/2013 0444   CO2 28 01/11/2013 0444   GLUCOSE 102 (H) 01/11/2013 0444   BUN 22 (A) 09/19/2017   CREATININE 0.9 09/19/2017   CREATININE 0.52 01/11/2013 0444   CALCIUM 8.8 01/11/2013 0444   AST 15 09/19/2017   ALT 21 09/19/2017   ALKPHOS 67 09/19/2017   GFRNONAA >90 01/11/2013 0444   GFRAA >90 01/11/2013 0444   Lab Results  Component Value Date   CHOL 191 09/19/2017   HDL 66 09/19/2017   LDLCALC 90 09/19/2017   TRIG 174 (A) 09/19/2017   Lab Results  Component Value Date   HGBA1C 6.6 09/19/2017   No results found for: VITAMINB12 Lab Results  Component Value Date   TSH 3.59 09/19/2017      ASSESSMENT AND PLAN 72 y.o. year old female  has a past medical history of Allergic conjunctivitis, Arthritis, Bronchitis, Bruxism, sleep-related (04/13/2014), Depression, Esophageal reflux, Family history of heart disease, H/O abdominal hysterectomy, Hyperlipidemia, Hypertension, Insomnia (04/13/2014), Obstructive sleep apnea, and Uterine  cancer ([facility]). here with:  1.  Obstructive sleep apnea on CPAP  The patient CPAP download shows excellent compliance and good treatment of her apnea.  She is encouraged to continue using the CPAP nightly and greater than 4 hours each night.  She is advised that if her symptoms worsen or she develops new symptoms she should let us know.  She will follow-up in 6 months or sooner as needed.  I spent 15 minutes with the  patient. 50% of this time was spent reviewing her CPAP download   Ward Givens, MSN, NP-C 09/23/2018, 7:38 AM [facility] [address] [place], [place] [address] [phone]

## 2018-09-23 NOTE — Patient Instructions (Signed)
Your Plan:  Continue using CPAP nightly If your symptoms worsen or you develop new symptoms please let us know.   Thank you for coming to see us at [facility]. I hope we have been able to provide you high quality care today.  You may receive a patient satisfaction survey over the next few weeks. We would appreciate your feedback and comments so that we may continue to improve ourselves and the health of our patients.  

## 2018-10-15 DIAGNOSIS — Z23 Encounter for immunization: Secondary | ICD-10-CM | POA: Diagnosis not present

## 2018-10-22 ENCOUNTER — Ambulatory Visit (INDEPENDENT_AMBULATORY_CARE_PROVIDER_SITE_OTHER): Payer: Medicare Other

## 2018-10-22 DIAGNOSIS — Z23 Encounter for immunization: Secondary | ICD-10-CM

## 2018-10-22 DIAGNOSIS — Z299 Encounter for prophylactic measures, unspecified: Secondary | ICD-10-CM

## 2018-10-23 DIAGNOSIS — Z961 Presence of intraocular lens: Secondary | ICD-10-CM | POA: Diagnosis not present

## 2018-10-23 DIAGNOSIS — H353132 Nonexudative age-related macular degeneration, bilateral, intermediate dry stage: Secondary | ICD-10-CM | POA: Diagnosis not present

## 2018-10-23 DIAGNOSIS — H26493 Other secondary cataract, bilateral: Secondary | ICD-10-CM | POA: Diagnosis not present

## 2018-10-23 DIAGNOSIS — H524 Presbyopia: Secondary | ICD-10-CM | POA: Diagnosis not present

## 2018-11-01 ENCOUNTER — Encounter: Payer: Self-pay | Admitting: Internal Medicine

## 2018-11-01 DIAGNOSIS — Z86010 Personal history of colonic polyps: Secondary | ICD-10-CM | POA: Diagnosis not present

## 2018-11-01 DIAGNOSIS — D122 Benign neoplasm of ascending colon: Secondary | ICD-10-CM | POA: Diagnosis not present

## 2018-11-01 DIAGNOSIS — K5730 Diverticulosis of large intestine without perforation or abscess without bleeding: Secondary | ICD-10-CM | POA: Diagnosis not present

## 2018-11-01 DIAGNOSIS — K635 Polyp of colon: Secondary | ICD-10-CM | POA: Diagnosis not present

## 2018-11-01 DIAGNOSIS — Z1211 Encounter for screening for malignant neoplasm of colon: Secondary | ICD-10-CM | POA: Diagnosis not present

## 2018-11-01 LAB — HM COLONOSCOPY

## 2018-11-01 NOTE — Progress Notes (Signed)
Abstracted and sent to scan  

## 2018-11-04 DIAGNOSIS — H353112 Nonexudative age-related macular degeneration, right eye, intermediate dry stage: Secondary | ICD-10-CM | POA: Diagnosis not present

## 2018-11-04 DIAGNOSIS — H353121 Nonexudative age-related macular degeneration, left eye, early dry stage: Secondary | ICD-10-CM | POA: Diagnosis not present

## 2018-11-18 DIAGNOSIS — M5136 Other intervertebral disc degeneration, lumbar region: Secondary | ICD-10-CM | POA: Diagnosis not present

## 2018-11-22 ENCOUNTER — Encounter (INDEPENDENT_AMBULATORY_CARE_PROVIDER_SITE_OTHER): Payer: Medicare Other | Admitting: Ophthalmology

## 2018-11-22 DIAGNOSIS — H35033 Hypertensive retinopathy, bilateral: Secondary | ICD-10-CM

## 2018-11-22 DIAGNOSIS — H353132 Nonexudative age-related macular degeneration, bilateral, intermediate dry stage: Secondary | ICD-10-CM | POA: Diagnosis not present

## 2018-11-22 DIAGNOSIS — I10 Essential (primary) hypertension: Secondary | ICD-10-CM | POA: Diagnosis not present

## 2018-11-22 DIAGNOSIS — H43813 Vitreous degeneration, bilateral: Secondary | ICD-10-CM | POA: Diagnosis not present

## 2018-11-25 ENCOUNTER — Other Ambulatory Visit: Payer: Self-pay | Admitting: Internal Medicine

## 2018-11-29 DIAGNOSIS — M5136 Other intervertebral disc degeneration, lumbar region: Secondary | ICD-10-CM | POA: Diagnosis not present

## 2018-12-16 ENCOUNTER — Ambulatory Visit: Payer: Medicare Other | Admitting: Internal Medicine

## 2019-01-01 ENCOUNTER — Telehealth: Payer: Self-pay | Admitting: Internal Medicine

## 2019-01-01 MED ORDER — FLUTICASONE FUROATE-VILANTEROL 100-25 MCG/INH IN AEPB: 1.0000 | INHALATION_SPRAY | Freq: Every day | RESPIRATORY_TRACT | 11 refills | Status: DC

## 2019-01-01 NOTE — Telephone Encounter (Signed)
Called and spoke with patient, she stated that the rx for Anoro is now 450$ patient is requesting to have the Breo called in instead since it is cheaper. CY please advise, thank you.    Current Outpatient Medications on File Prior to Visit  Medication Sig Dispense Refill  . ANORO ELLIPTA 62.5-25 MCG/INH AEPB INHALE 1 PUFF INTO THE LUNGS DAILY 60 each 5  . aspirin 81 MG tablet Take 81 mg by mouth daily.    . diphenoxylate-atropine (LOMOTIL) 2.5-0.025 MG per tablet Take 1 tablet by mouth 4 (four) times daily as needed. For stomach    . DULoxetine (CYMBALTA) 60 MG capsule Take 1 capsule by mouth daily.  1  . fluticasone (FLONASE) 50 MCG/ACT nasal spray 1-2 puffs each nostril once daily while needed 16 g prn  . gabapentin (NEURONTIN) 300 MG capsule TAKE 1 CAPSULE BY MOUTH TWICE DAILY 180 capsule 0  . guaiFENesin (MUCINEX) 600 MG 12 hr tablet Take 1,200 mg by mouth 2 (two) times daily as needed. For chest tightness    . irbesartan-hydrochlorothiazide (AVALIDE) 300-12.5 MG tablet Take 1 tablet by mouth daily. 90 tablet 1  . LORazepam (ATIVAN) 0.5 MG tablet Take 0.5 mg by mouth every 8 (eight) hours.    . meloxicam (MOBIC) 15 MG tablet 15 mg daily.  0  . metoprolol succinate (TOPROL-XL) 100 MG 24 hr tablet     . Multiple Vitamin (MULTIVITAMIN) tablet Take 1 tablet by mouth daily.    . Multiple Vitamins-Minerals (PRESERVISION AREDS PO) Take by mouth daily.    . Multiple Vitamins-Minerals (VITAMIN D3 COMPLETE PO) Take 50 mcg by mouth daily.    [name] olopatadine (PATANOL) 0.1 % ophthalmic solution Place 1 drop into both eyes 2 (two) times daily as needed. For allergies 5 mL 3  . OVER THE COUNTER MEDICATION Vitamin B6 100mg  daily in the am    . PROAIR HFA 108 (90 Base) MCG/ACT inhaler INHALE 2 PUFFS INTO THE LUNGS EVERY 6 HOURS AS NEEDED FOR WHEEZING OR SHORTNESS OF BREATH 8.5 g 0  . rosuvastatin (CRESTOR) 20 MG tablet Take 20 mg by mouth at bedtime.      No current facility-administered medications on file  prior to visit.    Allergies  Allergen Reactions  . Sulfonamide Derivatives Other (See Comments)    Childhood allergy

## 2019-01-01 NOTE — Telephone Encounter (Signed)
Called and spoke with pt letting her know that CY was fine with [place] switching her from Anoro to [place]. Pt expressed understanding. Verified pt's preferred pharmacy and sent Rx in. D/c'd Anoro off pt's med list. Nothing further needed.

## 2019-01-01 NOTE — Telephone Encounter (Signed)
Ok to [employer] and change to Breo 100, 1 puff, then rinse mouth, once daily, ref x 12

## 2019-01-26 ENCOUNTER — Other Ambulatory Visit: Payer: Self-pay | Admitting: Internal Medicine

## 2019-02-27 ENCOUNTER — Telehealth: Payer: Self-pay | Admitting: Internal Medicine

## 2019-02-27 NOTE — Telephone Encounter (Signed)
She would need to be seen to get a neb treatment here. If her regular meds, including use of her rescue inhaler. We can send in a prednisone script if she wants. If she wants a treatment, I can work her in today or tomorrow morning.

## 2019-02-27 NOTE — Telephone Encounter (Signed)
LVM for patient to return call regarding not feeling well today, in need of breathing treatment this afternoon. X1 Leaving message in triage today until we get a hold of the patient.

## 2019-02-27 NOTE — Telephone Encounter (Signed)
Called and spoke with Patient.  Dr Annamaria Boots recommendations given.  Patient stated understanding.   Patient is scheduled for 0930, 02/28/19, with Dr Annamaria Boots.  Patient stated that she will discuss prednisone and a possible Tamiflu prescription to have if needed, at [place].  Nothing further at this time.

## 2019-02-27 NOTE — Telephone Encounter (Signed)
Spoke with pt.  She states that she flew in from [place] yesterday and flights always mess with her breathing.  She now has c/o : Increased wheezing for 2 days, some increased sob, occas non prod cough.  Denies fever or chest discomfort.  PT is requesting a breathing treatment before she drives back to [place]. She has not used her Proair yet.  She is taking Mucinex and Copywriter, advertising as needed. Please advise.  Last ov with Dr Annamaria Boots: 12/04/17 Allergies  Allergen Reactions  . Sulfonamide Derivatives Other (See Comments)    Childhood allergy    Current Outpatient Medications on File Prior to Visit  Medication Sig Dispense Refill  . aspirin 81 MG tablet Take 81 mg by mouth daily.    . diphenoxylate-atropine (LOMOTIL) 2.5-0.025 MG per tablet Take 1 tablet by mouth 4 (four) times daily as needed. For stomach    . DULoxetine (CYMBALTA) 60 MG capsule Take 1 capsule by mouth daily.  1  . fluticasone (FLONASE) 50 MCG/ACT nasal spray 1-2 puffs each nostril once daily while needed 16 g prn  . fluticasone furoate-vilanterol (BREO ELLIPTA) 100-25 MCG/INH AEPB Inhale 1 puff into the lungs daily. 60 each 11  . gabapentin (NEURONTIN) 300 MG capsule TAKE 1 CAPSULE BY MOUTH TWICE DAILY 180 capsule 0  . guaiFENesin (MUCINEX) 600 MG 12 hr tablet Take 1,200 mg by mouth 2 (two) times daily as needed. For chest tightness    . irbesartan-hydrochlorothiazide (AVALIDE) 300-12.5 MG tablet TAKE 1 TABLET BY MOUTH DAILY 90 tablet 1  . LORazepam (ATIVAN) 0.5 MG tablet Take 0.5 mg by mouth every 8 (eight) hours.    . meloxicam (MOBIC) 15 MG tablet 15 mg daily.  0  . metoprolol succinate (TOPROL-XL) 100 MG 24 hr tablet     . Multiple Vitamin (MULTIVITAMIN) tablet Take 1 tablet by mouth daily.    . Multiple Vitamins-Minerals (PRESERVISION AREDS PO) Take by mouth daily.    . Multiple Vitamins-Minerals (VITAMIN D3 COMPLETE PO) Take 50 mcg by mouth daily.    [name] olopatadine (PATANOL) 0.1 % ophthalmic solution Place 1 drop into both  eyes 2 (two) times daily as needed. For allergies 5 mL 3  . OVER THE COUNTER MEDICATION Vitamin B6 100mg  daily in the am    . PROAIR HFA 108 (90 Base) MCG/ACT inhaler INHALE 2 PUFFS INTO THE LUNGS EVERY 6 HOURS AS NEEDED FOR WHEEZING OR SHORTNESS OF BREATH 8.5 g 0  . rosuvastatin (CRESTOR) 20 MG tablet Take 20 mg by mouth at bedtime.      No current facility-administered medications on file prior to visit.

## 2019-02-27 NOTE — Telephone Encounter (Signed)
Pt is calling back [phone]

## 2019-02-28 ENCOUNTER — Encounter: Payer: Self-pay | Admitting: Internal Medicine

## 2019-02-28 ENCOUNTER — Other Ambulatory Visit: Payer: Self-pay

## 2019-02-28 ENCOUNTER — Ambulatory Visit (INDEPENDENT_AMBULATORY_CARE_PROVIDER_SITE_OTHER): Payer: Medicare Other | Admitting: Internal Medicine

## 2019-02-28 VITALS — BP 140/68 | HR 68 | Temp 98.9°F | Ht 61.5 in | Wt 158.2 lb

## 2019-02-28 DIAGNOSIS — J4531 Mild persistent asthma with (acute) exacerbation: Secondary | ICD-10-CM | POA: Diagnosis not present

## 2019-02-28 DIAGNOSIS — J4521 Mild intermittent asthma with (acute) exacerbation: Secondary | ICD-10-CM

## 2019-02-28 DIAGNOSIS — G4733 Obstructive sleep apnea (adult) (pediatric): Secondary | ICD-10-CM

## 2019-02-28 DIAGNOSIS — Z9989 Dependence on other enabling machines and devices: Secondary | ICD-10-CM | POA: Diagnosis not present

## 2019-02-28 MED ORDER — METHYLPREDNISOLONE ACETATE 80 MG/ML IJ SUSP
80.0000 mg | Freq: Once | INTRAMUSCULAR | Status: AC
  Administered 2019-02-28: 80 mg via INTRAMUSCULAR

## 2019-02-28 MED ORDER — AZITHROMYCIN 250 MG PO TABS: ORAL_TABLET | ORAL | 0 refills | Status: DC

## 2019-02-28 MED ORDER — LEVALBUTEROL HCL 0.63 MG/3ML IN NEBU
0.6300 mg | INHALATION_SOLUTION | Freq: Once | RESPIRATORY_TRACT | Status: AC
  Administered 2019-02-28: 0.63 mg via RESPIRATORY_TRACT

## 2019-02-28 NOTE — Progress Notes (Signed)
HPI  female former smoker followed for asthma/bronchitis, rhinitis, allergic conjunctivitis, complicated by OSA ([facility]/ Dr Brett Fairy), GERD PFT 09/17/17-minimal diffusion defect.  FVC 2.50/96%, FEV1 2.13/109%, ratio 0.85, FEF 25-75% 2.63/163%, no response to dilator.  TLC 92%, DLCO 77%.  --------------------------------------------------------------------------- 12/04/17- 73 year old female former smoker followed for asthma/bronchitis, rhinitis, allergic conjunctivitis, complicated by OSA ([facility]), GERD CPAP 7/ managed by Neurology Dr Brett Fairy -Asthma: Pt had PFT 09-17-17; review in greater detail. Pt states she is doing well overall.  Proair, Anoro Has a cabin in [place] and says she breathes better there, planning to spend warm months there but still return to [place] for cold weather and medical visits. "Loves" her current medication.  Never needs rescue inhaler and has no sleep disturbance as long as she continues Anoro once daily. We reviewed her PFT and discussed management.  CXR 09/03/17- IMPRESSION: No active cardiopulmonary disease. PFT 09/17/17-minimal diffusion defect.  FVC 2.50/96%, FEV1 2.13/109%, ratio 0.85, FEF 25-75% 2.63/163%, no response to dilator.  TLC 92%, DLCO 77%.  02/28/2019- 73 year old female former smoker followed for asthma/bronchitis, rhinitis, allergic conjunctivitis, complicated by OSA ([facility]), GERD CPAP 7/ managed by Neurology Dr Brett Fairy Memory Dance 100, Proair hfa, flonase Travel- flew here from [place] 3/11. Called on 3/12 reporting increased wheeze x 2 days, some nonproductive cough. At that call she denied fever. -----pt states has had sore throat, SOB, chest tightness since getting off plane 02/26/2019; started wheezing 4 days ago Says her usual meds work fine. Considers this "typical start of bronchitis for me". No fever, myalgias or GI upset. No recognized sick exposure.  Daughter is driving her back to [place] and she asks note for airline. UTD flu  shot  ROS-see HPI + = positive Constitutional:   No-   weight loss, night sweats, fevers, chills, fatigue, lassitude. HEENT:   No-  headaches, difficulty swallowing, tooth/dental problems, +sore throat,     No- sneezing, itching, ear ache,             nasal congestion, post nasal drip,  CV:  No-   chest pain, orthopnea, PND, swelling in lower extremities, anasarca, dizziness, palpitations Resp: No-   shortness of breath with exertion or at rest.                No- coughing up of blood, + productive cough-none, +nonproductive cough            change in color of mucus.  + Occasional wheezing.   Skin: No-   rash or lesions. GI:  No-   heartburn, indigestion, abdominal pain, nausea, vomiting,  GU:   Neuro-     nothing unusual Psych:  No- change in mood or affect. No depression or anxiety.  No memory loss.  OBJ General- Alert, Oriented, Affect-appropriate, Distress- none acute, + overweight Skin- rash-none, lesions- none, excoriation- none Lymphadenopathy- none Head- atraumatic            Eyes- Gross vision intact, PERRLA, conjunctivae look pale, clear secretions            Ears- Hearing,             Nose- , no-Septal dev, mucus, polyps, erosion, perforation             Throat- Mallampati III , mucosa clear , drainage- none, tonsils- atrophic Neck- flexible , trachea midline, no stridor , thyroid nl, carotid no bruit Chest - symmetrical excursion , unlabored           Heart/CV- RRR , no murmur ,  no gallop  , no rub, nl s1 s2                           - JVD- none , edema- none, stasis changes- none, varices- none           Lung-  wheeze I+E, cough-none, dullness-none, rub- none           Chest wall-  Abd-  Br/ Gen/ Rectal- Not done, not indicated Extrem- cyanosis- none, clubbing, none, atrophy- none, strength- nl Neuro- grossly intact to observation. No nystagmus, and no carotid bruit.

## 2019-02-28 NOTE — Assessment & Plan Note (Signed)
Managed by Neurology

## 2019-02-28 NOTE — Assessment & Plan Note (Signed)
Likely mild non-specific viral triggered exacerbation. Low risk at this time for corona, but discussed. Plan neb xop, depomedrol, conservative measures, hydration and continue current asthma meds. Zpak to hold, letter for airline, caution to self restrict exposure to others.

## 2019-02-28 NOTE — Patient Instructions (Addendum)
Order- neb xop 0.63    Dx exacerbation asthmatic bronchits             Depo 80  Script sent for Zpak to take if needed  Ok to continue your regular inhalers  Stay well-hydrated, avoid crowds and avoid exposing others   Please call if we can help  Ok to keep the April 30 appointment

## 2019-03-25 ENCOUNTER — Telehealth: Payer: Self-pay | Admitting: Internal Medicine

## 2019-03-25 MED ORDER — AMOXICILLIN-POT CLAVULANATE 875-125 MG PO TABS: 1.0000 | ORAL_TABLET | Freq: Two times a day (BID) | ORAL | 0 refills | Status: DC

## 2019-03-25 NOTE — Telephone Encounter (Signed)
Returned call to patient. She would like rx Augmentin sent to her pharmacy. Rx sent Nothing further needed.

## 2019-03-25 NOTE — Telephone Encounter (Signed)
Patient states she is in [place]  And she feel she has been fighting Bronchitis. She has take z-pack  She started this Sunday. She Started coughing up brown and tan mucus. No Fever no sob.  She is Wheezing at this time. She states she started feeling better Tuesday but is back to feeling bad now.   Prefers to use [facility] in [place].  Cy please advise.

## 2019-03-25 NOTE — Telephone Encounter (Signed)
Offer augmentin 875 mg, # 14, 1 twice daily 

## 2019-04-08 ENCOUNTER — Ambulatory Visit: Payer: Medicare Other | Admitting: Cardiovascular Disease

## 2019-04-17 ENCOUNTER — Ambulatory Visit: Payer: Medicare Other | Admitting: Internal Medicine

## 2019-04-22 ENCOUNTER — Telehealth: Payer: Self-pay | Admitting: Internal Medicine

## 2019-04-22 NOTE — Telephone Encounter (Signed)
Copied from [place] [id number]. Topic: Quick Communication - Rx Refill/Question >> Apr 22, 2019  6:50 PM Gustavus Messing wrote: Medication: metoprolol succinate (TOPROL-XL) 100 MG 24 hr tablet [[id number]]  rosuvastatin (CRESTOR) 20 MG tablet [[id number]]   Has the patient contacted their pharmacy? No. (Agent: If no, request that the patient contact the pharmacy for the refill.) The patient is in [place] and needs her medications filled.    Preferred Pharmacy (with phone number or street name): [facility], [place]  Agent: Please be advised that RX refills may take up to 3 business days. We ask that you follow-up with your pharmacy.

## 2019-04-23 NOTE — Telephone Encounter (Signed)
Neither medication has ever been rx'd by Dr. Sharlet Salina. Patient is scheduled 04/24/19 with Dr. Sharlet Salina.

## 2019-04-23 NOTE — Telephone Encounter (Signed)
Called patient, VM box is full, unable to leave message. Please inform patient she needs an appointment if she returns calls.

## 2019-04-23 NOTE — Telephone Encounter (Signed)
Please see if she can do virtual visit. Dr. Sharlet Salina has not seen her since 06/2018 and the 2 requested meds are historical.

## 2019-04-24 ENCOUNTER — Ambulatory Visit (INDEPENDENT_AMBULATORY_CARE_PROVIDER_SITE_OTHER): Payer: Medicare Other | Admitting: Internal Medicine

## 2019-04-24 ENCOUNTER — Encounter: Payer: Self-pay | Admitting: Internal Medicine

## 2019-04-24 DIAGNOSIS — Z0000 Encounter for general adult medical examination without abnormal findings: Secondary | ICD-10-CM

## 2019-04-24 DIAGNOSIS — I10 Essential (primary) hypertension: Secondary | ICD-10-CM | POA: Diagnosis not present

## 2019-04-24 DIAGNOSIS — F5101 Primary insomnia: Secondary | ICD-10-CM | POA: Diagnosis not present

## 2019-04-24 DIAGNOSIS — E782 Mixed hyperlipidemia: Secondary | ICD-10-CM | POA: Diagnosis not present

## 2019-04-24 MED ORDER — ROSUVASTATIN CALCIUM 20 MG PO TABS: 20.0000 mg | ORAL_TABLET | Freq: Every day | ORAL | 3 refills | Status: DC

## 2019-04-24 MED ORDER — METOPROLOL SUCCINATE ER 100 MG PO TB24: 100.0000 mg | ORAL_TABLET | Freq: Every day | ORAL | 3 refills | Status: DC

## 2019-04-24 NOTE — Assessment & Plan Note (Signed)
Using lorazepam and cymbalta. Talked to her about melatonin, chamomile, benadryl as potential options to help.

## 2019-04-24 NOTE — Progress Notes (Signed)
Virtual Visit via Video Note  I connected with Amber Roman on 04/24/19 at 10:20 AM EDT by a video enabled telemedicine application and verified that I am speaking with the correct person using two identifiers.  The patient and the provider were at separate locations throughout the entire encounter.   I discussed the limitations of evaluation and management by telemedicine and the availability of in person appointments. The patient expressed understanding and agreed to proceed.  History of Present Illness: The patient is a 73 y.o. female with visit for follow up of her anxiety (using lorazepam as needed and taking cymbalta daily, is having some more difficulty with sleep given the pandemic, otherwise is managing well, walking dogs daily, denies SI/HI, is satisfied with level of control) and blood pressure (taking her irbesartan/hctz and metoprolol and denies side effects, taking regularly and BP at goal on home monitor, denies chest pains or headaches) and cholesterol (needs refill of crestor, taking daily, denies side effects, denies chest pains or stroke symptoms). [place] is currently in [place] and [place] one [place] or [place] the island for now. Only 2 people on the [place] tested positive and she was not.   Here for medicare wellness, no new complaints. Please see A/P for status and treatment of chronic medical problems.   Diet: heart healthy Physical activity: walks daily Depression/mood screen: negative Hearing: intact to whispered voice Visual acuity: grossly normal, performs annual eye exam  ADLs: capable Fall risk: low Home safety: good Cognitive evaluation: intact to orientation, naming, recall and repetition EOL planning: adv directives discussed    Office Visit from 06/07/2018 in [place]  PHQ-2 Total Score  0      I have personally reviewed and have noted 1. The patient's medical and social history -  reviewed today no changes 2. Their use of alcohol, tobacco or illicit drugs 3. Their current medications and supplements 4. The patient's functional ability including ADL's, fall risks, home safety risks and hearing or visual impairment. 5. Diet and physical activities 6. Evidence for depression or mood disorders 7. Care team reviewed and updated (available in snapshot)  Observations/Objective: Appearance: normal, breathing appears normal, casual grooming, abdomen does not appear distended, throat normal, memory normal, mental status is A and O times 3  Assessment and Plan: See problem oriented charting  Follow Up Instructions: labs when able, given pandemic will wait  I discussed the assessment and treatment plan with the patient. The patient was provided an opportunity to ask questions and all were answered. The patient agreed with the plan and demonstrated an understanding of the instructions.   The patient was advised to call back or seek an in-person evaluation if the symptoms worsen or if the condition fails to improve as anticipated.  Hoyt Koch, MD

## 2019-04-24 NOTE — Assessment & Plan Note (Signed)
Checking lipid panel when able. Taking crestor 20 mg daily and refilled today.

## 2019-04-24 NOTE — Assessment & Plan Note (Signed)
BP at goal on metoprolol and irbesartan/hctz. Will refill and check labs when able and back in [place]. BP at goal with home readings and she knows to call if this changes.

## 2019-04-24 NOTE — Assessment & Plan Note (Signed)
Flu shot counseled. Pneumonia up to date. Shingrix counseled. Tetanus up to date. Colonoscopy up to date. Mammogram counseled, pap smear aged out and dexa up to date. Counseled about sun safety and mole surveillance. Counseled about the dangers of distracted driving. Given 10 year screening recommendations.

## 2019-05-05 ENCOUNTER — Other Ambulatory Visit: Payer: Self-pay | Admitting: Internal Medicine

## 2019-05-06 DIAGNOSIS — H52203 Unspecified astigmatism, bilateral: Secondary | ICD-10-CM | POA: Diagnosis not present

## 2019-05-06 DIAGNOSIS — Z961 Presence of intraocular lens: Secondary | ICD-10-CM | POA: Diagnosis not present

## 2019-05-06 DIAGNOSIS — H353132 Nonexudative age-related macular degeneration, bilateral, intermediate dry stage: Secondary | ICD-10-CM | POA: Diagnosis not present

## 2019-05-13 DIAGNOSIS — M1712 Unilateral primary osteoarthritis, left knee: Secondary | ICD-10-CM | POA: Diagnosis not present

## 2019-05-13 DIAGNOSIS — M19021 Primary osteoarthritis, right elbow: Secondary | ICD-10-CM | POA: Diagnosis not present

## 2019-05-13 DIAGNOSIS — M19011 Primary osteoarthritis, right shoulder: Secondary | ICD-10-CM | POA: Diagnosis not present

## 2019-05-16 DIAGNOSIS — M5136 Other intervertebral disc degeneration, lumbar region: Secondary | ICD-10-CM | POA: Diagnosis not present

## 2019-05-16 DIAGNOSIS — M5416 Radiculopathy, lumbar region: Secondary | ICD-10-CM | POA: Diagnosis not present

## 2019-05-18 ENCOUNTER — Other Ambulatory Visit: Payer: Self-pay | Admitting: Internal Medicine

## 2019-06-30 DIAGNOSIS — M5416 Radiculopathy, lumbar region: Secondary | ICD-10-CM | POA: Diagnosis not present

## 2019-06-30 DIAGNOSIS — M545 Low back pain: Secondary | ICD-10-CM | POA: Diagnosis not present

## 2019-06-30 DIAGNOSIS — M5136 Other intervertebral disc degeneration, lumbar region: Secondary | ICD-10-CM | POA: Diagnosis not present

## 2019-07-21 DIAGNOSIS — M5416 Radiculopathy, lumbar region: Secondary | ICD-10-CM | POA: Diagnosis not present

## 2019-07-21 DIAGNOSIS — M545 Low back pain: Secondary | ICD-10-CM | POA: Diagnosis not present

## 2019-07-21 DIAGNOSIS — M5136 Other intervertebral disc degeneration, lumbar region: Secondary | ICD-10-CM | POA: Diagnosis not present

## 2019-07-22 ENCOUNTER — Ambulatory Visit (INDEPENDENT_AMBULATORY_CARE_PROVIDER_SITE_OTHER): Payer: Medicare Other | Admitting: Cardiovascular Disease

## 2019-07-22 ENCOUNTER — Other Ambulatory Visit: Payer: Self-pay

## 2019-07-22 ENCOUNTER — Encounter: Payer: Self-pay | Admitting: Cardiovascular Disease

## 2019-07-22 DIAGNOSIS — Z9989 Dependence on other enabling machines and devices: Secondary | ICD-10-CM | POA: Diagnosis not present

## 2019-07-22 DIAGNOSIS — I739 Peripheral vascular disease, unspecified: Secondary | ICD-10-CM

## 2019-07-22 DIAGNOSIS — E782 Mixed hyperlipidemia: Secondary | ICD-10-CM | POA: Diagnosis not present

## 2019-07-22 DIAGNOSIS — I10 Essential (primary) hypertension: Secondary | ICD-10-CM | POA: Diagnosis not present

## 2019-07-22 DIAGNOSIS — G4733 Obstructive sleep apnea (adult) (pediatric): Secondary | ICD-10-CM

## 2019-07-22 NOTE — Assessment & Plan Note (Signed)
History of obstructive sleep apnea on CPAP which she benefits from 

## 2019-07-22 NOTE — Patient Instructions (Signed)
Medication Instructions:  Your physician recommends that you continue on your current medications as directed. Please refer to the Current Medication list given to you today.  If you need a refill on your cardiac medications before your next appointment, please call your pharmacy.   Lab work: Your physician recommends that you return for lab work TODAY: LIPID AND LIVER PANELS  If you have labs (blood work) drawn today and your tests are completely normal, you will receive your results only by: [name] MyChart Message (if you have MyChart) OR . A paper copy in the mail If you have any lab test that is abnormal or we need to change your treatment, we will call you to review the results.  Testing/Procedures: NONE  Follow-Up: At [facility], you and your health needs are our priority.  As part of our continuing mission to provide you with exceptional heart care, we have created designated Provider Care Teams.  These Care Teams include your primary Cardiologist (physician) and Advanced Practice Providers (APPs -  Physician Assistants and Nurse Practitioners) who all work together to provide you with the care you need, when you need it. You will need a follow up appointment in 31 months WITH DR.BERRY.  Please call our office 2 months in advance to schedule this appointment.

## 2019-07-22 NOTE — Assessment & Plan Note (Signed)
History of essential hypertension blood pressure measured today at 138/70.  She is on Toprol.

## 2019-07-22 NOTE — Assessment & Plan Note (Signed)
History of claudication symptoms in the past with normal ABIs.

## 2019-07-22 NOTE — Progress Notes (Signed)
07/22/2019 Amber Roman   06/04/1946  [id number]  Primary Physician Hoyt Koch, MD Primary Cardiologist: Lorretta Harp MD Lupe Carney, [place]  HPI:  Amber Roman is a 73 y.o.  fit-appearing, divorced Caucasian female, mother of one70year-old [place] adopted daughter named [name] who she adopted when she was 73 years old andhas graduated from [employer]. She currently lives in [place]... I last saw her in the office 12/04/2017. She has a history of hypertension, hyperlipidemia, and a strong family history for heart disease with a father who had his first MI at age 27 and died 62 years later of a massive myocardial infarction. Her last Myoview stress test performed 10/25/12 was not ischemic.She has never had a heart attack or stroke, otherwise is asymptomatic. Her other problems include uterine cancer having undergone a total abdominal hysterectomy without recurrence 12 years ago. Dr. Dagmar Hait follows her lipid profile. She had an uncomplicated total hip replacement by Dr. Zollie Beckers 01/09/2013. Since I saw her back she denies chest pain but does complain of occasional shortness of breath on exertion. She had negative breathing studies by Dr. Annamaria Boots recently.  She was diagnosed obstructive sleep apnea by Dr. Fernande Bras and has been a remarkable responder to CPAP. Since I saw her a year and a half ago, she remains stable.  She was complaining of claudication at that time but had Dopplers done since they were normal.  She denies chest pain or shortness of breath.    Current Meds  Medication Sig  . ANORO ELLIPTA 62.5-25 MCG/INH AEPB INHALE 1 PUFF INTO THE LUNGS DAILY  . aspirin 81 MG tablet Take 81 mg by mouth daily.  . diphenoxylate-atropine (LOMOTIL) 2.5-0.025 MG per tablet Take 1 tablet by mouth 4 (four) times daily as needed. For stomach  . DULoxetine (CYMBALTA) 60 MG capsule Take 1 capsule by mouth daily.  . fluticasone (FLONASE) 50 MCG/ACT nasal spray 1-2 puffs each  nostril once daily while needed  . fluticasone furoate-vilanterol (BREO ELLIPTA) 100-25 MCG/INH AEPB Inhale 1 puff into the lungs daily.  [name] gabapentin (NEURONTIN) 300 MG capsule TAKE 1 CAPSULE BY MOUTH TWICE DAILY  . guaiFENesin (MUCINEX) 600 MG 12 hr tablet Take 1,200 mg by mouth 2 (two) times daily as needed. For chest tightness  . irbesartan-hydrochlorothiazide (AVALIDE) 300-12.5 MG tablet TAKE 1 TABLET BY MOUTH DAILY  . LORazepam (ATIVAN) 0.5 MG tablet Take 0.5 mg by mouth every 8 (eight) hours.  . meloxicam (MOBIC) 15 MG tablet 15 mg daily.  . metoprolol succinate (TOPROL-XL) 100 MG 24 hr tablet Take 1 tablet (100 mg total) by mouth daily.  . Multiple Vitamin (MULTIVITAMIN) tablet Take 1 tablet by mouth daily.  . Multiple Vitamins-Minerals (PRESERVISION AREDS PO) Take by mouth daily.  . Multiple Vitamins-Minerals (VITAMIN D3 COMPLETE PO) Take 50 mcg by mouth daily.  [name] olopatadine (PATANOL) 0.1 % ophthalmic solution Place 1 drop into both eyes 2 (two) times daily as needed. For allergies  . OVER THE COUNTER MEDICATION Vitamin B6 100mg  daily in the am  . PROAIR HFA 108 (90 Base) MCG/ACT inhaler INHALE 2 PUFFS INTO THE LUNGS EVERY 6 HOURS AS NEEDED FOR WHEEZING OR SHORTNESS OF BREATH  . rosuvastatin (CRESTOR) 20 MG tablet Take 1 tablet (20 mg total) by mouth at bedtime.     Allergies  Allergen Reactions  . Sulfonamide Derivatives Other (See Comments)    Childhood allergy     Social History   Socioeconomic History  . Marital status:  Single    Spouse name: Not on file  . Number of children: 1  . Years of education: College  . Highest education level: Not on file  Occupational History  . Not on file  Social Needs  . Financial resource strain: Not on file  . Food insecurity    Worry: Not on file    Inability: Not on file  . Transportation needs    Medical: Not on file    Non-medical: Not on file  Tobacco Use  . Smoking status: Former Smoker    Packs/day: 2.00    Years: 15.00     Pack years: 30.00    Types: Cigarettes    Quit date: 12/18/1978    Years since quitting: 40.6  . Smokeless tobacco: Never Used  Substance and Sexual Activity  . Alcohol use: No  . Drug use: No    Comment: hx of marijuana use   . Sexual activity: Not on file  Lifestyle  . Physical activity    Days per week: Not on file    Minutes per session: Not on file  . Stress: Not on file  Relationships  . Social Herbalist on phone: Not on file    Gets together: Not on file    Attends religious service: Not on file    Active member of club or organization: Not on file    Attends meetings of clubs or organizations: Not on file    Relationship status: Not on file  . Intimate partner violence    Fear of current or ex partner: Not on file    Emotionally abused: Not on file    Physically abused: Not on file    Forced sexual activity: Not on file  Other Topics Concern  . Not on file  Social History Narrative   Patient is single.   Patient has a college education.   Patient drinks one caffeine drink per day.   Patient is right-handed.   Patient has one child.           Review of Systems: General: negative for chills, fever, night sweats or weight changes.  Cardiovascular: negative for chest pain, dyspnea on exertion, edema, orthopnea, palpitations, paroxysmal nocturnal dyspnea or shortness of breath Dermatological: negative for rash Respiratory: negative for cough or wheezing Urologic: negative for hematuria Abdominal: negative for nausea, vomiting, diarrhea, bright red blood per rectum, melena, or hematemesis Neurologic: negative for visual changes, syncope, or dizziness All other systems reviewed and are otherwise negative except as noted above.    Blood pressure 138/70, pulse 81, temperature (!) 97.5 F (36.4 C), height 5' 1.5" (1.562 m), weight 158 lb 3.2 oz (71.8 kg), SpO2 98 %.  General appearance: alert and no distress Neck: no adenopathy, no carotid bruit, no  JVD, supple, symmetrical, trachea midline and thyroid not enlarged, symmetric, no tenderness/mass/nodules Lungs: clear to auscultation bilaterally Heart: regular rate and rhythm, S1, S2 normal, no murmur, click, rub or gallop and normal apical impulse  EKG sinus rhythm at 72 without ST or T wave changes.  I personally reviewed this EKG.  ASSESSMENT AND PLAN:   Hyperlipidemia History of hyperlipidemia on statin therapy with lipid profile performed 09/19/2017 revealing total cholesterol 191, LDL of 90 and HDL 66.  I am going to obtain a fasting lipid liver profile today.  Essential hypertension History of essential hypertension blood pressure measured today at 138/70.  She is on Toprol.  OSA on CPAP History of obstructive sleep apnea  on CPAP which she benefits from.  Claudication in peripheral vascular disease ([place]) History of claudication symptoms in the past with normal ABIs.      Lorretta Harp MD FACP,FACC,FAHA, [facility] 07/22/2019 11:18 AM

## 2019-07-22 NOTE — Assessment & Plan Note (Signed)
History of hyperlipidemia on statin therapy with lipid profile performed 09/19/2017 revealing total cholesterol 191, LDL of 90 and HDL 66.  I am going to obtain a fasting lipid liver profile today.

## 2019-07-23 LAB — HEPATIC FUNCTION PANEL
ALT: 34 [IU]/L — ABNORMAL HIGH (ref 0–32)
AST: 24 [IU]/L (ref 0–40)
Albumin: 5 g/dL — ABNORMAL HIGH (ref 3.7–4.7)
Alkaline Phosphatase: 85 [IU]/L (ref 39–117)
Bilirubin Total: 0.3 mg/dL (ref 0.0–1.2)
Bilirubin, Direct: 0.1 mg/dL (ref 0.00–0.40)
Total Protein: 7.1 g/dL (ref 6.0–8.5)

## 2019-07-23 LAB — LIPID PANEL
Chol/HDL Ratio: 3.1 ratio (ref 0.0–4.4)
Cholesterol, Total: 148 mg/dL (ref 100–199)
HDL: 47 mg/dL
LDL Calculated: 81 mg/dL (ref 0–99)
Triglycerides: 102 mg/dL (ref 0–149)
VLDL Cholesterol Cal: 20 mg/dL (ref 5–40)

## 2019-07-24 ENCOUNTER — Encounter: Payer: Self-pay | Admitting: *Deleted

## 2019-07-24 ENCOUNTER — Other Ambulatory Visit: Payer: Self-pay | Admitting: Internal Medicine

## 2019-08-09 ENCOUNTER — Other Ambulatory Visit: Payer: Self-pay | Admitting: Internal Medicine

## 2019-08-14 DIAGNOSIS — M5136 Other intervertebral disc degeneration, lumbar region: Secondary | ICD-10-CM | POA: Diagnosis not present

## 2019-08-14 DIAGNOSIS — M5416 Radiculopathy, lumbar region: Secondary | ICD-10-CM | POA: Diagnosis not present

## 2019-08-14 DIAGNOSIS — M545 Low back pain: Secondary | ICD-10-CM | POA: Diagnosis not present

## 2019-09-12 ENCOUNTER — Telehealth: Payer: Self-pay | Admitting: Internal Medicine

## 2019-09-12 MED ORDER — OLOPATADINE HCL 0.1 % OP SOLN: 1.0000 [drp] | Freq: Two times a day (BID) | OPHTHALMIC | 3 refills | Status: DC | PRN

## 2019-09-12 NOTE — Telephone Encounter (Signed)
Patanol eye drops refilled

## 2019-09-12 NOTE — Telephone Encounter (Signed)
Message routed to Dr. Annamaria Boots for review and approval:  The Patanol 0.1% was last issued 04/30/2013 with 3 refills.   The patient was last seen by you on  02/28/19 for asthmatic bronchitis, based on this and the note below please advise if this patient can get a refill of the eye drops, if appropriate. Thank you.  "Managed by Neurology  Likely mild non-specific viral triggered exacerbation. Low risk at this time for corona, but discussed. Plan neb xop, depomedrol, conservative measures, hydration and continue current asthma meds. Zpak to hold, letter for airline, caution to self restrict exposure to others  Order- neb xop 0.63    Dx exacerbation asthmatic bronchits  Depo 80  Script sent for Zpak to take if needed  Ok to continue your regular inhalers  Stay well-hydrated, avoid crowds and avoid exposing others   Please call if we can help  Ok to keep the April 30 appointment"

## 2019-09-15 NOTE — Telephone Encounter (Signed)
Called and spoke with pt to let her know her Patanol eye drops have been refilled to her preferred pharmacy. Pt verbalized understanding with no additional questions. Nothing further needed at this time.

## 2019-09-17 ENCOUNTER — Telehealth: Payer: Self-pay | Admitting: Internal Medicine

## 2019-09-17 MED ORDER — AZELASTINE HCL 0.05 % OP SOLN: 1.0000 [drp] | Freq: Two times a day (BID) | OPHTHALMIC | 3 refills | Status: AC

## 2019-09-17 NOTE — Telephone Encounter (Signed)
Received a Drug Change Request from pt's pharmacy, [facility], regarding pt's opth solution olopatadine 0.1% stating [employer] will not cover this medication. CY suggested a change in therapy to azelastine opth drops 6 mL refill PRN 1 drop each eye twice daily.   Called and spoke w/ pt regarding this information and CY's suggestion. Pt verbalized understanding and agreed to the therapy change. Order for azelastine has been placed. Nothing further needed at this time.

## 2019-09-19 ENCOUNTER — Other Ambulatory Visit: Payer: Self-pay

## 2019-09-19 ENCOUNTER — Ambulatory Visit (INDEPENDENT_AMBULATORY_CARE_PROVIDER_SITE_OTHER): Payer: Medicare Other

## 2019-09-19 DIAGNOSIS — Z23 Encounter for immunization: Secondary | ICD-10-CM | POA: Diagnosis not present

## 2019-09-29 ENCOUNTER — Ambulatory Visit: Payer: Medicare Other | Admitting: Adult Health

## 2019-11-11 ENCOUNTER — Encounter: Payer: Self-pay | Admitting: Neurology

## 2019-11-14 ENCOUNTER — Other Ambulatory Visit: Payer: Self-pay | Admitting: Internal Medicine

## 2019-11-17 ENCOUNTER — Other Ambulatory Visit: Payer: Self-pay

## 2019-11-17 ENCOUNTER — Ambulatory Visit (INDEPENDENT_AMBULATORY_CARE_PROVIDER_SITE_OTHER): Payer: Medicare Other | Admitting: Adult Health

## 2019-11-17 ENCOUNTER — Encounter: Payer: Self-pay | Admitting: Adult Health

## 2019-11-17 VITALS — BP 132/62 | HR 58 | Temp 96.9°F | Ht 61.0 in | Wt 159.2 lb

## 2019-11-17 DIAGNOSIS — Z9989 Dependence on other enabling machines and devices: Secondary | ICD-10-CM

## 2019-11-17 DIAGNOSIS — G629 Polyneuropathy, unspecified: Secondary | ICD-10-CM | POA: Diagnosis not present

## 2019-11-17 DIAGNOSIS — G4700 Insomnia, unspecified: Secondary | ICD-10-CM

## 2019-11-17 DIAGNOSIS — G4733 Obstructive sleep apnea (adult) (pediatric): Secondary | ICD-10-CM

## 2019-11-17 NOTE — Progress Notes (Signed)
PATIENT: Amber Roman DOB: 02-19-1946  REASON FOR VISIT: follow up HISTORY FROM: patient  HISTORY OF PRESENT ILLNESS: Today 11/17/19:  Amber Roman is a 73 year old female with a history of obstructive sleep apnea on CPAP.  Her download indicates that she used her machine 30 out of 30 days for compliance of 100%.  She used her machine greater than 4 hours each night.  On average she uses her machine 9 hours and 25 minutes.  Her residual AHI is 3.5 on 7 cm of water with EPR of 2.  Her leak in the 95th percentile is 5.3 L/min.  She reports that the CPAP is working well for her.  She reports there are some nights she has trouble falling asleep.  She states that maybe 2 or 3 AM before she goes to sleep.  She states that she typically watches her tablet before bedtime.  She does not drink caffeine after lunchtime.  She does not have a regular sleep routine.  She continues to have numbness in the right foot and primarily the toes.  She states that she has started to develop numbness in the left foot primarily in the toes.  She denies any changes with her gait or balance.  Denies any falls.  Her last hemoglobin A1c was 6.6.  She has had never had nerve conduction studies.  She denies any pain in the feet.  She denies any changes with her memory.  She plans to go to [place] for 6 months in December.  She returns today for an evaluation.  HISTORY 09/23/18:  Amber Roman is a 73 year old female with a history of obstructive sleep apnea on CPAP.  She returns today for follow-up.  Her CPAP download indicates that she use her machine 30 out of 30 days for compliance of 100%.  She use her machine greater than 4 hours each night.  On average she uses her machine 8 hours and 41 minutes.  Her residual AHI is 3.5 on 7 cm of water with EPR of 2.  She reports that the CPAP continues to work well for her.  Her Epworth sleepiness score is 0.  She denies any significant changes in her memory.  She states that in December  she will be going to [place] for 5 months.  She returns today for evaluation.  REVIEW OF SYSTEMS: Out of a complete 14 system review of symptoms, the patient complains only of the following symptoms, and all other reviewed systems are negative.  ESS 1 FSS 12   ALLERGIES: Allergies  Allergen Reactions  . Sulfonamide Derivatives Other (See Comments)    Childhood allergy     HOME MEDICATIONS: Outpatient Medications Prior to Visit  Medication Sig Dispense Refill  . ANORO ELLIPTA 62.5-25 MCG/INH AEPB INHALE 1 PUFF INTO THE LUNGS DAILY 60 each 5  . azelastine (OPTIVAR) 0.05 % ophthalmic solution Place 1 drop into both eyes 2 (two) times daily. 6 mL 3  . diphenoxylate-atropine (LOMOTIL) 2.5-0.025 MG per tablet Take 1 tablet by mouth 4 (four) times daily as needed. For stomach    . DULoxetine (CYMBALTA) 60 MG capsule Take 1 capsule by mouth daily.  1  . fluticasone (FLONASE) 50 MCG/ACT nasal spray 1-2 puffs each nostril once daily while needed 16 g prn  . fluticasone furoate-vilanterol (BREO ELLIPTA) 100-25 MCG/INH AEPB Inhale 1 puff into the lungs daily. 60 each 11  . gabapentin (NEURONTIN) 300 MG capsule TAKE 1 CAPSULE BY MOUTH TWICE DAILY 180 capsule 0  .  guaiFENesin (MUCINEX) 600 MG 12 hr tablet Take 1,200 mg by mouth 2 (two) times daily as needed. For chest tightness    . irbesartan-hydrochlorothiazide (AVALIDE) 300-12.5 MG tablet TAKE 1 TABLET BY MOUTH DAILY 90 tablet 2  . LORazepam (ATIVAN) 0.5 MG tablet Take 0.5 mg by mouth every 8 (eight) hours.    . meloxicam (MOBIC) 15 MG tablet 15 mg daily.  0  . metoprolol succinate (TOPROL-XL) 100 MG 24 hr tablet Take 1 tablet (100 mg total) by mouth daily. 90 tablet 3  . Multiple Vitamin (MULTIVITAMIN) tablet Take 1 tablet by mouth daily.    . Multiple Vitamins-Minerals (PRESERVISION AREDS PO) Take by mouth daily.    . Multiple Vitamins-Minerals (VITAMIN D3 COMPLETE PO) Take 50 mcg by mouth daily.    [name] OVER THE COUNTER MEDICATION Vitamin B6  100mg  daily in the am    . PROAIR HFA 108 (90 Base) MCG/ACT inhaler INHALE 2 PUFFS INTO THE LUNGS EVERY 6 HOURS AS NEEDED FOR WHEEZING OR SHORTNESS OF BREATH 8.5 g 0  . rosuvastatin (CRESTOR) 20 MG tablet Take 1 tablet (20 mg total) by mouth at bedtime. 90 tablet 3  . aspirin 81 MG tablet Take 81 mg by mouth daily.     No facility-administered medications prior to visit.     PAST MEDICAL HISTORY: Past Medical History:  Diagnosis Date  . Allergic conjunctivitis   . Arthritis   . Bronchitis   . Bruxism, sleep-related 04/13/2014  . Depression   . Esophageal reflux   . Family history of heart disease   . H/O abdominal hysterectomy    w/o recurrence  . Hyperlipidemia   . Hypertension   . Insomnia 04/13/2014  . Obstructive sleep apnea    improved on C Pap  . Uterine cancer [facility])    hysterectomy    PAST SURGICAL HISTORY: Past Surgical History:  Procedure Laterality Date  . BILATERAL SALPINGOOPHORECTOMY    . CARPAL TUNNEL RELEASE     bilateral  . DOPPLER ECHOCARDIOGRAPHY    . NASAL SEPTUM SURGERY    . NM MYOVIEW LTD    . ROTATOR CUFF REPAIR Right   . TOTAL ABDOMINAL HYSTERECTOMY    . TOTAL HIP ARTHROPLASTY  01/10/2013   Procedure: TOTAL HIP ARTHROPLASTY ANTERIOR APPROACH;  Surgeon: Mcarthur Rossetti, MD;  Location: [facility];  Service: Orthopedics;  Laterality: Right;    FAMILY HISTORY: Family History  Problem Relation Age of Onset  . Heart attack Father   . High blood pressure Father   . Multiple sclerosis Father   . Lung disease Mother   . Colon cancer Brother   . High blood pressure Brother   . Diabetes Mellitus II Sister   . High blood pressure Sister     SOCIAL HISTORY: Social History   Socioeconomic History  . Marital status: Single    Spouse name: Not on file  . Number of children: 1  . Years of education: College  . Highest education level: Not on file  Occupational History  . Not on file  Social Needs  . Financial resource strain: Not on file  .  Food insecurity    Worry: Not on file    Inability: Not on file  . Transportation needs    Medical: Not on file    Non-medical: Not on file  Tobacco Use  . Smoking status: Former Smoker    Packs/day: 2.00    Years: 15.00    Pack years: 30.00    Types:  Cigarettes    Quit date: 12/18/1978    Years since quitting: 40.9  . Smokeless tobacco: Never Used  Substance and Sexual Activity  . Alcohol use: No  . Drug use: No    Comment: hx of marijuana use   . Sexual activity: Not on file  Lifestyle  . Physical activity    Days per week: Not on file    Minutes per session: Not on file  . Stress: Not on file  Relationships  . Social Herbalist on phone: Not on file    Gets together: Not on file    Attends religious service: Not on file    Active member of club or organization: Not on file    Attends meetings of clubs or organizations: Not on file    Relationship status: Not on file  . Intimate partner violence    Fear of current or ex partner: Not on file    Emotionally abused: Not on file    Physically abused: Not on file    Forced sexual activity: Not on file  Other Topics Concern  . Not on file  Social History Narrative   Patient is single.   Patient has a college education.   Patient drinks one caffeine drink per day.   Patient is right-handed.   Patient has one child.            PHYSICAL EXAM  Vitals:   11/17/19 0757  BP: 132/62  Pulse: (!) 58  Temp: (!) 96.9 F (36.1 C)  Weight: 159 lb 3.2 oz (72.2 kg)  Height: 5\' 1"  (1.549 m)   Generalized: Well developed, in no acute distress  Chest: Lungs clear to auscultation bilaterally  Neurological examination  Mentation: Alert oriented to time, place, history taking. Follows all commands speech and language fluent Cranial nerve II-XII: Extraocular movements were full, visual field were full on confrontational test Head turning and shoulder shrug  were normal and symmetric. Motor: The motor testing reveals  5 over 5 strength of all 4 extremities. Good symmetric motor tone is noted throughout.  Sensory: Sensory testing is intact to soft touch on all 4 extremities.  Pinprick sensation decreased in the lower extremities primarily in the toes on the right.  On the left it extends to the midfoot.  Vibration sensation intact. Gait and station: Gait is normal.    DIAGNOSTIC DATA (LABS, IMAGING, TESTING) - I reviewed patient records, labs, notes, testing and imaging myself where available.  Lab Results  Component Value Date   WBC 10.7 09/19/2017   HGB 13.1 09/19/2017   HCT 40 09/19/2017   MCV 88.0 09/03/2017   PLT 300 09/19/2017      Component Value Date/Time   NA 141 09/19/2017   K 5.7 (A) 09/19/2017   CL 103 01/11/2013 0444   CO2 28 01/11/2013 0444   GLUCOSE 102 (H) 01/11/2013 0444   BUN 22 (A) 09/19/2017   CREATININE 0.9 09/19/2017   CREATININE 0.52 01/11/2013 0444   CALCIUM 8.8 01/11/2013 0444   PROT 7.1 07/22/2019 1158   ALBUMIN 5.0 (H) 07/22/2019 1158   AST 24 07/22/2019 1158   ALT 34 (H) 07/22/2019 1158   ALKPHOS 85 07/22/2019 1158   BILITOT 0.3 07/22/2019 1158   GFRNONAA >90 01/11/2013 0444   GFRAA >90 01/11/2013 0444   Lab Results  Component Value Date   CHOL 148 07/22/2019   HDL 47 07/22/2019   LDLCALC 81 07/22/2019   TRIG 102 07/22/2019  CHOLHDL 3.1 07/22/2019   Lab Results  Component Value Date   HGBA1C 6.6 09/19/2017   No results found for: VITAMINB12 Lab Results  Component Value Date   TSH 3.59 09/19/2017      ASSESSMENT AND PLAN 73 y.o. year old female  has a past medical history of Allergic conjunctivitis, Arthritis, Bronchitis, Bruxism, sleep-related (04/13/2014), Depression, Esophageal reflux, Family history of heart disease, H/O abdominal hysterectomy, Hyperlipidemia, Hypertension, Insomnia (04/13/2014), Obstructive sleep apnea, and Uterine cancer ([place]). here with:  1. Obstructive sleep apnea on CPAP 2. Neuropathy 3. Insomnia  Patient CPAP  download shows excellent compliance and good treatment of her apnea.  She is encouraged to continue using CPAP nightly and greater than 4 hours each night.  The patient is encouraged to adopt a regular sleep/wake routine.  She was advised that she should eliminate screen time approximately 1 hour before bedtime.  If this is not beneficial for her insomnia she can try taking melatonin approximately 3 mg 2 hours before bedtime.  The patient has had blood work in the past for her neuropathy.  Her hemoglobin A1c was 6.6.  She has never had nerve conduction studies.  For now we will continue to monitor symptoms.  If her symptoms worsen we may proceed with testing.  She is advised that if her symptoms worsen or she develops new symptoms she should let us know.  She will follow-up in 1 year or sooner if needed     Ward Givens, MSN, NP-C 11/17/2019, 8:02 AM [facility] [address] [place], [place] [address] [phone]

## 2019-11-17 NOTE — Patient Instructions (Addendum)
Continue using CPAP nightly and greater than 4 hours each night Continue to monitor neuropathy Develop a sleep routine. If insomnia does not improve can try Over the counter melatonin 3 mg 2 hours before bedtime If your symptoms worsen or you develop new symptoms please let us know.

## 2019-11-21 ENCOUNTER — Other Ambulatory Visit: Payer: Self-pay | Admitting: Internal Medicine

## 2019-11-24 ENCOUNTER — Encounter (INDEPENDENT_AMBULATORY_CARE_PROVIDER_SITE_OTHER): Payer: Medicare Other | Admitting: Ophthalmology

## 2019-11-24 DIAGNOSIS — H43813 Vitreous degeneration, bilateral: Secondary | ICD-10-CM

## 2019-11-24 DIAGNOSIS — H353132 Nonexudative age-related macular degeneration, bilateral, intermediate dry stage: Secondary | ICD-10-CM | POA: Diagnosis not present

## 2019-11-24 DIAGNOSIS — H35033 Hypertensive retinopathy, bilateral: Secondary | ICD-10-CM | POA: Diagnosis not present

## 2019-11-24 DIAGNOSIS — I10 Essential (primary) hypertension: Secondary | ICD-10-CM | POA: Diagnosis not present

## 2020-02-18 ENCOUNTER — Other Ambulatory Visit: Payer: Self-pay | Admitting: Internal Medicine

## 2020-03-08 ENCOUNTER — Telehealth: Payer: Self-pay | Admitting: Neurology

## 2020-03-08 NOTE — Telephone Encounter (Signed)
Called the patient back. She states that she figured out its a hole in her tubing and so she is unable to use the machine because of that. She had reached out to [employer] before and was told she couldn't receive the supplies until April. At the time she called she didn't know there was a hole there. Advised I would pass this information along to see if someone could assist her with getting her what she needs. I also gave the patient the local office number for her to reach out. She was appreciative and will call back if unable to get assistance.

## 2020-03-08 NOTE — Telephone Encounter (Signed)
Pt left me a voicemail message stating, "she is in [place] currently and her cpap machine has stopped working." Please advise.

## 2020-04-08 ENCOUNTER — Other Ambulatory Visit: Payer: Self-pay | Admitting: Internal Medicine

## 2020-04-09 ENCOUNTER — Other Ambulatory Visit: Payer: Self-pay | Admitting: Internal Medicine

## 2020-04-28 ENCOUNTER — Encounter: Payer: Self-pay | Admitting: Internal Medicine

## 2020-04-28 ENCOUNTER — Other Ambulatory Visit: Payer: Self-pay

## 2020-04-28 ENCOUNTER — Ambulatory Visit (INDEPENDENT_AMBULATORY_CARE_PROVIDER_SITE_OTHER): Payer: Medicare Other | Admitting: Internal Medicine

## 2020-04-28 VITALS — BP 144/82 | HR 62 | Temp 99.0°F | Ht 61.0 in | Wt 160.0 lb

## 2020-04-28 DIAGNOSIS — R7301 Impaired fasting glucose: Secondary | ICD-10-CM

## 2020-04-28 DIAGNOSIS — F5101 Primary insomnia: Secondary | ICD-10-CM

## 2020-04-28 DIAGNOSIS — I10 Essential (primary) hypertension: Secondary | ICD-10-CM | POA: Diagnosis not present

## 2020-04-28 DIAGNOSIS — Z0000 Encounter for general adult medical examination without abnormal findings: Secondary | ICD-10-CM | POA: Diagnosis not present

## 2020-04-28 DIAGNOSIS — E782 Mixed hyperlipidemia: Secondary | ICD-10-CM | POA: Diagnosis not present

## 2020-04-28 LAB — COMPREHENSIVE METABOLIC PANEL
ALT: 26 U/L (ref 0–35)
AST: 20 U/L (ref 0–37)
Albumin: 4.4 g/dL (ref 3.5–5.2)
Alkaline Phosphatase: 83 U/L (ref 39–117)
BUN: 15 mg/dL (ref 6–23)
CO2: 30 mEq/L (ref 19–32)
Calcium: 9.6 mg/dL (ref 8.4–10.5)
Chloride: 102 mEq/L (ref 96–112)
Creatinine, Ser: 0.87 mg/dL (ref 0.40–1.20)
GFR: 63.36 mL/min (ref >60.00)
Glucose, Bld: 158 mg/dL — ABNORMAL HIGH (ref 70–99)
Potassium: 4.9 mEq/L (ref 3.5–5.1)
Sodium: 140 mEq/L (ref 135–145)
Total Bilirubin: 0.7 mg/dL (ref 0.2–1.2)
Total Protein: 7 g/dL (ref 6.0–8.3)

## 2020-04-28 LAB — CBC
HCT: 38.4 % (ref 36.0–46.0)
Hemoglobin: 12.6 g/dL (ref 12.0–15.0)
MCHC: 32.9 g/dL (ref 30.0–36.0)
MCV: 85 fl (ref 78.0–100.0)
Platelets: 234 10*3/uL (ref 150.0–400.0)
RBC: 4.52 Mil/uL (ref 3.87–5.11)
RDW: 16 % — ABNORMAL HIGH (ref 11.5–15.5)
WBC: 6.9 10*3/uL (ref 4.0–10.5)

## 2020-04-28 LAB — LIPID PANEL
Cholesterol: 143 mg/dL (ref 0–200)
HDL: 40.4 mg/dL (ref >39.00)
LDL Cholesterol: 72 mg/dL (ref 0–99)
NonHDL: 102.24
Total CHOL/HDL Ratio: 4
Triglycerides: 153 mg/dL — ABNORMAL HIGH (ref 0.0–149.0)
VLDL: 30.6 mg/dL (ref 0.0–40.0)

## 2020-04-28 LAB — HEMOGLOBIN A1C: Hgb A1c MFr Bld: 6.8 % — ABNORMAL HIGH (ref 4.6–6.5)

## 2020-04-28 MED ORDER — TRAZODONE HCL 50 MG PO TABS: 50.0000 mg | ORAL_TABLET | Freq: Every evening | ORAL | 3 refills | Status: DC | PRN

## 2020-04-28 NOTE — Patient Instructions (Signed)
Health Maintenance, Female Adopting a healthy lifestyle and getting preventive care are important in promoting health and wellness. Ask your health care provider about:  The right schedule for you to have regular tests and exams.  Things you can do on your own to prevent diseases and keep yourself healthy. What should I know about diet, weight, and exercise? Eat a healthy diet   Eat a diet that includes plenty of vegetables, fruits, low-fat dairy products, and lean protein.  Do not eat a lot of foods that are high in solid fats, added sugars, or sodium. Maintain a healthy weight Body mass index (BMI) is used to identify weight problems. It estimates body fat based on height and weight. Your health care provider can help determine your BMI and help you achieve or maintain a healthy weight. Get regular exercise Get regular exercise. This is one of the most important things you can do for your health. Most adults should:  Exercise for at least 150 minutes each week. The exercise should increase your heart rate and make you sweat (moderate-intensity exercise).  Do strengthening exercises at least twice a week. This is in addition to the moderate-intensity exercise.  Spend less time sitting. Even light physical activity can be beneficial. Watch cholesterol and blood lipids Have your blood tested for lipids and cholesterol at 76 years of age, then have this test every 5 years. Have your cholesterol levels checked more often if:  Your lipid or cholesterol levels are high.  You are older than 76 years of age.  You are at high risk for heart disease. What should I know about cancer screening? Depending on your health history and family history, you may need to have cancer screening at various ages. This may include screening for:  Breast cancer.  Cervical cancer.  Colorectal cancer.  Skin cancer.  Lung cancer. What should I know about heart disease, diabetes, and high blood  pressure? Blood pressure and heart disease  High blood pressure causes heart disease and increases the risk of stroke. This is more likely to develop in people who have high blood pressure readings, are of African descent, or are overweight.  Have your blood pressure checked: ? Every 3-5 years if you are 18-39 years of age. ? Every year if you are 40 years old or older. Diabetes Have regular diabetes screenings. This checks your fasting blood sugar level. Have the screening done:  Once every three years after age 40 if you are at a normal weight and have a low risk for diabetes.  More often and at a younger age if you are overweight or have a high risk for diabetes. What should I know about preventing infection? Hepatitis B If you have a higher risk for hepatitis B, you should be screened for this virus. Talk with your health care provider to find out if you are at risk for hepatitis B infection. Hepatitis C Testing is recommended for:  Everyone born from 1945 through 1965.  Anyone with known risk factors for hepatitis C. Sexually transmitted infections (STIs)  Get screened for STIs, including gonorrhea and chlamydia, if: ? You are sexually active and are younger than 76 years of age. ? You are older than 76 years of age and your health care provider tells you that you are at risk for this type of infection. ? Your sexual activity has changed since you were last screened, and you are at increased risk for chlamydia or gonorrhea. Ask your health care provider if   you are at risk.  Ask your health care provider about whether you are at high risk for HIV. Your health care provider may recommend a prescription medicine to help prevent HIV infection. If you choose to take medicine to prevent HIV, you should first get tested for HIV. You should then be tested every 3 months for as long as you are taking the medicine. Pregnancy  If you are about to stop having your period (premenopausal) and  you may become pregnant, seek counseling before you get pregnant.  Take 400 to 800 micrograms (mcg) of folic acid every day if you become pregnant.  Ask for birth control (contraception) if you want to prevent pregnancy. Osteoporosis and menopause Osteoporosis is a disease in which the bones lose minerals and strength with aging. This can result in bone fractures. If you are 65 years old or older, or if you are at risk for osteoporosis and fractures, ask your health care provider if you should:  Be screened for bone loss.  Take a calcium or vitamin D supplement to lower your risk of fractures.  Be given hormone replacement therapy (HRT) to treat symptoms of menopause. Follow these instructions at home: Lifestyle  Do not use any products that contain nicotine or tobacco, such as cigarettes, e-cigarettes, and chewing tobacco. If you need help quitting, ask your health care provider.  Do not use street drugs.  Do not share needles.  Ask your health care provider for help if you need support or information about quitting drugs. Alcohol use  Do not drink alcohol if: ? Your health care provider tells you not to drink. ? You are pregnant, may be pregnant, or are planning to become pregnant.  If you drink alcohol: ? Limit how much you use to 0-1 drink a day. ? Limit intake if you are breastfeeding.  Be aware of how much alcohol is in your drink. In the [place] one drink equals one 12 oz bottle of beer (355 mL), one 5 oz glass of wine (148 mL), or one 1 oz glass of hard liquor (44 mL). General instructions  Schedule regular health, dental, and eye exams.  Stay current with your vaccines.  Tell your health care provider if: ? You often feel depressed. ? You have ever been abused or do not feel safe at home. Summary  Adopting a healthy lifestyle and getting preventive care are important in promoting health and wellness.  Follow your health care provider's instructions about healthy  diet, exercising, and getting tested or screened for diseases.  Follow your health care provider's instructions on monitoring your cholesterol and blood pressure. This information is not intended to replace advice given to you by your health care provider. Make sure you discuss any questions you have with your health care provider. Document Revised: 11/27/2018 Document Reviewed: 11/27/2018 Elsevier Patient Education  2020 Elsevier Inc.  

## 2020-04-28 NOTE — Progress Notes (Signed)
Subjective:   Patient ID: Amber Roman, female    DOB: 1946/10/10, 74 y.o.   MRN: [id number]  HPI Here for medicare wellness, no new complaints. Please see A/P for status and treatment of chronic medical problems.   HPI #2: Here for follow up blood pressure (BP at goal on her irbesartan/hctz and metoprolol, denies side effects, denies headaches or chest pains) and cholesterol (taking crestor daily, denies side effects, denies chest pains or stroke symptoms) and insomnia (using cymbalta for this, overall not satisfied with level of control, wanted to get off the lorazepam so has not had for some time but would be interested in something milder to help her sleep)  Diet: heart healthy Physical activity: sedentary Depression/mood screen: negative Hearing: intact to whispered voice Visual acuity: grossly normal, performs annual eye exam  ADLs: capable Fall risk: none Home safety: good Cognitive evaluation: intact to orientation, naming, recall and repetition EOL planning: adv directives discussed    Office Visit from 04/28/2020 in [place] at [facility] Total Score  0      I have personally reviewed and have noted 1. The patient's medical and social history - reviewed today no changes 2. Their use of alcohol, tobacco or illicit drugs 3. Their current medications and supplements 4. The patient's functional ability including ADL's, fall risks, home safety risks and hearing or visual impairment. 5. Diet and physical activities 6. Evidence for depression or mood disorders 7. Care team reviewed and updated  Patient Care Team: Hoyt Koch, MD as PCP - General (Internal Medicine) Lorretta Harp, MD as PCP - Cardiology (Cardiology) Past Medical History:  Diagnosis Date  . Allergic conjunctivitis   . Arthritis   . Bronchitis   . Bruxism, sleep-related 04/13/2014  . Depression   . Esophageal reflux   . Family history of heart disease   . H/O abdominal  hysterectomy    w/o recurrence  . Hyperlipidemia   . Hypertension   . Insomnia 04/13/2014  . Obstructive sleep apnea    improved on C Pap  . Uterine cancer [facility])    hysterectomy   Past Surgical History:  Procedure Laterality Date  . BILATERAL SALPINGOOPHORECTOMY    . CARPAL TUNNEL RELEASE     bilateral  . DOPPLER ECHOCARDIOGRAPHY    . NASAL SEPTUM SURGERY    . NM MYOVIEW LTD    . ROTATOR CUFF REPAIR Right   . TOTAL ABDOMINAL HYSTERECTOMY    . TOTAL HIP ARTHROPLASTY  01/10/2013   Procedure: TOTAL HIP ARTHROPLASTY ANTERIOR APPROACH;  Surgeon: Mcarthur Rossetti, MD;  Location: [facility];  Service: Orthopedics;  Laterality: Right;   Family History  Problem Relation Age of Onset  . Heart attack Father   . High blood pressure Father   . Multiple sclerosis Father   . Lung disease Mother   . Colon cancer Brother   . High blood pressure Brother   . Diabetes Mellitus II Sister   . High blood pressure Sister    Review of Systems  Constitutional: Negative.   HENT: Negative.   Eyes: Negative.   Respiratory: Negative for cough, chest tightness and shortness of breath.   Cardiovascular: Negative for chest pain, palpitations and leg swelling.  Gastrointestinal: Negative for abdominal distention, abdominal pain, constipation, diarrhea, nausea and vomiting.  Musculoskeletal: Negative.   Skin: Negative.   Neurological: Negative.   Psychiatric/Behavioral: Negative.     Objective:  Physical Exam Constitutional:      Appearance: She  is well-developed.  HENT:     Head: Normocephalic and atraumatic.  Neck:     Vascular: No carotid bruit.  Cardiovascular:     Rate and Rhythm: Normal rate and regular rhythm.  Pulmonary:     Effort: Pulmonary effort is normal. No respiratory distress.     Breath sounds: Normal breath sounds. No wheezing or rales.  Abdominal:     General: Bowel sounds are normal. There is no distension.     Palpations: Abdomen is soft.     Tenderness: There is no  abdominal tenderness. There is no rebound.  Musculoskeletal:     Cervical back: Normal range of motion.  Skin:    General: Skin is warm and dry.  Neurological:     Mental Status: She is alert and oriented to person, place, and time.     Coordination: Coordination normal.     Vitals:   04/28/20 1009  BP: (!) 144/82  Pulse: 62  Temp: 99 F (37.2 C)  SpO2: 99%  Weight: 160 lb (72.6 kg)  Height: 5\' 1"  (1.549 m)   This visit occurred during the SARS-CoV-2 public health emergency.  Safety protocols were in place, including screening questions prior to the visit, additional usage of staff PPE, and extensive cleaning of exam room while observing appropriate contact time as indicated for disinfecting solutions.   Assessment & Plan:

## 2020-04-29 NOTE — Assessment & Plan Note (Signed)
Rx trazodone to help for sleep as she is no longer taking ativan for sleep.

## 2020-04-29 NOTE — Assessment & Plan Note (Signed)
Flu shot due next season. Covid-19 complete. Pneumonia complete. Shingrix complete. Tetanus due 2024. Colonoscopy aged out prior to recall. Mammogram due 2022, pap smear aged out and dexa due 2022. Counseled about sun safety and mole surveillance. Counseled about the dangers of distracted driving. Given 10 year screening recommendations.

## 2020-04-29 NOTE — Assessment & Plan Note (Signed)
Checking lipid panel and adjust crestor as needed.

## 2020-04-29 NOTE — Assessment & Plan Note (Signed)
Checking CMP and adjust irbesartan/hctz and metoprolol as needed. BP at goal during visit.

## 2020-05-07 ENCOUNTER — Other Ambulatory Visit: Payer: Self-pay | Admitting: Internal Medicine

## 2020-05-09 ENCOUNTER — Encounter: Payer: Self-pay | Admitting: Internal Medicine

## 2020-05-10 ENCOUNTER — Encounter: Payer: Self-pay | Admitting: *Deleted

## 2020-05-11 ENCOUNTER — Encounter: Payer: Self-pay | Admitting: Internal Medicine

## 2020-05-11 ENCOUNTER — Ambulatory Visit (INDEPENDENT_AMBULATORY_CARE_PROVIDER_SITE_OTHER): Payer: Medicare Other

## 2020-05-11 ENCOUNTER — Ambulatory Visit (INDEPENDENT_AMBULATORY_CARE_PROVIDER_SITE_OTHER): Payer: Medicare Other | Admitting: Internal Medicine

## 2020-05-11 ENCOUNTER — Other Ambulatory Visit: Payer: Self-pay

## 2020-05-11 VITALS — BP 124/64 | HR 72 | Temp 98.1°F | Ht 61.5 in | Wt 158.2 lb

## 2020-05-11 DIAGNOSIS — J45909 Unspecified asthma, uncomplicated: Secondary | ICD-10-CM | POA: Diagnosis not present

## 2020-05-11 DIAGNOSIS — Z87891 Personal history of nicotine dependence: Secondary | ICD-10-CM | POA: Diagnosis not present

## 2020-05-11 DIAGNOSIS — G4733 Obstructive sleep apnea (adult) (pediatric): Secondary | ICD-10-CM | POA: Diagnosis not present

## 2020-05-11 DIAGNOSIS — J4540 Moderate persistent asthma, uncomplicated: Secondary | ICD-10-CM | POA: Diagnosis not present

## 2020-05-11 DIAGNOSIS — Z9989 Dependence on other enabling machines and devices: Secondary | ICD-10-CM | POA: Diagnosis not present

## 2020-05-11 DIAGNOSIS — J4521 Mild intermittent asthma with (acute) exacerbation: Secondary | ICD-10-CM | POA: Diagnosis not present

## 2020-05-11 MED ORDER — METHYLPREDNISOLONE ACETATE 80 MG/ML IJ SUSP
80.0000 mg | Freq: Once | INTRAMUSCULAR | Status: AC
Start: 2020-05-11 — End: 2020-05-11
  Administered 2020-05-11: 80 mg via INTRAMUSCULAR

## 2020-05-11 MED ORDER — IPRATROPIUM-ALBUTEROL 0.5-2.5 (3) MG/3ML IN SOLN: 3.0000 mL | Freq: Four times a day (QID) | RESPIRATORY_TRACT | 12 refills | Status: AC | PRN

## 2020-05-11 MED ORDER — AZITHROMYCIN 250 MG PO TABS
ORAL_TABLET | ORAL | 0 refills | Status: DC
Start: 2020-05-11 — End: 2021-02-09

## 2020-05-11 MED ORDER — DIPHENOXYLATE-ATROPINE 2.5-0.025 MG PO TABS: 1.0000 | ORAL_TABLET | Freq: Four times a day (QID) | ORAL | 5 refills | Status: DC | PRN

## 2020-05-11 MED ORDER — COMPRESSOR NEBULIZER MISC: 0 refills | Status: AC

## 2020-05-11 MED ORDER — ALBUTEROL SULFATE HFA 108 (90 BASE) MCG/ACT IN AERS: INHALATION_SPRAY | RESPIRATORY_TRACT | 12 refills | Status: DC

## 2020-05-11 NOTE — Progress Notes (Signed)
HPI  female former smoker followed for asthma/bronchitis, rhinitis, allergic conjunctivitis, complicated by OSA ([facility]/ Dr Brett Fairy), GERD PFT 09/17/17-minimal diffusion defect.  FVC 2.50/96%, FEV1 2.13/109%, ratio 0.85, FEF 25-75% 2.63/163%, no response to dilator.  TLC 92%, DLCO 77%.  ---------------------------------------------------------------------------   02/28/2019- 73 year old female former smoker followed for asthma/bronchitis, rhinitis, allergic conjunctivitis, complicated by OSA ([facility]), GERD CPAP 7/ managed by Neurology Dr Brett Fairy Memory Dance 100, Proair hfa, flonase Travel- flew here from [place] 3/11. Called on 3/12 reporting increased wheeze x 2 days, some nonproductive cough. At that call she denied fever. -----pt states has had sore throat, SOB, chest tightness since getting off plane 02/26/2019; started wheezing 4 days ago Says her usual meds work fine. Considers this "typical start of bronchitis for me". No fever, myalgias or GI upset. No recognized sick exposure.  Daughter is driving her back to [place] and she asks note for airline. UTD flu shot  05/11/20- 74 year old female former smoker followed for asthma/bronchitis, rhinitis, allergic conjunctivitis, complicated by OSA ([facility]), GERD CPAP 7/ managed by Neurology Dr Brett Fairy. Trazodone from PCP helps occ insomnia.   Breo 100, Proair hfa, flonase -----SOB/ wheezing/ cough are worse since last visit  Anoro too expensive. Breo usually good. Did well in [place] late winter, leaving before hot weather. Did well in [place]. Got back here and over last week says with heat and humidity she is coughing yellow beige with no fever or sore throat. Using rescue inhaler 2-3x/ day. Had 2 Moderna Covax. She asks I refill lomotil for occ use- thinks I filled it before.   ROS-see HPI + = positive Constitutional:   No-   weight loss, night sweats, fevers, chills, fatigue, lassitude. HEENT:   No-  headaches, difficulty swallowing, tooth/dental  problems, +sore throat,     No- sneezing, itching, ear ache,             nasal congestion, post nasal drip,  CV:  No-   chest pain, orthopnea, PND, swelling in lower extremities, anasarca, dizziness, palpitations Resp: No-   shortness of breath with exertion or at rest.                No- coughing up of blood, + productive cough, +nonproductive cough            +change in color of mucus.  + Occasional wheezing.   Skin: No-   rash or lesions. GI:  No-   heartburn, indigestion, abdominal pain, nausea, vomiting,  GU:   Neuro-     nothing unusual Psych:  No- change in mood or affect. No depression or anxiety.  No memory loss.  OBJ General- Alert, Oriented, Affect-appropriate, Distress- none acute, + overweight Skin- rash-none, lesions- none, excoriation- none Lymphadenopathy- none Head- atraumatic            Eyes- Gross vision intact, PERRLA, conjunctivae look pale, clear secretions            Ears- Hearing,             Nose- , no-Septal dev, mucus, polyps, erosion, perforation             Throat- Mallampati III , mucosa clear , drainage- none, tonsils- atrophic Neck- flexible , trachea midline, no stridor , thyroid nl, carotid no bruit Chest - symmetrical excursion , unlabored           Heart/CV- RRR , no murmur , no gallop  , no rub, nl s1 s2                           -  JVD- none , edema- none, stasis changes- none, varices- none           Lung-  Wheeze+ light, cough-none, dullness-none, rub- none           Chest wall-  Abd-  Br/ Gen/ Rectal- Not done, not indicated Extrem- cyanosis- none, clubbing, none, atrophy- none, strength- nl Neuro- grossly intact to observation. No nystagmus, and no carotid bruit.

## 2020-05-11 NOTE — Patient Instructions (Addendum)
Refill scripts sent for Lomotil and Proair  Script sent for Zpak antibiotic   2 today then one daily  Order- depo 80   Dx Asthma moderate persistent  Ok to continue [employer]- DME [employer]---  Compressor nebulizer and Duoneb for dx asthma moderate persistent  Order- CXR   Dx asthma moderate persistent

## 2020-05-11 NOTE — Assessment & Plan Note (Signed)
Mild exacerbation, nonspecific. Plan- Depomedrol, Zpak, fluids. DME to provide nebulizer machine and Duoneb covered by Medicare.

## 2020-05-11 NOTE — Assessment & Plan Note (Signed)
Continues CPAP 7 managed by Neurology and reports no concerns at this visit.

## 2020-05-12 ENCOUNTER — Other Ambulatory Visit: Payer: Self-pay | Admitting: *Deleted

## 2020-05-12 NOTE — Progress Notes (Signed)
Patient identification verified, results of recent CXR reviewed.  Per Dr. Annamaria Boots, CXR- the heart and lungs look normal.  Patient verbalized understanding of results.

## 2020-05-13 ENCOUNTER — Encounter: Payer: Self-pay | Admitting: Physician Assistant

## 2020-05-13 ENCOUNTER — Other Ambulatory Visit: Payer: Self-pay

## 2020-05-13 ENCOUNTER — Ambulatory Visit (INDEPENDENT_AMBULATORY_CARE_PROVIDER_SITE_OTHER): Payer: Medicare Other | Admitting: Physician Assistant

## 2020-05-13 DIAGNOSIS — Z1283 Encounter for screening for malignant neoplasm of skin: Secondary | ICD-10-CM | POA: Diagnosis not present

## 2020-05-13 DIAGNOSIS — Z85828 Personal history of other malignant neoplasm of skin: Secondary | ICD-10-CM

## 2020-05-13 DIAGNOSIS — L821 Other seborrheic keratosis: Secondary | ICD-10-CM | POA: Diagnosis not present

## 2020-05-13 NOTE — Progress Notes (Signed)
   Follow-Up Visit   Subjective  Amber Roman is a 74 y.o. female who presents for the following: Annual Exam (no real concerns- "just getting bumps everywhere didnt know if that was normal "). Crusty and dark   The following portions of the chart were reviewed this encounter and updated as appropriate: Tobacco  Allergies  Meds  Problems  Med Hx  Surg Hx  Fam Hx      Objective  Well appearing patient in no apparent distress; mood and affect are within normal limits.  A full examination was performed including scalp, head, eyes, ears, nose, lips, neck, chest, axillae, abdomen, back, buttocks, bilateral upper extremities, bilateral lower extremities, hands, feet, fingers, toes, fingernails, and toenails. All findings within normal limits unless otherwise noted below.  Objective  Left Tip of Nose: Scars clear  Objective  Left Upper Arm - Anterior: Clear scar  Objective  Left Lower Back, Left Thigh - Anterior, Neck - Anterior, Right Thigh - Anterior, Right Upper Arm - Anterior, Right Upper Back: Stuck-on, waxy papules and plaques.    Assessment & Plan  History of SCC (squamous cell carcinoma) of skin Left Tip of Nose  observe  History of basal cell carcinoma (BCC) Left Upper Arm - Anterior  observe  Screening exam for skin cancer Head to Toe  Seborrheic keratosis (6) Neck - Anterior; Right Upper Arm - Anterior; Left Thigh - Anterior; Right Thigh - Anterior; Right Upper Back; Left Lower Back  observe

## 2020-05-24 DIAGNOSIS — H5201 Hypermetropia, right eye: Secondary | ICD-10-CM | POA: Diagnosis not present

## 2020-05-24 DIAGNOSIS — Z1231 Encounter for screening mammogram for malignant neoplasm of breast: Secondary | ICD-10-CM | POA: Diagnosis not present

## 2020-05-24 DIAGNOSIS — H353112 Nonexudative age-related macular degeneration, right eye, intermediate dry stage: Secondary | ICD-10-CM | POA: Diagnosis not present

## 2020-05-24 DIAGNOSIS — H02002 Unspecified entropion of right lower eyelid: Secondary | ICD-10-CM | POA: Diagnosis not present

## 2020-05-24 DIAGNOSIS — Z961 Presence of intraocular lens: Secondary | ICD-10-CM | POA: Diagnosis not present

## 2020-05-24 LAB — HM MAMMOGRAPHY

## 2020-06-07 ENCOUNTER — Telehealth: Payer: Self-pay | Admitting: Internal Medicine

## 2020-06-07 DIAGNOSIS — J4540 Moderate persistent asthma, uncomplicated: Secondary | ICD-10-CM

## 2020-06-07 MED ORDER — BREZTRI AEROSPHERE 160-9-4.8 MCG/ACT IN AERO: 2.0000 | INHALATION_SPRAY | Freq: Two times a day (BID) | RESPIRATORY_TRACT | 5 refills | Status: DC

## 2020-06-07 NOTE — Telephone Encounter (Signed)
Ok to order- CXR- exacerbation of asthma                     Schedule PFT    dx Chronic cough                     Offer 2 samples of Breztri inhaler    inhale 2 puffs, then rinse mouth, twice daily     See if this helps

## 2020-06-07 NOTE — Telephone Encounter (Signed)
Spoke with pt. States that she is having issues with a dry cough. Denies chest tightness, wheezing or increased shortness of breath. States that Dr. Annamaria Boots gave her Augmentin in April 2020 to take on a trip with her just in case. Reports that she started that antibiotic on Friday due to driving down a dusty road with the windows down. The cough is keeping her awake at night. Pt states that she did a [employer] television/film/video and it says that she could possibly have COPD. Pt would like Dr. Janee Morn recommendations.  Dr. Annamaria Boots - please advise. Thanks.

## 2020-06-07 NOTE — Telephone Encounter (Signed)
Spoke with pt. She is aware of Dr. Janee Morn response. PFT has been scheduled. PFT and CXR orders have been placed. Pt requested that Breztri be sent in to her pharmacy. This has been done. Nothing further was needed.

## 2020-06-25 DIAGNOSIS — M5416 Radiculopathy, lumbar region: Secondary | ICD-10-CM | POA: Diagnosis not present

## 2020-06-25 DIAGNOSIS — M545 Low back pain: Secondary | ICD-10-CM | POA: Diagnosis not present

## 2020-06-25 DIAGNOSIS — M7022 Olecranon bursitis, left elbow: Secondary | ICD-10-CM | POA: Diagnosis not present

## 2020-06-25 DIAGNOSIS — M542 Cervicalgia: Secondary | ICD-10-CM | POA: Diagnosis not present

## 2020-07-07 DIAGNOSIS — M545 Low back pain: Secondary | ICD-10-CM | POA: Diagnosis not present

## 2020-07-09 DIAGNOSIS — M5416 Radiculopathy, lumbar region: Secondary | ICD-10-CM | POA: Diagnosis not present

## 2020-07-09 DIAGNOSIS — M542 Cervicalgia: Secondary | ICD-10-CM | POA: Diagnosis not present

## 2020-07-09 DIAGNOSIS — M545 Low back pain: Secondary | ICD-10-CM | POA: Diagnosis not present

## 2020-07-13 ENCOUNTER — Other Ambulatory Visit: Payer: Self-pay | Admitting: Internal Medicine

## 2020-07-16 ENCOUNTER — Other Ambulatory Visit: Payer: Self-pay

## 2020-07-16 ENCOUNTER — Ambulatory Visit (INDEPENDENT_AMBULATORY_CARE_PROVIDER_SITE_OTHER): Payer: Medicare Other | Admitting: Internal Medicine

## 2020-07-16 ENCOUNTER — Telehealth: Payer: Self-pay | Admitting: Internal Medicine

## 2020-07-16 DIAGNOSIS — J4540 Moderate persistent asthma, uncomplicated: Secondary | ICD-10-CM | POA: Diagnosis not present

## 2020-07-16 LAB — PULMONARY FUNCTION TEST
DL/VA % pred: 91 %
DL/VA: 3.84 ml/min/mmHg/L
DLCO cor % pred: 89 %
DLCO cor: 15.68 ml/min/mmHg
DLCO unc % pred: 89 %
DLCO unc: 15.68 ml/min/mmHg
FEF 25-75 Post: 2.58 L/sec
FEF 25-75 Pre: 1.8 L/sec
FEF2575-%Change-Post: 43 %
FEF2575-%Pred-Post: 170 %
FEF2575-%Pred-Pre: 118 %
FEV1-%Change-Post: 9 %
FEV1-%Pred-Post: 103 %
FEV1-%Pred-Pre: 94 %
FEV1-Post: 1.94 L
FEV1-Pre: 1.77 L
FEV1FVC-%Change-Post: 5 %
FEV1FVC-%Pred-Pre: 107 %
FEV6-%Change-Post: 3 %
FEV6-%Pred-Post: 96 %
FEV6-%Pred-Pre: 92 %
FEV6-Post: 2.29 L
FEV6-Pre: 2.21 L
FEV6FVC-%Change-Post: 0 %
FEV6FVC-%Pred-Post: 105 %
FEV6FVC-%Pred-Pre: 105 %
FVC-%Change-Post: 4 %
FVC-%Pred-Post: 91 %
FVC-%Pred-Pre: 88 %
FVC-Post: 2.3 L
FVC-Pre: 2.21 L
Post FEV1/FVC ratio: 84 %
Post FEV6/FVC ratio: 100 %
Pre FEV1/FVC ratio: 80 %
Pre FEV6/FVC Ratio: 100 %
RV % pred: 94 %
RV: 2.04 L
TLC % pred: 92 %
TLC: 4.31 L

## 2020-07-16 NOTE — Telephone Encounter (Signed)
Patient contacted and informed that Dr. Annamaria Boots has not reviewed her PFT results and that we will call her as soon as we hear back from him. PFT done on 07/16/2020.

## 2020-07-16 NOTE — Telephone Encounter (Signed)
PFT has ben read

## 2020-07-16 NOTE — Progress Notes (Signed)
PFT done today. 

## 2020-07-19 ENCOUNTER — Telehealth: Payer: Self-pay | Admitting: Internal Medicine

## 2020-07-19 MED ORDER — AZITHROMYCIN 250 MG PO TABS
250.0000 mg | ORAL_TABLET | Freq: Once | ORAL | 0 refills | Status: AC
Start: 2020-07-19 — End: 2020-07-19

## 2020-07-19 MED ORDER — METOPROLOL SUCCINATE ER 100 MG PO TB24: 100.0000 mg | ORAL_TABLET | Freq: Every day | ORAL | 1 refills | Status: DC

## 2020-07-19 NOTE — Telephone Encounter (Signed)
Patient called with results of PFT. She is reporting a cough for 10 days, tightness in chest, using nebulizer 2x a day, no fever, SOB is unchanged. She is requesting a Z pack. She had bronchitis in May and got a Z pack, feels like she has the same symptoms. Please advise.

## 2020-07-19 NOTE — Telephone Encounter (Signed)
    Patient states she has no metoprolol succinate (TOPROL-XL) 100 MG 24 hr tablet remaining. She is going out of town today Requesting 90 day supply to [place], [place]

## 2020-07-19 NOTE — Telephone Encounter (Signed)
RX for Zpak sent to patients preferred pharmacy. ATC patient to let her know left message letting her know. Nothing further needed at this time.

## 2020-07-19 NOTE — Telephone Encounter (Signed)
Pt uses [facility] on [address]

## 2020-07-19 NOTE — Telephone Encounter (Signed)
Ok to offer Zpak 250 mg, # 6, 2 today then one daily. Ok to refill her asthma meds if needed

## 2020-07-19 NOTE — Telephone Encounter (Signed)
Pulmonary Function Test - very minimal slowing, but basically normal

## 2020-07-19 NOTE — Telephone Encounter (Signed)
LMTCB x 1- need to verify pharmacy

## 2020-07-19 NOTE — Telephone Encounter (Signed)
Dr. Annamaria Boots any results we need to relay to the patient for her PFT?

## 2020-08-01 ENCOUNTER — Other Ambulatory Visit: Payer: Self-pay | Admitting: Internal Medicine

## 2020-08-14 ENCOUNTER — Other Ambulatory Visit: Payer: Self-pay | Admitting: Internal Medicine

## 2020-09-16 ENCOUNTER — Other Ambulatory Visit: Payer: Self-pay | Admitting: Internal Medicine

## 2020-09-16 NOTE — Telephone Encounter (Signed)
Patient called and she was requesting a medication refill for meloxicam (MOBIC) 15 MG tablet  It can be sent to [place], [address] AT [address]   She is also requesting a 90 day supply.   Please call patient back [phone]

## 2020-09-17 ENCOUNTER — Other Ambulatory Visit: Payer: Self-pay | Admitting: Family

## 2020-09-17 ENCOUNTER — Other Ambulatory Visit: Payer: Self-pay

## 2020-09-17 ENCOUNTER — Telehealth: Payer: Self-pay | Admitting: Internal Medicine

## 2020-09-17 DIAGNOSIS — Z23 Encounter for immunization: Secondary | ICD-10-CM | POA: Diagnosis not present

## 2020-09-17 NOTE — Telephone Encounter (Signed)
Patient called and was requesting a call back in regards to the Meloxicam 15mg  tablet 90 day supply. She can be reached at [phone]

## 2020-09-17 NOTE — Telephone Encounter (Signed)
According to notes here, Meloxicam has not been written by Dr. Sharlet Salina. Dr. Almedia Roman prescribed #90 with 2 refills in May 2021. She needs to follow-up with this provider or check with her pharmacy for refills since she should be able to refill from the May prescription.

## 2020-09-17 NOTE — Telephone Encounter (Signed)
Records indicate that she had zoster vax 05/30/11  I called and informed her of this  Nothing further needed

## 2020-09-20 MED ORDER — MELOXICAM 15 MG PO TABS: 15.0000 mg | ORAL_TABLET | Freq: Every day | ORAL | 0 refills | Status: DC

## 2020-09-20 NOTE — Telephone Encounter (Signed)
meloxicam (MOBIC) 15 MG tablet  [id number] [place], [address] AT [address] Phone:  [phone]  Fax:  [phone]     Patient requesting a 90 day refill

## 2020-09-21 ENCOUNTER — Other Ambulatory Visit: Payer: Self-pay | Admitting: Internal Medicine

## 2020-09-21 DIAGNOSIS — H353112 Nonexudative age-related macular degeneration, right eye, intermediate dry stage: Secondary | ICD-10-CM | POA: Diagnosis not present

## 2020-09-21 DIAGNOSIS — H52203 Unspecified astigmatism, bilateral: Secondary | ICD-10-CM | POA: Diagnosis not present

## 2020-09-21 NOTE — Telephone Encounter (Signed)
Refilled already on 09/21/20.

## 2020-10-29 DIAGNOSIS — Z23 Encounter for immunization: Secondary | ICD-10-CM | POA: Diagnosis not present

## 2020-11-15 ENCOUNTER — Encounter (INDEPENDENT_AMBULATORY_CARE_PROVIDER_SITE_OTHER): Payer: Medicare Other | Admitting: Ophthalmology

## 2020-11-16 ENCOUNTER — Ambulatory Visit: Payer: Medicare Other | Admitting: Adult Health

## 2020-11-23 ENCOUNTER — Telehealth: Payer: Self-pay | Admitting: Internal Medicine

## 2020-11-23 NOTE — Progress Notes (Signed)
  Chronic Care Management   Outreach Note  11/23/2020 Name: Amber Roman MRN: [id number] DOB: 1946-11-17  Referred by: Hoyt Koch, MD Reason for referral : No chief complaint on file.   An unsuccessful telephone outreach was attempted today. The patient was referred to the pharmacist for assistance with care management and care coordination.   Follow Up Plan:   Carley Perdue [employer] Scheduler

## 2020-11-24 ENCOUNTER — Telehealth: Payer: Self-pay | Admitting: Internal Medicine

## 2020-11-24 NOTE — Progress Notes (Signed)
°  Chronic Care Management   Note  11/24/2020 Name: Amber Roman MRN: [id number] DOB: 1946/01/06  Amber Roman is a 74 y.o. year old female who is a primary care patient of Hoyt Koch, MD. I reached out to Edmonia James by phone today in response to a referral sent by Amber Roman's PCP, Hoyt Koch, MD.   [name] was given information about Chronic Care Management services today including:  1. CCM service includes personalized support from designated clinical staff supervised by her physician, including individualized plan of care and coordination with other care providers 2. 24/7 contact phone numbers for assistance for urgent and routine care needs. 3. Service will only be billed when office clinical staff spend 20 minutes or more in a month to coordinate care. 4. Only one practitioner may furnish and bill the service in a calendar month. 5. The patient may stop CCM services at any time (effective at the end of the month) by phone call to the office staff.   Patient agreed to services and verbal consent obtained.   Follow up plan:   Carley Perdue [employer] Scheduler

## 2020-11-24 NOTE — Telephone Encounter (Signed)
    Patient calling to report she had shingles vaccine 10/1 and 12/3

## 2020-11-25 NOTE — Telephone Encounter (Signed)
Chart updated. See immunizations.

## 2020-12-12 ENCOUNTER — Other Ambulatory Visit: Payer: Self-pay | Admitting: Internal Medicine

## 2021-01-05 ENCOUNTER — Telehealth: Payer: Medicare Other

## 2021-01-05 NOTE — Chronic Care Management (AMB) (Deleted)
Chronic Care Management Pharmacy Note  01/05/2021 Name:  Amber Roman MRN:  [id number] DOB:  1946/11/10  Subjective: Amber Roman is an 75 y.o. year old female who is a primary patient of Hoyt Koch, MD.  The CCM team was consulted for assistance with disease management and care coordination needs.    Engaged with patient by telephone for initial visit in response to provider referral for pharmacy case management and/or care coordination services.   Consent to Services:   The patient was given the following information about Chronic Care Management services today, agreed to services, and gave verbal consent: 1. CCM service includes personalized support from designated clinical staff supervised by the primary care provider, including individualized plan of care and coordination with other care providers 2. 24/7 contact phone numbers for assistance for urgent and routine care needs. 3. Service will only be billed when office clinical staff spend 20 minutes or more in a month to coordinate care. 4. Only one practitioner may furnish and bill the service in a calendar month. 5.The patient may stop CCM services at any time (effective at the end of the month) by phone call to the office staff. 6. The patient will be responsible for cost sharing (co-pay) of up to 20% of the service fee (after annual deductible is met). Patient agreed to services and consent obtained.   Recent office visits: 04/28/20 Dr Sharlet Salina OV: chronic f/u, rx'd trazodone for sleep (no longer taking lorazepam). a1c 6.8, monitor for now but work on lifestyle modifications.   Recent consult visits: 07/09/20 Dr Mardelle Matte (ortho): f/u for low back pain, cervicalgia  05/13/20 PA Robyne Askew (dermatology): f/u for hx [facility], [facility] - observe  05/11/20 Dr Annamaria Boots (pulmonary):  F/u asthma/bronchitis, mild exacerbation, rx'd depomedrol, zpak, fluids.  Objective:  Lab Results  Component Value Date   CREATININE 0.87 04/28/2020    BUN 15 04/28/2020   GFR 63.36 04/28/2020   GFRNONAA >90 01/11/2013   GFRAA >90 01/11/2013   NA 140 04/28/2020   K 4.9 04/28/2020   CALCIUM 9.6 04/28/2020   CO2 30 04/28/2020    Lab Results  Component Value Date/Time   HGBA1C 6.8 (H) 04/28/2020 10:35 AM   HGBA1C 6.6 09/19/2017 12:00 AM   GFR 63.36 04/28/2020 10:35 AM   MICROALBUR 6.0 09/19/2017 12:00 AM    Last diabetic Eye exam: No results found for: HMDIABEYEEXA  Last diabetic Foot exam: No results found for: HMDIABFOOTEX   Lab Results  Component Value Date   CHOL 143 04/28/2020   HDL 40.40 04/28/2020   LDLCALC 72 04/28/2020   TRIG 153.0 (H) 04/28/2020   CHOLHDL 4 04/28/2020    Hepatic Function Latest Ref Rng & Units 04/28/2020 07/22/2019 09/19/2017  Total Protein 6.0 - 8.3 g/dL 7.0 7.1 -  Albumin 3.5 - 5.2 g/dL 4.4 5.0(H) -  AST 0 - 37 U/L _0 ALT 0 - 35 U/L 26 34(H) 21  Alk Phosphatase 39 - 117 U/L 83 85 67  Total Bilirubin 0.2 - 1.2 mg/dL 0.7 0.3 -  Bilirubin, Direct 0.00 - 0.40 mg/dL - 0.10 -    Lab Results  Component Value Date/Time   TSH 3.59 09/19/2017 12:00 AM    CBC Latest Ref Rng & Units 04/28/2020 09/19/2017 09/03/2017  WBC 4.0 - 10.5 K/uL 6.9 10.7 10.6(H)  Hemoglobin 12.0 - 15.0 g/dL 12.6 13.1 13.6  Hematocrit 36.0 - 46.0 % 38.4 40 40.9  Platelets 150.0 - 400.0 K/uL 234.0 300 366.0  Lab Results  Component Value Date/Time   VD25OH 39.2 09/19/2017 12:00 AM    Clinical ASCVD: Yes  The 10-year ASCVD risk score Mikey Bussing DC Jr., et al., 2013) is: 37.1%   Values used to calculate the score:     Age: 59 years     Sex: Female     Is Non-Hispanic African American: No     Diabetic: Yes     Tobacco smoker: No     Systolic Blood Pressure: 031 mmHg     Is BP treated: Yes     HDL Cholesterol: 40.4 mg/dL     Total Cholesterol: 143 mg/dL    Depression screen [facility] 2/9 04/28/2020 06/07/2018  Decreased Interest 0 0  Down, Depressed, Hopeless 0 0  PHQ - 2 Score 0 0   No flowsheet data found.  BP Readings  from Last 3 Encounters:  05/11/20 124/64  04/28/20 (!) 144/82  11/17/19 132/62   Pulse Readings from Last 3 Encounters:  05/11/20 72  04/28/20 62  11/17/19 (!) 58   Wt Readings from Last 3 Encounters:  05/11/20 158 lb 3.2 oz (71.8 kg)  04/28/20 160 lb (72.6 kg)  11/17/19 159 lb 3.2 oz (72.2 kg)    Assessment/Interventions: Review of patient past medical history, allergies, medications, health status, including review of consultants reports, laboratory and other test data, was performed as part of comprehensive evaluation and provision of chronic care management services.   SDOH:  (Social Determinants of Health) assessments and interventions performed:    CCM Care Plan  Allergies  Allergen Reactions  . Sulfonamide Derivatives Other (See Comments)    Childhood allergy     Medications Reviewed Today    Reviewed by Starlyn Skeans (Physician Assistant) on 05/13/20 at [phone]  Med List Status: <None>  Medication Order Taking? Sig Documenting Provider Last Dose Status Informant  albuterol (PROAIR HFA) 108 (90 Base) MCG/ACT inhaler [id number] Yes INHALE 2 PUFFS INTO THE LUNGS EVERY 6 HOURS AS NEEDED FOR WHEEZING OR SHORTNESS OF Kerby Less, MD Taking Active   ANORO ELLIPTA 62.5-25 MCG/INH AEPB [id number] Yes INHALE 1 PUFF INTO THE LUNGS DAILY Baird Lyons D, MD Taking Active   azelastine (OPTIVAR) 0.05 % ophthalmic solution [id number] Yes Place 1 drop into both eyes 2 (two) times daily. Deneise Lever, MD Taking Active   azithromycin [facility]) 250 MG tablet [id number] Yes 2 today then one daily Baird Lyons D, MD Taking Active   BREO ELLIPTA 100-25 MCG/INH AEPB [id number] Yes INHALE 1 PUFF BY MOUTH INTO THE LUNGS DAILY Deneise Lever, MD Taking Active   diphenoxylate-atropine (LOMOTIL) 2.5-0.025 MG tablet [id number] Yes Take 1 tablet by mouth 4 (four) times daily as needed. For stomach Baird Lyons D, MD Taking Active   DULoxetine (CYMBALTA) 60 MG capsule  [id number] Yes Take 1 capsule by mouth daily. [provider] Taking Active            Med Note Lockie Pares, Kerman Passey Oct 25, 2015  2:31 PM) Received from: External Pharmacy Received Sig:   fluticasone (FLONASE) 50 MCG/ACT nasal spray [id number] Yes 1-2 puffs each nostril once daily while needed Baird Lyons D, MD Taking Active   gabapentin (NEURONTIN) 300 MG capsule [id number] Yes Take 1 capsule (300 mg total) by mouth 2 (two) times daily. Annual appt due in May must see provider for future refills Hoyt Koch, MD Taking Active   guaiFENesin (MUCINEX) 600 MG 12 hr tablet [id number] Yes Take 1,200  mg by mouth 2 (two) times daily as needed. For chest tightness [provider] Taking Active Self  ipratropium-albuterol (DUONEB) 0.5-2.5 (3) MG/3ML SOLN [id number] Yes Take 3 mLs by nebulization every 6 (six) hours as needed. Deneise Lever, MD Taking Active   irbesartan-hydrochlorothiazide (AVALIDE) 300-12.5 MG tablet [id number] Yes Take 1 tablet by mouth daily. Hoyt Koch, MD Taking Active   meloxicam [facility]) 15 MG tablet [id number] Yes 15 mg daily. [provider] Taking Active            Med Note Ebony Cargo Dec 06, 2015 11:41 AM) Received from: External Pharmacy Received Sig: take 1 tablet by mouth once daily with food  metoprolol succinate (TOPROL-XL) 100 MG 24 hr tablet [id number] Yes Take 1 tablet (100 mg total) by mouth daily. Hoyt Koch, MD Taking Active   Multiple Vitamin (MULTIVITAMIN) tablet [id number] Yes Take 1 tablet by mouth daily. [provider] Taking Active Self  Multiple Vitamins-Minerals (PRESERVISION AREDS PO) [id number] Yes Take by mouth daily. [provider] Taking Active   Multiple Vitamins-Minerals (VITAMIN D3 COMPLETE PO) [id number] Yes Take 50 mcg by mouth daily. [provider] Taking Active Self  Nebulizers (COMPRESSOR NEBULIZER) Platea [id number] Yes As directed Deneise Lever, MD  Taking Active   OVER THE COUNTER MEDICATION [id number] Yes Vitamin B6 190m daily in the am [provider] Taking Active   rosuvastatin (CRESTOR) 20 MG tablet [id number] Take 1 tablet (20 mg total) by mouth daily. CHoyt Koch MD Taking Active   traZODone (DESYREL) 50 MG tablet [id number] Take 1 tablet (50 mg total) by mouth at bedtime as needed for sleep. CHoyt Koch MD Taking Active           Patient Active Problem List   Diagnosis Date Noted  . Routine general medical examination at a health care facility 04/24/2019  . Claudication in peripheral vascular disease ([place] 12/04/2017  . Stopped smoking with greater than 30 pack year history 08/08/2017  . Ataxia 08/19/2014  . OSA on CPAP 07/03/2014  . Insomnia 04/13/2014  . Degenerative arthritis of hip 01/10/2013  . Hyperlipidemia 05/20/2009  . Essential hypertension 05/20/2009  . Asthmatic bronchitis with acute exacerbation 11/19/2007    Immunization History  Administered Date(s) Administered  . DT (Pediatric) 06/16/2013  . Fluad Quad(high Dose 65+) 09/19/2019  . Influenza Split 08/19/2011, 09/17/2013, 11/16/2014, 09/15/2015, 09/14/2016  . Influenza Whole 09/26/2006, 09/17/2009, 10/06/2010, 09/16/2012  . Influenza-Unspecified 09/26/2017, 08/30/2018  . Moderna Sars-Covid-2 Vaccination 01/23/2020, 02/19/2020  . Pneumococcal Conjugate-13 10/22/2018  . Pneumococcal Polysaccharide-23 10/29/2003, 09/17/2009  . Pneumococcal-Unspecified 05/22/2014, 07/06/2014  . Td 07/20/2003  . Tdap 06/19/2013  . Zoster 05/30/2011  . Zoster Recombinat (Shingrix) 09/17/2020, 11/19/2020    Conditions to be addressed/monitored:  {CCM ASSESSMENT DZ OPTIONS:25047}  There are no care plans to display for this patient.   Medication Assistance: {MEDASSISTANCEINFO:25044}  Patient's preferred pharmacy is:  [place] [address] [address]Phone: [phone]: [address]Phone: [phone]: 8[place] [address] [facility] [place]Phone: [phone]: [facility] [address] [place] [address] Phone: [phone] Fax: [phone]  [id number] - [place], [place] - [address] AT [place] [place] [address] Phone: [phone] Fax: [phone]  Uses pill box? {Yes or If no, why not?:20788} Pt endorses ***% compliance  We discussed: {Pharmacy options:24294}  Plan: {US Pharmacy JQBH:41937}    Follow Up:  {FOLLOWUP:24991}  Plan: {CM FOLLOW UP PLAN:25073}  ***    Current Barriers:  . {pharmacybarriers:24917} . ***  Pharmacist Clinical Goal(s):  [name] Over the next *** days, patient will {PHARMACYGOALCHOICES:24921} through collaboration with PharmD and provider.  . ***  Interventions: . 1:1 collaboration with Hoyt Koch, MD regarding development and update of comprehensive plan of care as evidenced by provider attestation and co-signature . Inter-disciplinary care team collaboration (see longitudinal plan of care) . Comprehensive medication review performed; medication list updated in electronic medical record  Hypertension (BP goal <130/80) -{CHL Controlled/[phone]} -Current treatment: . Irbesartan-HCTZ 300-12.5 mg daily . Metoprolol succinate 100 mg daily -Medications previously tried: n/a  -Current home readings: *** -Current dietary habits: *** -Current exercise habits:  *** -{ACTIONS;DENIES/REPORTS:21021675::"Denies"} hypotensive/hypertensive symptoms -Educated on {CCM BP Counseling:25124} -Counseled to monitor BP at home ***, document, and provide log at future appointments -{CCMPHARMDINTERVENTION:25122}  Hyperlipidemia: (LDL goal < 70) -Hx PAD w/ claudication -{CHL Controlled/[phone]} -Current treatment: . Rosuvastatin 20 mg daily -Medications previously tried: n/a -Current dietary patterns: *** -Current exercise habits: *** -Educated on {CCM HLD Counseling:25126} -{CCMPHARMDINTERVENTION:25122}  Asthmatic bronchitis (Goal: control symptoms and prevent exacerbations) -{CHL Controlled/[phone]} -Current treatment  . Anoro Ellipta 1 puff daily - not filled . **Breo Ellipta 1 puff daily . Breztri Aerosphere 2 puffs BID - not filled . Duoneb PRN . Albuterol HFA prn . Mucinex PRN . Fluticasone nasal spray PRN -Medications previously tried: ***  -Pulmonary function testing: 07/16/2020 - FEV1 103% predicted, FEV1/FVC 0.84 -Exacerbations requiring treatment in last 6 months: 2 -Patient {Actions; denies-reports:120008} consistent use of maintenance inhaler -Frequency of rescue inhaler use: *** -Counseled on {CCMINHALERCOUNSELING:25121} -{CCMPHARMDINTERVENTION:25122}  Anxiety/Insomnia -{CHL Controlled/[phone]} -Current treatment: . Duloxetine 60 mg daily . Trazodone 50 mg HS prn -Medications previously tried/failed: lorazepam -PHQ9: 0 -GAD: not on file -Connected with PCP for mental health support -Educated on {CCM mental health counseling:25127} -{CCMPHARMDINTERVENTION:25122}  Chronic pain (Goal: manage pain) -degenerative arthritis of hip, low back pain, cervicalgia -{CHL Controlled/[phone]} -Current treatment  . Meloxicam 15 mg daily . Gabapentin 300 mg BID -Medications previously tried: ***  -{CCMPHARMDINTERVENTION:25122}  GI / diarrhea (Goal: ***) -{CHL  Controlled/[phone]} -Current treatment  . Lomotil PRN -Medications previously tried: ***  -{CCMPHARMDINTERVENTION:25122}  Health Maintenance -Current therapy:  . Multivitamin . Preservision Areds . Vitamin D 2000 IU daily . Vitamin B6 100 mg daily . Optivar (azelastine) eye drops -{CCM Patient satisfied:25129} -{CCMPHARMDINTERVENTION:25122}  Patient Goals/Self-Care Activities . Over the next *** days, patient will:  - {pharmacypatientgoals:24919}  Follow Up Plan: {CM FOLLOW UP TKWI:09735}

## 2021-01-17 ENCOUNTER — Telehealth: Payer: Self-pay | Admitting: Pharmacist

## 2021-01-17 NOTE — Progress Notes (Signed)
Chronic Care Management Pharmacy Assistant   Name: Amber Roman  MRN: [id number] DOB: [birth year]  Reason for Encounter: Chart Review   PCP : Hoyt Koch, MD  Allergies:   Allergies  Allergen Reactions   Sulfonamide Derivatives Other (See Comments)    Childhood allergy     Medications: Outpatient Encounter Medications as of 01/17/2021  Medication Sig Note   albuterol (PROAIR HFA) 108 (90 Base) MCG/ACT inhaler INHALE 2 PUFFS INTO THE LUNGS EVERY 6 HOURS AS NEEDED FOR WHEEZING OR SHORTNESS OF BREATH    ANORO ELLIPTA 62.5-25 MCG/INH AEPB INHALE 1 PUFF INTO THE LUNGS DAILY    azelastine (OPTIVAR) 0.05 % ophthalmic solution Place 1 drop into both eyes 2 (two) times daily.    azithromycin (ZITHROMAX) 250 MG tablet 2 today then one daily    BREO ELLIPTA 100-25 MCG/INH AEPB INHALE 1 PUFF BY MOUTH INTO THE LUNGS DAILY    Budeson-Glycopyrrol-Formoterol (BREZTRI AEROSPHERE) 160-9-4.8 MCG/ACT AERO Inhale 2 puffs into the lungs 2 (two) times daily.    diphenoxylate-atropine (LOMOTIL) 2.5-0.025 MG tablet Take 1 tablet by mouth 4 (four) times daily as needed. For stomach    DULoxetine (CYMBALTA) 60 MG capsule Take 1 capsule by mouth daily. 10/25/2015: Received from: External Pharmacy Received Sig:    fluticasone (FLONASE) 50 MCG/ACT nasal spray 1-2 puffs each nostril once daily while needed    gabapentin (NEURONTIN) 300 MG capsule TAKE 1 CAPSULE BY MOUTH TWICE DAILY. ANNUAL APPOINTMENT DUE IN MAY, MUST SEE PROVIDER FOR FUTURE REFILLS    guaiFENesin (MUCINEX) 600 MG 12 hr tablet Take 1,200 mg by mouth 2 (two) times daily as needed. For chest tightness    ipratropium-albuterol (DUONEB) 0.5-2.5 (3) MG/3ML SOLN Take 3 mLs by nebulization every 6 (six) hours as needed.    irbesartan-hydrochlorothiazide (AVALIDE) 300-12.5 MG tablet Take 1 tablet by mouth daily.    meloxicam (MOBIC) 15 MG tablet TAKE 1 TABLET(15 MG) BY MOUTH DAILY    metoprolol succinate (TOPROL-XL) 100 MG 24  hr tablet TAKE 1 TABLET(100 MG) BY MOUTH DAILY    Multiple Vitamin (MULTIVITAMIN) tablet Take 1 tablet by mouth daily.    Multiple Vitamins-Minerals (PRESERVISION AREDS PO) Take by mouth daily.    Multiple Vitamins-Minerals (VITAMIN D3 COMPLETE PO) Take 50 mcg by mouth daily.    Nebulizers (COMPRESSOR NEBULIZER) MISC As directed    OVER THE COUNTER MEDICATION Vitamin B6 100mg  daily in the am    rosuvastatin (CRESTOR) 20 MG tablet Take 1 tablet (20 mg total) by mouth daily.    traZODone (DESYREL) 50 MG tablet TAKE 1 TABLET(50 MG) BY MOUTH AT BEDTIME AS NEEDED FOR SLEEP    No facility-administered encounter medications on file as of 01/17/2021.    Current Diagnosis: Patient Active Problem List   Diagnosis Date Noted   Routine general medical examination at a health care facility 04/24/2019   Claudication in peripheral vascular disease ([place]) 12/04/2017   Stopped smoking with greater than 30 pack year history 08/08/2017   Ataxia 08/19/2014   OSA on CPAP 07/03/2014   Insomnia 04/13/2014   Degenerative arthritis of hip 01/10/2013   Hyperlipidemia 05/20/2009   Essential hypertension 05/20/2009   Asthmatic bronchitis with acute exacerbation 11/19/2007    Goals Addressed   None     Follow-Up:  Pharmacist Review   Reviewed chart for medication changes and adherence.    No gaps in adherence identified. Patient has follow up scheduled with pharmacy team. No further action required.   Olivia Mackie Ellis,Clinical  Pharmacy Assistant [facility] [phone]

## 2021-02-04 ENCOUNTER — Telehealth: Payer: Self-pay | Admitting: Pharmacist

## 2021-02-04 NOTE — Progress Notes (Signed)
Chronic Care Management Pharmacy Assistant   Name: Amber Roman  MRN: [id number] DOB: Apr 18, 1946  Reason for Encounter: Initial Questions   PCP : Hoyt Koch, MD  Allergies:   Allergies  Allergen Reactions  . Sulfonamide Derivatives Other (See Comments)    Childhood allergy     Medications: Outpatient Encounter Medications as of 02/04/2021  Medication Sig Note  . albuterol (PROAIR HFA) 108 (90 Base) MCG/ACT inhaler INHALE 2 PUFFS INTO THE LUNGS EVERY 6 HOURS AS NEEDED FOR WHEEZING OR SHORTNESS OF BREATH   . ANORO ELLIPTA 62.5-25 MCG/INH AEPB INHALE 1 PUFF INTO THE LUNGS DAILY   . azelastine (OPTIVAR) 0.05 % ophthalmic solution Place 1 drop into both eyes 2 (two) times daily.   [name] azithromycin (ZITHROMAX) 250 MG tablet 2 today then one daily   . BREO ELLIPTA 100-25 MCG/INH AEPB INHALE 1 PUFF BY MOUTH INTO THE LUNGS DAILY   . Budeson-Glycopyrrol-Formoterol (BREZTRI AEROSPHERE) 160-9-4.8 MCG/ACT AERO Inhale 2 puffs into the lungs 2 (two) times daily.   . diphenoxylate-atropine (LOMOTIL) 2.5-0.025 MG tablet Take 1 tablet by mouth 4 (four) times daily as needed. For stomach   . DULoxetine (CYMBALTA) 60 MG capsule Take 1 capsule by mouth daily. 10/25/2015: Received from: External Pharmacy Received Sig:   . fluticasone (FLONASE) 50 MCG/ACT nasal spray 1-2 puffs each nostril once daily while needed   . gabapentin (NEURONTIN) 300 MG capsule TAKE 1 CAPSULE BY MOUTH TWICE DAILY. ANNUAL APPOINTMENT DUE IN MAY, MUST SEE PROVIDER FOR FUTURE REFILLS   . guaiFENesin (MUCINEX) 600 MG 12 hr tablet Take 1,200 mg by mouth 2 (two) times daily as needed. For chest tightness   . ipratropium-albuterol (DUONEB) 0.5-2.5 (3) MG/3ML SOLN Take 3 mLs by nebulization every 6 (six) hours as needed.   . irbesartan-hydrochlorothiazide (AVALIDE) 300-12.5 MG tablet Take 1 tablet by mouth daily.   . meloxicam (MOBIC) 15 MG tablet TAKE 1 TABLET(15 MG) BY MOUTH DAILY   . metoprolol succinate (TOPROL-XL) 100  MG 24 hr tablet TAKE 1 TABLET(100 MG) BY MOUTH DAILY   . Multiple Vitamin (MULTIVITAMIN) tablet Take 1 tablet by mouth daily.   . Multiple Vitamins-Minerals (PRESERVISION AREDS PO) Take by mouth daily.   . Multiple Vitamins-Minerals (VITAMIN D3 COMPLETE PO) Take 50 mcg by mouth daily.   . Nebulizers (COMPRESSOR NEBULIZER) MISC As directed   . OVER THE COUNTER MEDICATION Vitamin B6 100mg  daily in the am   . rosuvastatin (CRESTOR) 20 MG tablet Take 1 tablet (20 mg total) by mouth daily.   . traZODone (DESYREL) 50 MG tablet TAKE 1 TABLET(50 MG) BY MOUTH AT BEDTIME AS NEEDED FOR SLEEP    No facility-administered encounter medications on file as of 02/04/2021.    Current Diagnosis: Patient Active Problem List   Diagnosis Date Noted  . Routine general medical examination at a health care facility 04/24/2019  . Claudication in peripheral vascular disease ([place]) 12/04/2017  . Stopped smoking with greater than 30 pack year history 08/08/2017  . Ataxia 08/19/2014  . OSA on CPAP 07/03/2014  . Insomnia 04/13/2014  . Degenerative arthritis of hip 01/10/2013  . Hyperlipidemia 05/20/2009  . Essential hypertension 05/20/2009  . Asthmatic bronchitis with acute exacerbation 11/19/2007    Goals Addressed   None     Follow-Up:  Pharmacist Review   Have you seen any other providers since your last visit? The patient states that she has not seen any other providers recently. Patient states that she spends the winter months in  [place]   Any changes in your medications or health? The patient states that she did not pick up her breo inhaler this month due to cost, over $400,  Any side effects from any medications? The patient doe s not have any side effects from medications  Do you have an symptoms or problems not managed by your medications? The patient states that she does not have any symptoms or problems that are not managed by medications  Any concerns about your health right now? The patient  states she has been off balance lately and is a little concerned, may just be old age.  Has your provider asked that you check blood pressure, blood sugar, or follow special diet at home? The patient states that she does not check blood pressure at home, not diabetic, and no special diet  Do you get any type of exercise on a regular basis? The patient states that she does walk her dog twice a day  Can you think of a goal you would like to reach for your health? The patient goal is to stay healthy until she is 75 yrs old   Do you have any problems getting your medications? The patient states that the only problem she has right now is the cost of the breo inhaler  Is there anything that you would like to discuss during the appointment? The patient states that she does not have anything specific to discuss at this time  Patient agrees with televisit and will have medications and supplements at appointment   Wendy Poet, [place] [phone]  Time spent:39

## 2021-02-09 ENCOUNTER — Ambulatory Visit (INDEPENDENT_AMBULATORY_CARE_PROVIDER_SITE_OTHER): Payer: Medicare Other | Admitting: Pharmacist

## 2021-02-09 ENCOUNTER — Other Ambulatory Visit: Payer: Self-pay

## 2021-02-09 DIAGNOSIS — J4520 Mild intermittent asthma, uncomplicated: Secondary | ICD-10-CM

## 2021-02-09 DIAGNOSIS — E782 Mixed hyperlipidemia: Secondary | ICD-10-CM | POA: Diagnosis not present

## 2021-02-09 DIAGNOSIS — R7301 Impaired fasting glucose: Secondary | ICD-10-CM

## 2021-02-09 DIAGNOSIS — M1610 Unilateral primary osteoarthritis, unspecified hip: Secondary | ICD-10-CM | POA: Diagnosis not present

## 2021-02-09 DIAGNOSIS — E119 Type 2 diabetes mellitus without complications: Secondary | ICD-10-CM

## 2021-02-09 DIAGNOSIS — I10 Essential (primary) hypertension: Secondary | ICD-10-CM | POA: Diagnosis not present

## 2021-02-09 DIAGNOSIS — F5101 Primary insomnia: Secondary | ICD-10-CM

## 2021-02-09 NOTE — Progress Notes (Signed)
Chronic Care Management Pharmacy Note  02/09/2021 Name:  Amber Roman MRN:  [id number] DOB:  1946/08/24  Subjective: Amber Roman is an 75 y.o. year old female who is a primary patient of Amber Koch, MD.  The CCM team was consulted for assistance with disease management and care coordination needs.    Engaged with patient by telephone for initial visit in response to provider referral for pharmacy case management and/or care coordination services.   Consent to Services:  The patient was given the following information about Chronic Care Management services today, agreed to services, and gave verbal consent: 1. CCM service includes personalized support from designated clinical staff supervised by the primary care provider, including individualized plan of care and coordination with other care providers 2. 24/7 contact phone numbers for assistance for urgent and routine care needs. 3. Service will only be billed when office clinical staff spend 20 minutes or more in a month to coordinate care. 4. Only one practitioner may furnish and bill the service in a calendar month. 5.The patient may stop CCM services at any time (effective at the end of the month) by phone call to the office staff. 6. The patient will be responsible for cost sharing (co-pay) of up to 20% of the service fee (after annual deductible is met). Patient agreed to services and consent obtained.  Patient Care Team: Amber Koch, MD as PCP - General (Internal Medicine) Amber Harp, MD as PCP - Cardiology (Cardiology) Amber Roman, [facility] as Pharmacist (Pharmacist)  Patient is current living in [place], [place] - she lives there every winter and returns to [facility] in the summer.   Pt reports balance is getting progressively worse - has had 2 falls (1 in Nov and 1 this week). She fell on her side and did not hit her head. She denies dizziness/lightheadedness. She did not lose consciousness.   Recent office  visits: 04/28/20 Dr Sharlet Salina OV: f/u BP, cholesterol, insomnia. A1c up, no meds indicated yet. Rx'd trazodone PRN and pt has stopped lorazepam on her own.  Recent consult visits: 09/21/20 Dr Ellie Lunch (ophthalmology) f/u macular degeneration 07/16/20 PFTs performed. Indicate mild obstructive disease, basically normal. 07/09/20 Dr Mardelle Matte (ortho): f/u low back pain  Hospital visits: None in previous 6 months  Objective:  Lab Results  Component Value Date   CREATININE 0.87 04/28/2020   BUN 15 04/28/2020   GFR 63.36 04/28/2020   GFRNONAA >90 01/11/2013   GFRAA >90 01/11/2013   NA 140 04/28/2020   K 4.9 04/28/2020   CALCIUM 9.6 04/28/2020   CO2 30 04/28/2020    Lab Results  Component Value Date/Time   HGBA1C 6.8 (H) 04/28/2020 10:35 AM   HGBA1C 6.6 09/19/2017 12:00 AM   GFR 63.36 04/28/2020 10:35 AM   MICROALBUR 6.0 09/19/2017 12:00 AM    Last diabetic Eye exam: No results found for: HMDIABEYEEXA  Last diabetic Foot exam: No results found for: HMDIABFOOTEX   Lab Results  Component Value Date   CHOL 143 04/28/2020   HDL 40.40 04/28/2020   LDLCALC 72 04/28/2020   TRIG 153.0 (H) 04/28/2020   CHOLHDL 4 04/28/2020    Hepatic Function Latest Ref Rng & Units 04/28/2020 07/22/2019 09/19/2017  Total Protein 6.0 - 8.3 g/dL 7.0 7.1 -  Albumin 3.5 - 5.2 g/dL 4.4 5.0(H) -  AST 0 - 37 U/L '20 24 15  ' ALT 0 - 35 U/L 26 34(H) 21  Alk Phosphatase 39 - 117 U/L 83 85  67  Total Bilirubin 0.2 - 1.2 mg/dL 0.7 0.3 -  Bilirubin, Direct 0.00 - 0.40 mg/dL - 0.10 -    Lab Results  Component Value Date/Time   TSH 3.59 09/19/2017 12:00 AM    CBC Latest Ref Rng & Units 04/28/2020 09/19/2017 09/03/2017  WBC 4.0 - 10.5 K/uL 6.9 10.7 10.6(H)  Hemoglobin 12.0 - 15.0 g/dL 12.6 13.1 13.6  Hematocrit 36.0 - 46.0 % 38.4 40 40.9  Platelets 150.0 - 400.0 K/uL 234.0 300 366.0    Lab Results  Component Value Date/Time   VD25OH 39.2 09/19/2017 12:00 AM    Clinical ASCVD: No  The 10-year ASCVD risk score  Amber Roman DC Jr., et al., 2013) is: 37.1%   Values used to calculate the score:     Age: 33 years     Sex: Female     Is Non-Hispanic African American: No     Diabetic: Yes     Tobacco smoker: No     Systolic Blood Pressure: 371 mmHg     Is BP treated: Yes     HDL Cholesterol: 40.4 mg/dL     Total Cholesterol: 143 mg/dL    Depression screen [facility] 2/9 04/28/2020 06/07/2018  Decreased Interest 0 0  Down, Depressed, Hopeless 0 0  PHQ - 2 Score 0 0     Social History   Tobacco Use  Smoking Status Former Smoker  . Packs/day: 2.00  . Years: 15.00  . Pack years: 30.00  . Types: Cigarettes  . Quit date: 12/18/1978  . Years since quitting: 42.1  Smokeless Tobacco Never Used   BP Readings from Last 3 Encounters:  05/11/20 124/64  04/28/20 (!) 144/82  11/17/19 132/62   Pulse Readings from Last 3 Encounters:  05/11/20 72  04/28/20 62  11/17/19 (!) 58   Wt Readings from Last 3 Encounters:  05/11/20 158 lb 3.2 oz (71.8 kg)  04/28/20 160 lb (72.6 kg)  11/17/19 159 lb 3.2 oz (72.2 kg)    Assessment/Interventions: Review of patient past medical history, allergies, medications, health status, including review of consultants reports, laboratory and other test data, was performed as part of comprehensive evaluation and provision of chronic care management services.   SDOH:  (Social Determinants of Health) assessments and interventions performed: Yes SDOH Interventions   Flowsheet Row Most Recent Value  SDOH Interventions   Financial Strain Interventions Other (Comment)  [explained deductible, Breo will go down to ~$50/month once deductible is met]      CCM Care Plan  Allergies  Allergen Reactions  . Sulfonamide Derivatives Other (See Comments)    Childhood allergy     Medications Reviewed Today    Reviewed by Amber Roman, [facility] (Pharmacist) on 02/09/21 at [phone]  Med List Status: <None>  Medication Order Taking? Sig Documenting Provider Last Dose Status Informant  albuterol  (PROAIR HFA) 108 (90 Base) MCG/ACT inhaler [id number] Yes INHALE 2 PUFFS INTO THE LUNGS EVERY 6 HOURS AS NEEDED FOR WHEEZING OR SHORTNESS OF BREATH Young, Clinton D, MD Taking Active   azelastine (OPTIVAR) 0.05 % ophthalmic solution [id number] Yes Place 1 drop into both eyes 2 (two) times daily. Baird Lyons D, MD Taking Active   b complex vitamins capsule [id number] Yes Take 1 capsule by mouth daily. [provider] Taking Active   BREO ELLIPTA 100-25 MCG/INH AEPB [id number] Yes INHALE 1 PUFF BY MOUTH INTO THE LUNGS DAILY Deneise Lever, MD Taking Active   diphenoxylate-atropine (LOMOTIL) 2.5-0.025 MG tablet [id number] Yes Take 1 tablet  by mouth 4 (four) times daily as needed. For stomach Baird Lyons D, MD Taking Active   DULoxetine (CYMBALTA) 60 MG capsule [id number] Yes Take 1 capsule by mouth daily. [provider] Taking Active            Med Note Lockie Pares, Kerman Passey Oct 25, 2015  2:31 PM) Received from: External Pharmacy Received Sig:   fluticasone (FLONASE) 50 MCG/ACT nasal spray [id number] Yes 1-2 puffs each nostril once daily while needed Baird Lyons D, MD Taking Active   gabapentin (NEURONTIN) 300 MG capsule [id number] Yes TAKE 1 CAPSULE BY MOUTH TWICE DAILY. ANNUAL APPOINTMENT DUE IN MAY, MUST SEE PROVIDER FOR FUTURE REFILLS  Patient taking differently: Take 300 mg by mouth at bedtime.   Amber Koch, MD Taking Active   guaiFENesin (MUCINEX) 600 MG 12 hr tablet [id number] Yes Take 1,200 mg by mouth 2 (two) times daily as needed. For chest tightness [provider] Taking Active Self  ipratropium-albuterol (DUONEB) 0.5-2.5 (3) MG/3ML SOLN [id number] Yes Take 3 mLs by nebulization every 6 (six) hours as needed. Deneise Lever, MD Taking Active   irbesartan-hydrochlorothiazide (AVALIDE) 300-12.5 MG tablet [id number] Yes Take 1 tablet by mouth daily. Amber Koch, MD Taking Active   meloxicam [facility]) 15 MG tablet [id number] Yes TAKE 1 TABLET(15 MG)  BY MOUTH DAILY Amber Koch, MD Taking Active   metoprolol succinate (TOPROL-XL) 100 MG 24 hr tablet [id number] Yes TAKE 1 TABLET(100 MG) BY MOUTH DAILY Amber Koch, MD Taking Active   Multiple Vitamin (MULTIVITAMIN) tablet [id number] Yes Take 1 tablet by mouth daily. [provider] Taking Active Self  Multiple Vitamins-Minerals (PRESERVISION AREDS PO) [id number] Yes Take by mouth daily. [provider] Taking Active   Multiple Vitamins-Minerals (VITAMIN D3 COMPLETE PO) [id number] Yes Take 50 mcg by mouth daily. [provider] Taking Active Self  Nebulizers (COMPRESSOR NEBULIZER) MISC [id number] Yes As directed Deneise Lever, MD Taking Active   rosuvastatin (CRESTOR) 20 MG tablet [id number] Yes Take 1 tablet (20 mg total) by mouth daily. Amber Koch, MD Taking Active   traZODone (DESYREL) 50 MG tablet [id number] Yes TAKE 1 TABLET(50 MG) BY MOUTH AT BEDTIME AS NEEDED FOR SLEEP Amber Koch, MD Taking Active           Patient Active Problem List   Diagnosis Date Noted  . Routine general medical examination at a health care facility 04/24/2019  . Claudication in peripheral vascular disease ([place]) 12/04/2017  . Stopped smoking with greater than 30 pack year history 08/08/2017  . Ataxia 08/19/2014  . OSA on CPAP 07/03/2014  . Insomnia 04/13/2014  . Degenerative arthritis of hip 01/10/2013  . Hyperlipidemia 05/20/2009  . Essential hypertension 05/20/2009  . Asthmatic bronchitis with acute exacerbation 11/19/2007    Immunization History  Administered Date(s) Administered  . DT (Pediatric) 06/16/2013  . Fluad Quad(high Dose 65+) 09/19/2019  . Influenza Split 08/19/2011, 09/17/2013, 11/16/2014, 09/15/2015, 09/14/2016  . Influenza Whole 09/26/2006, 09/17/2009, 10/06/2010, 09/16/2012  . Influenza-Unspecified 09/26/2017, 08/30/2018  . Moderna Sars-Covid-2 Vaccination 01/23/2020, 02/19/2020  . Pneumococcal Conjugate-13 10/22/2018  .  Pneumococcal Polysaccharide-23 10/29/2003, 09/17/2009  . Pneumococcal-Unspecified 05/22/2014, 07/06/2014  . Td 07/20/2003  . Tdap 06/19/2013  . Zoster 05/30/2011  . Zoster Recombinat (Shingrix) 09/17/2020, 11/19/2020    Conditions to be addressed/monitored:  Hypertension, Hyperlipidemia, Diabetes, Asthma, Depression, Osteoarthritis and Neuropathy  Care Plan : [place]  Updates made by Amber Roman, [place] since 02/09/2021 12:00 AM  Problem: Hypertension, Hyperlipidemia, Diabetes, GERD, Asthma, Depression, Osteoarthritis and Neuropathy   Priority: High    Long-Range Goal: Disease management   Start Date: 02/09/2021  Expected End Date: 02/09/2022  This Visit's Progress: On track  Priority: High  Note:   Current Barriers:  . Unable to independently monitor therapeutic efficacy  Pharmacist Clinical Goal(s):  [name] Over the next 180 days, patient will achieve adherence to monitoring guidelines and medication adherence to achieve therapeutic efficacy through collaboration with PharmD and provider.   Interventions: . 1:1 collaboration with Amber Koch, MD regarding development and update of comprehensive plan of care as evidenced by provider attestation and co-signature . Inter-disciplinary care team collaboration (see longitudinal plan of care) . Comprehensive medication review performed; medication list updated in electronic medical record  Hypertension (BP goal <130/80) -Controlled -Current treatment: . Irbesartan-HCTZ 300-12.5 mg daily . Metoprolol succinate 100 mg daily -Current home readings: does not check -Current dietary habits: tuna salad, egg salad, taco salad, toast w/ peanut butter. She does try to limit salt. -Current exercise habits: walks her dog; used to do yoga -Denies hypotensive/hypertensive symptoms -Educated on BP goals and benefits of medications for prevention of heart attack, stroke and kidney damage; Daily salt intake goal < 2300  mg; Exercise goal of 150 minutes per week; -Counseled to monitor BP at home as needed -Counseled on diet and exercise extensively Recommended to continue current medication  Hyperlipidemia: (LDL goal < 100) -Controlled -Current treatment: . Rosuvastatin 20 mg daily -Educated on Cholesterol goals;  Benefits of statin for ASCVD risk reduction; Importance of limiting foods high in cholesterol; -Recommended to continue current medication  Asthma (Goal: control symptoms and prevent exacerbations) -Controlled - pt reports she had her heating/AC updated and since then asthma symptoms have vastly improved. She has not been using Breo, and has not needed Proair much.  -Current treatment  . Breo Ellipta (fluticasone-vilanterol) 100-25 mg 1 puff daily . Albuterol HFA prn . Duoneb q6h PRN -Medications previously tried: Cherylynn Ridges -Pulmonary function testing (07/16/20): FEV1 103% predicted, +9% change -Exacerbations requiring treatment in last 6 months: none -Patient denies consistent use of maintenance inhaler -Frequency of rescue inhaler use: less than once a week -Counseled on Proper inhaler technique; Benefits of consistent maintenance inhaler use When to use rescue inhaler -Recommended to continue current medication Assessed patient finances. Pt reports [facility] was too expensive to refill in January. Explained this is due to deductible, once that is met copay will go back to ~$50/month  Depression / Insomnia (Goal: manage symptoms) -Not ideally controlled - pt reports medication helps but she still has problems falling sleep for several hours after going to bed -Current treatment: . Duloxetine 60 mg daily . Trazodone 50 mg HS prn -Medications previously tried/failed: Viibyrd, Belsomra, zolpidem, Fetzima -PHQ9: 0 (04/2020) -GAD7: not on file -Connected with Dr [facility] for mental health support -Educated on sleep hygiene and advised to avoid screens at least 30 min before  bedtime -Recommended to take trazodone 30 minutes before bedtime -Recommended to continue current medication  Neuropathy / Osteoarthritis (Goal: manage pain) -Controlled - pt denies oversedation and reports pain is controlled as long as she can take meloxicam every day. -Current treatment  . Gabapentin 300 mg BID - takes at night only . Duloxetine 60 mg daily . Meloxicam 15 mg daily - wants 90 day supply -Educated on effect of meloxicam on kidney health; pt's kidney function has been normal, will continue to monitor -Recommended to continue current medication  Allergic rhinitis (  Goal: manage symptoms) -Controlled -Current treatment  . Fluticasone nasal spray PRN . Azelastine 0.05% eye drops . Mucinex 600 mg PRN -Patient is satisfied with current regimen and denies issues  -Recommended to continue current medication  Diabetes (A1c goal <7%) -Controlled - with diet/exercise -Current medications: . No medications -Denies hypoglycemic/hyperglycemic symptoms -Current meal patterns: eats frozen fruit now instead of ice cream -Current exercise:  -Educated onA1c and blood sugar goals; Complications of diabetes including kidney damage, retinal damage, and cardiovascular disease; Exercise goal of 150 minutes per week; Carbohydrate counting and/or plate method -Recommend to continue annual monitoring. If A1c rises > 7% medication would be indicated  Health Maintenance -Vaccine gaps: covid booster -Current therapy:  . Multivitamin . Preservision Areds . Vitamin D3 2000 IU . Vitamin B Complex mg daily . Diphenoxylate-atropine 2.5-0.025 mg PRN - 6 times a year  -Patient is satisfied with current therapy and denies issues -Discussed fall prevention - advised to wear socks with grips on the bottom and rubber-soled shoes around the house. Also discussed benefits of PT to improve balance, pt will consider this and discuss with PCP at follow up -Recommended to continue current  medication  Patient Goals/Self-Care Activities . Over the next 180 days, patient will:  - take medications as prescribed focus on medication adherence by pill box target a minimum of 150 minutes of moderate intensity exercise weekly engage in dietary modifications by reducing salt and carbohydrates in diet Wear socks/shoes with grips on the bottom to prevent falls  Follow Up Plan: Telephone follow up appointment with care management team member scheduled for: 6 months      Medication Assistance: None required.  Patient affirms current coverage meets needs.  Patient's preferred pharmacy is:  [id number] [address], [address] AT [address] [place] [address] Phone: [phone] Fax: [address], [address] [place] [address] Phone: [phone] Fax: [phone]  [id number] - [place], [address] [place] [address] [place] [address] Phone: [phone] Fax: [phone]  Uses pill box? Yes Pt endorses 100% compliance  We discussed: Current pharmacy is preferred with insurance plan and patient is satisfied with pharmacy services Patient decided to: Continue current medication management strategy  Care Plan and Follow Up Patient Decision:  Patient agrees to Care Plan and Follow-up.  Plan: Telephone follow up appointment with care management team member scheduled for:  6 months  Charlene Brooke, PharmD, [facility] Clinical Pharmacist [facility] [phone]

## 2021-02-09 NOTE — Patient Instructions (Addendum)
Visit Information  Phone number for Pharmacist: [phone]  Thank you for meeting with me to discuss your medications! I look forward to working with you to achieve your health care goals. Below is a summary of what we talked about during the visit:  Goals Addressed            This Visit's Progress   . Manage My Medicine       Timeframe:  Long-Range Goal Priority:  Medium Start Date:     02/09/21                        Expected End Date:    02/09/22                   Follow Up Date 08/17/21   - call for medicine refill 2 or 3 days before it runs out - call if I am sick and can't take my medicine - keep a list of all the medicines I take; vitamins and herbals too - use a pillbox to sort medicine    Why is this important?   . These steps will help you keep on track with your medicines.   Notes:     . Track and Manage My Symptoms-Asthma       Timeframe:  Long-Range Goal Priority:  High Start Date:    02/09/21                         Expected End Date:    08/17/21                   Follow Up Date 08/17/21   - avoid symptom triggers outdoors - begin a symptom diary - eliminate symptom triggers at home - keep follow-up appointments - keep rescue medicines on hand    Why is this important?    Keeping track of asthma symptoms can tell you a lot about your asthma control.   Based on symptoms and peak flow results you can see how well you are doing.   Your asthma action plan has a green, yellow and red zone. Green means all is good; it is your goal. Yellow means your symptoms are a little worse. You will need to adjust your medications. Being in the red zone means that your    asthma is out of control. You will need to use your rescue medicines. You may need emergency care.        Patient Care Plan: CCM Pharmacy Care Plan    Problem Identified: Hypertension, Hyperlipidemia, Diabetes, GERD, Asthma, Depression, Osteoarthritis and Neuropathy   Priority: High    Long-Range  Goal: Disease management   Start Date: 02/09/2021  Expected End Date: 02/09/2022  This Visit's Progress: On track  Priority: High  Note:   Current Barriers:  . Unable to independently monitor therapeutic efficacy  Pharmacist Clinical Goal(s):  [name] Over the next 180 days, patient will achieve adherence to monitoring guidelines and medication adherence to achieve therapeutic efficacy through collaboration with PharmD and provider.   Interventions: . 1:1 collaboration with Hoyt Koch, MD regarding development and update of comprehensive plan of care as evidenced by provider attestation and co-signature . Inter-disciplinary care team collaboration (see longitudinal plan of care) . Comprehensive medication review performed; medication list updated in electronic medical record  Hypertension (BP goal <130/80) -Controlled -Current treatment: . Irbesartan-HCTZ 300-12.5 mg daily . Metoprolol succinate 100 mg daily -Current  home readings: does not check -Current dietary habits: tuna salad, egg salad, taco salad, toast w/ peanut butter. She does try to limit salt. -Current exercise habits: walks her dog; used to do yoga -Denies hypotensive/hypertensive symptoms -Educated on BP goals and benefits of medications for prevention of heart attack, stroke and kidney damage; Daily salt intake goal < 2300 mg; Exercise goal of 150 minutes per week; -Counseled to monitor BP at home as needed -Counseled on diet and exercise extensively Recommended to continue current medication  Hyperlipidemia: (LDL goal < 100) -Controlled -Current treatment: . Rosuvastatin 20 mg daily -Educated on Cholesterol goals;  Benefits of statin for ASCVD risk reduction; Importance of limiting foods high in cholesterol; -Recommended to continue current medication  Asthma (Goal: control symptoms and prevent exacerbations) -Controlled - pt reports she had her heating/AC updated and since then asthma symptoms have  vastly improved. She has not been using Breo, and has not needed Proair much.  -Current treatment  . Breo Ellipta (fluticasone-vilanterol) 100-25 mg 1 puff daily . Albuterol HFA prn . Duoneb q6h PRN -Medications previously tried: Cherylynn Ridges -Pulmonary function testing (07/16/20): FEV1 103% predicted, +9% change -Exacerbations requiring treatment in last 6 months: none -Patient denies consistent use of maintenance inhaler -Frequency of rescue inhaler use: less than once a week -Counseled on Proper inhaler technique; Benefits of consistent maintenance inhaler use When to use rescue inhaler -Recommended to continue current medication Assessed patient finances. Pt reports [facility] was too expensive to refill in January. Explained this is due to deductible, once that is met copay will go back to ~$50/month  Depression / Insomnia (Goal: manage symptoms) -Not ideally controlled - pt reports medication helps but she still has problems falling sleep for several hours after going to bed -Current treatment: . Duloxetine 60 mg daily . Trazodone 50 mg HS prn -Medications previously tried/failed: Viibyrd, Belsomra, zolpidem, Fetzima -PHQ9: 0 (04/2020) -GAD7: not on file -Connected with Dr [facility] for mental health support -Educated on sleep hygiene and advised to avoid screens at least 30 min before bedtime -Recommended to take trazodone 30 minutes before bedtime -Recommended to continue current medication  Neuropathy / Osteoarthritis (Goal: manage pain) -Controlled - pt denies oversedation and reports pain is controlled as long as she can take meloxicam every day. -Current treatment  . Gabapentin 300 mg BID - takes at night only . Duloxetine 60 mg daily . Meloxicam 15 mg daily - wants 90 day supply -Educated on effect of meloxicam on kidney health; pt's kidney function has been normal, will continue to monitor -Recommended to continue current medication  Allergic rhinitis (Goal: manage  symptoms) -Controlled -Current treatment  . Fluticasone nasal spray PRN . Azelastine 0.05% eye drops . Mucinex 600 mg PRN -Patient is satisfied with current regimen and denies issues  -Recommended to continue current medication  Diabetes (A1c goal <7%) -Controlled - with diet/exercise -Current medications: . No medications -Denies hypoglycemic/hyperglycemic symptoms -Current meal patterns: eats frozen fruit now instead of ice cream -Current exercise:  -Educated onA1c and blood sugar goals; Complications of diabetes including kidney damage, retinal damage, and cardiovascular disease; Exercise goal of 150 minutes per week; Carbohydrate counting and/or plate method -Recommend to continue annual monitoring. If A1c rises > 7% medication would be indicated  Health Maintenance -Vaccine gaps: covid booster -Current therapy:  . Multivitamin . Preservision Areds . Vitamin D3 2000 IU . Vitamin B Complex mg daily . Diphenoxylate-atropine 2.5-0.025 mg PRN - 6 times a year  -Patient is satisfied with current therapy  and denies issues -Discussed fall prevention - advised to wear socks with grips on the bottom and rubber-soled shoes around the house. Also discussed benefits of PT to improve balance, pt will consider this and discuss with PCP at follow up -Recommended to continue current medication  Patient Goals/Self-Care Activities . Over the next 180 days, patient will:  - take medications as prescribed focus on medication adherence by pill box target a minimum of 150 minutes of moderate intensity exercise weekly engage in dietary modifications by reducing salt and carbohydrates in diet Wear socks/shoes with grips on the bottom to prevent falls  Follow Up Plan: Telephone follow up appointment with care management team member scheduled for: 6 months      Ms. Rodenbaugh was given information about Chronic Care Management services today including:  1. CCM service includes personalized  support from designated clinical staff supervised by her physician, including individualized plan of care and coordination with other care providers 2. 24/7 contact phone numbers for assistance for urgent and routine care needs. 3. Standard insurance, coinsurance, copays and deductibles apply for chronic care management only during months in which we provide at least 20 minutes of these services. Most insurances cover these services at 100%, however patients may be responsible for any copay, coinsurance and/or deductible if applicable. This service may help you avoid the need for more expensive face-to-face services. 4. Only one practitioner may furnish and bill the service in a calendar month. 5. The patient may stop CCM services at any time (effective at the end of the month) by phone call to the office staff.  Patient agreed to services and verbal consent obtained.   The patient verbalized understanding of instructions, educational materials, and care plan provided today and agreed to receive a mailed copy of patient instructions, educational materials, and care plan.  Telephone follow up appointment with pharmacy team member scheduled for: 6 months  Charlene Brooke, PharmD, BCACP Clinical Pharmacist [facility] [phone]  Fall Prevention in the Home, Adult Falls can cause injuries and can affect people from all age groups. There are many simple things that you can do to make your home safe and to help prevent falls. Ask for help when making these changes, if needed. What actions can I take to prevent falls? General instructions  Use good lighting in all rooms. Replace any light bulbs that burn out.  Turn on lights if it is dark. Use night-lights.  Place frequently used items in easy-to-reach places. Lower the shelves around your home if necessary.  Set up furniture so that there are clear paths around it. Avoid moving your furniture around.  Remove throw  rugs and other tripping hazards from the floor.  Avoid walking on wet floors.  Fix any uneven floor surfaces.  Add color or contrast paint or tape to grab bars and handrails in your home. Place contrasting color strips on the first and last steps of stairways.  When you use a stepladder, make sure that it is completely opened and that the sides are firmly locked. Have someone hold the ladder while you are using it. Do not climb a closed stepladder.  Be aware of any and all pets. What can I do in the bathroom?  Keep the floor dry. Immediately clean up any water that spills onto the floor.  Remove soap buildup in the tub or shower on a regular basis.  Use non-skid mats or decals on the floor of the tub or shower.  Attach bath mats securely with double-sided, non-slip rug tape.  If you need to sit down while you are in the shower, use a plastic, non-slip stool.  Install grab bars by the toilet and in the tub and shower. Do not use towel bars as grab bars.      What can I do in the bedroom?  Make sure that a bedside light is easy to reach.  Do not use oversized bedding that drapes onto the floor.  Have a firm chair that has side arms to use for getting dressed. What can I do in the kitchen?  Clean up any spills right away.  If you need to reach for something above you, use a sturdy step stool that has a grab bar.  Keep electrical cables out of the way.  Do not use floor polish or wax that makes floors slippery. If you must use wax, make sure that it is non-skid floor wax. What can I do in the stairways?  Do not leave any items on the stairs.  Make sure that you have a light switch at the top of the stairs and the bottom of the stairs. Have them installed if you do not have them.  Make sure that there are handrails on both sides of the stairs. Fix handrails that are broken or loose. Make sure that handrails are as long as the stairways.  Install non-slip stair treads on  all stairs in your home.  Avoid having throw rugs at the top or bottom of stairways, or secure the rugs with carpet tape to prevent them from moving.  Choose a carpet design that does not hide the edge of steps on the stairway.  Check any carpeting to make sure that it is firmly attached to the stairs. Fix any carpet that is loose or worn. What can I do on the outside of my home?  Use bright outdoor lighting.  Regularly repair the edges of walkways and driveways and fix any cracks.  Remove high doorway thresholds.  Trim any shrubbery on the main path into your home.  Regularly check that handrails are securely fastened and in good repair. Both sides of any steps should have handrails.  Install guardrails along the edges of any raised decks or porches.  Clear walkways of debris and clutter, including tools and rocks.  Have leaves, snow, and ice cleared regularly.  Use sand or salt on walkways during winter months.  In the garage, clean up any spills right away, including grease or oil spills. What other actions can I take?  Wear closed-toe shoes that fit well and support your feet. Wear shoes that have rubber soles or low heels.  Use mobility aids as needed, such as canes, walkers, scooters, and crutches.  Review your medicines with your health care provider. Some medicines can cause dizziness or changes in blood pressure, which increase your risk of falling. Talk with your health care provider about other ways that you can decrease your risk of falls. This may include working with a physical therapist or trainer to improve your strength, balance, and endurance. Where to find more information  Centers for Disease Control and Prevention, STEADI: WebmailGuide.co.za  [employer] on Aging: BrainJudge.co.uk Contact a health care provider if:  You are afraid of falling at home.  You feel weak, drowsy, or dizzy at home.  You fall at home. Summary  There  are many simple things that you can do to make your home safe and to help  prevent falls.  Ways to make your home safe include removing tripping hazards and installing grab bars in the bathroom.  Ask for help when making these changes in your home. This information is not intended to replace advice given to you by your health care provider. Make sure you discuss any questions you have with your health care provider. Document Revised: 11/16/2017 Document Reviewed: 07/19/2017 Elsevier Patient Education  2021 [employer].

## 2021-03-24 ENCOUNTER — Encounter: Payer: Self-pay | Admitting: Internal Medicine

## 2021-04-07 DIAGNOSIS — M5416 Radiculopathy, lumbar region: Secondary | ICD-10-CM | POA: Diagnosis not present

## 2021-04-28 DIAGNOSIS — M5416 Radiculopathy, lumbar region: Secondary | ICD-10-CM | POA: Diagnosis not present

## 2021-05-02 ENCOUNTER — Other Ambulatory Visit: Payer: Self-pay | Admitting: Internal Medicine

## 2021-05-02 ENCOUNTER — Other Ambulatory Visit: Payer: Self-pay

## 2021-05-02 ENCOUNTER — Ambulatory Visit (INDEPENDENT_AMBULATORY_CARE_PROVIDER_SITE_OTHER): Payer: Medicare Other | Admitting: Internal Medicine

## 2021-05-02 ENCOUNTER — Encounter: Payer: Self-pay | Admitting: Internal Medicine

## 2021-05-02 VITALS — BP 128/88 | HR 79 | Temp 99.0°F | Resp 18 | Ht 61.0 in | Wt 159.6 lb

## 2021-05-02 DIAGNOSIS — I700 Atherosclerosis of aorta: Secondary | ICD-10-CM | POA: Diagnosis not present

## 2021-05-02 DIAGNOSIS — Z0000 Encounter for general adult medical examination without abnormal findings: Secondary | ICD-10-CM

## 2021-05-02 DIAGNOSIS — F5101 Primary insomnia: Secondary | ICD-10-CM | POA: Diagnosis not present

## 2021-05-02 DIAGNOSIS — E785 Hyperlipidemia, unspecified: Secondary | ICD-10-CM | POA: Diagnosis not present

## 2021-05-02 DIAGNOSIS — I10 Essential (primary) hypertension: Secondary | ICD-10-CM | POA: Diagnosis not present

## 2021-05-02 DIAGNOSIS — E1169 Type 2 diabetes mellitus with other specified complication: Secondary | ICD-10-CM

## 2021-05-02 LAB — LIPID PANEL
Cholesterol: 154 mg/dL (ref 0–200)
HDL: 46.6 mg/dL (ref >39.00)
NonHDL: 107
Total CHOL/HDL Ratio: 3
Triglycerides: 300 mg/dL — ABNORMAL HIGH (ref 0.0–149.0)
VLDL: 60 mg/dL — ABNORMAL HIGH (ref 0.0–40.0)

## 2021-05-02 LAB — CBC
HCT: 39.3 % (ref 36.0–46.0)
Hemoglobin: 13.1 g/dL (ref 12.0–15.0)
MCHC: 33.2 g/dL (ref 30.0–36.0)
MCV: 83 fl (ref 78.0–100.0)
Platelets: 256 10*3/uL (ref 150.0–400.0)
RBC: 4.73 Mil/uL (ref 3.87–5.11)
RDW: 16.1 % — ABNORMAL HIGH (ref 11.5–15.5)
WBC: 8.9 10*3/uL (ref 4.0–10.5)

## 2021-05-02 LAB — COMPREHENSIVE METABOLIC PANEL
ALT: 32 U/L (ref 0–35)
AST: 20 U/L (ref 0–37)
Albumin: 4.4 g/dL (ref 3.5–5.2)
Alkaline Phosphatase: 84 U/L (ref 39–117)
BUN: 19 mg/dL (ref 6–23)
CO2: 32 mEq/L (ref 19–32)
Calcium: 9.9 mg/dL (ref 8.4–10.5)
Chloride: 101 mEq/L (ref 96–112)
Creatinine, Ser: 0.87 mg/dL (ref 0.40–1.20)
GFR: 64.59 mL/min (ref >60.00)
Glucose, Bld: 141 mg/dL — ABNORMAL HIGH (ref 70–99)
Potassium: 5.1 mEq/L (ref 3.5–5.1)
Sodium: 142 mEq/L (ref 135–145)
Total Bilirubin: 0.5 mg/dL (ref 0.2–1.2)
Total Protein: 7.1 g/dL (ref 6.0–8.3)

## 2021-05-02 LAB — MICROALBUMIN / CREATININE URINE RATIO
Creatinine,U: 61.3 mg/dL
Microalb Creat Ratio: 3.3 mg/g (ref 0.0–30.0)
Microalb, Ur: 2 mg/dL — ABNORMAL HIGH (ref 0.0–1.9)

## 2021-05-02 LAB — LDL CHOLESTEROL, DIRECT: Direct LDL: 84 mg/dL

## 2021-05-02 LAB — HEMOGLOBIN A1C: Hgb A1c MFr Bld: 7.9 % — ABNORMAL HIGH (ref 4.6–6.5)

## 2021-05-02 NOTE — Patient Instructions (Addendum)
Health Maintenance, Female Adopting a healthy lifestyle and getting preventive care are important in promoting health and wellness. Ask your health care provider about:  The right schedule for you to have regular tests and exams.  Things you can do on your own to prevent diseases and keep yourself healthy. What should I know about diet, weight, and exercise? Eat a healthy diet  Eat a diet that includes plenty of vegetables, fruits, low-fat dairy products, and lean protein.  Do not eat a lot of foods that are high in solid fats, added sugars, or sodium.   Maintain a healthy weight Body mass index (BMI) is used to identify weight problems. It estimates body fat based on height and weight. Your health care provider can help determine your BMI and help you achieve or maintain a healthy weight. Get regular exercise Get regular exercise. This is one of the most important things you can do for your health. Most adults should:  Exercise for at least 150 minutes each week. The exercise should increase your heart rate and make you sweat (moderate-intensity exercise).  Do strengthening exercises at least twice a week. This is in addition to the moderate-intensity exercise.  Spend less time sitting. Even light physical activity can be beneficial. Watch cholesterol and blood lipids Have your blood tested for lipids and cholesterol at 77 years of age, then have this test every 5 years. Have your cholesterol levels checked more often if:  Your lipid or cholesterol levels are high.  You are older than 77 years of age.  You are at high risk for heart disease. What should I know about cancer screening? Depending on your health history and family history, you may need to have cancer screening at various ages. This may include screening for:  Breast cancer.  Cervical cancer.  Colorectal cancer.  Skin cancer.  Lung cancer. What should I know about heart disease, diabetes, and high blood  pressure? Blood pressure and heart disease  High blood pressure causes heart disease and increases the risk of stroke. This is more likely to develop in people who have high blood pressure readings, are of African descent, or are overweight.  Have your blood pressure checked: ? Every 3-5 years if you are 18-39 years of age. ? Every year if you are 40 years old or older. Diabetes Have regular diabetes screenings. This checks your fasting blood sugar level. Have the screening done:  Once every three years after age 40 if you are at a normal weight and have a low risk for diabetes.  More often and at a younger age if you are overweight or have a high risk for diabetes. What should I know about preventing infection? Hepatitis B If you have a higher risk for hepatitis B, you should be screened for this virus. Talk with your health care provider to find out if you are at risk for hepatitis B infection. Hepatitis C Testing is recommended for:  Everyone born from 1945 through 1965.  Anyone with known risk factors for hepatitis C. Sexually transmitted infections (STIs)  Get screened for STIs, including gonorrhea and chlamydia, if: ? You are sexually active and are younger than 77 years of age. ? You are older than 77 years of age and your health care provider tells you that you are at risk for this type of infection. ? Your sexual activity has changed since you were last screened, and you are at increased risk for chlamydia or gonorrhea. Ask your health care provider   if you are at risk.  Ask your health care provider about whether you are at high risk for HIV. Your health care provider may recommend a prescription medicine to help prevent HIV infection. If you choose to take medicine to prevent HIV, you should first get tested for HIV. You should then be tested every 3 months for as long as you are taking the medicine. Pregnancy  If you are about to stop having your period (premenopausal) and  you may become pregnant, seek counseling before you get pregnant.  Take 400 to 800 micrograms (mcg) of folic acid every day if you become pregnant.  Ask for birth control (contraception) if you want to prevent pregnancy. Osteoporosis and menopause Osteoporosis is a disease in which the bones lose minerals and strength with aging. This can result in bone fractures. If you are 65 years old or older, or if you are at risk for osteoporosis and fractures, ask your health care provider if you should:  Be screened for bone loss.  Take a calcium or vitamin D supplement to lower your risk of fractures.  Be given hormone replacement therapy (HRT) to treat symptoms of menopause. Follow these instructions at home: Lifestyle  Do not use any products that contain nicotine or tobacco, such as cigarettes, e-cigarettes, and chewing tobacco. If you need help quitting, ask your health care provider.  Do not use street drugs.  Do not share needles.  Ask your health care provider for help if you need support or information about quitting drugs. Alcohol use  Do not drink alcohol if: ? Your health care provider tells you not to drink. ? You are pregnant, may be pregnant, or are planning to become pregnant.  If you drink alcohol: ? Limit how much you use to 0-1 drink a day. ? Limit intake if you are breastfeeding.  Be aware of how much alcohol is in your drink. In the [place] one drink equals one 12 oz bottle of beer (355 mL), one 5 oz glass of wine (148 mL), or one 1 oz glass of hard liquor (44 mL). General instructions  Schedule regular health, dental, and eye exams.  Stay current with your vaccines.  Tell your health care provider if: ? You often feel depressed. ? You have ever been abused or do not feel safe at home. Summary  Adopting a healthy lifestyle and getting preventive care are important in promoting health and wellness.  Follow your health care provider's instructions about healthy  diet, exercising, and getting tested or screened for diseases.  Follow your health care provider's instructions on monitoring your cholesterol and blood pressure. This information is not intended to replace advice given to you by your health care provider. Make sure you discuss any questions you have with your health care provider. Document Revised: 11/27/2018 Document Reviewed: 11/27/2018 Elsevier Patient Education  2021 [employer]  

## 2021-05-02 NOTE — Progress Notes (Signed)
Subjective:   Patient ID: Amber Roman, female    DOB: 1946/06/24, 75 y.o.   MRN: [id number]  HPI Here for medicare wellness, no new complaints. Please see A/P for status and treatment of chronic medical problems.  HPI #2: Here for follow up blood pressure (taking irbesartan/hctz and metoprolol and denies side effects, denies chest pains or headaches) and cholesterol (taking crestor, denies side effects, overall doing well, denies chest pains or stroke symptoms) and sleep (taking trazodone and this is helping well, we did start last year, no side effects, would like refill) and diabetes (discovered on labs last year, diet controlled currently, on ARB and statin, denies numbness in feet)  Diet: heart healthy Physical activity: sedentary Depression/mood screen: negative Hearing: intact to whispered voice Visual acuity: worsening, dry macular degeneration, performs annual eye exam  ADLs: capable Fall risk: none Home safety: good Cognitive evaluation: intact to orientation, naming, recall and repetition EOL planning: adv directives discussed, in place  [employer] Visit from 05/02/2021 in [place] at [facility] Total Score 0      I have personally reviewed and have noted 1. The patient's medical and social history - reviewed today no changes 2. Their use of alcohol, tobacco or illicit drugs 3. Their current medications and supplements 4. The patient's functional ability including ADL's, fall risks, home safety risks and hearing or visual impairment. 5. Diet and physical activities 6. Evidence for depression or mood disorders 7. Care team reviewed and updated 8.  The patient is not on an opioid pain medication.  Patient Care Team: Hoyt Koch, MD as PCP - General (Internal Medicine) Lorretta Harp, MD as PCP - Cardiology (Cardiology) Charlton Haws, [facility] as Pharmacist (Pharmacist) Past Medical History:  Diagnosis Date  . Allergic  conjunctivitis   . Arthritis   . Basal cell carcinoma 10/29/1997   ledt nasal ala (MOHS)  . Bronchitis   . Bruxism, sleep-related 04/13/2014  . Depression   . Esophageal reflux   . Family history of heart disease   . H/O abdominal hysterectomy    w/o recurrence  . Hyperlipidemia   . Hypertension   . Insomnia 04/13/2014  . Obstructive sleep apnea    improved on C Pap  . Squamous cell carcinoma of skin 09/20/2015   left upper arm (Txpbx)  . Uterine cancer [facility])    hysterectomy   Past Surgical History:  Procedure Laterality Date  . BILATERAL SALPINGOOPHORECTOMY    . CARPAL TUNNEL RELEASE     bilateral  . DOPPLER ECHOCARDIOGRAPHY    . NASAL SEPTUM SURGERY    . NM MYOVIEW LTD    . ROTATOR CUFF REPAIR Right   . TOTAL ABDOMINAL HYSTERECTOMY    . TOTAL HIP ARTHROPLASTY  01/10/2013   Procedure: TOTAL HIP ARTHROPLASTY ANTERIOR APPROACH;  Surgeon: Mcarthur Rossetti, MD;  Location: [facility];  Service: Orthopedics;  Laterality: Right;   Family History  Problem Relation Age of Onset  . Heart attack Father   . High blood pressure Father   . Multiple sclerosis Father   . Lung disease Mother   . Colon cancer Brother   . High blood pressure Brother   . Diabetes Mellitus II Sister   . High blood pressure Sister    Review of Systems  Constitutional: Negative.   HENT: Negative.   Eyes: Positive for visual disturbance.  Respiratory: Negative for cough, chest tightness and shortness of breath.   Cardiovascular: Negative for chest pain,  palpitations and leg swelling.  Gastrointestinal: Negative for abdominal distention, abdominal pain, constipation, diarrhea, nausea and vomiting.  Musculoskeletal: Negative.   Skin: Negative.   Neurological: Negative.   Psychiatric/Behavioral: Negative.     Objective:  Physical Exam Constitutional:      Appearance: She is well-developed.  HENT:     Head: Normocephalic and atraumatic.  Cardiovascular:     Rate and Rhythm: Normal rate and  regular rhythm.  Pulmonary:     Effort: Pulmonary effort is normal. No respiratory distress.     Breath sounds: Normal breath sounds. No wheezing or rales.  Abdominal:     General: Bowel sounds are normal. There is no distension.     Palpations: Abdomen is soft.     Tenderness: There is no abdominal tenderness. There is no rebound.  Musculoskeletal:     Cervical back: Normal range of motion.  Skin:    General: Skin is warm and dry.     Comments: Foot exam done  Neurological:     Mental Status: She is alert and oriented to person, place, and time.     Coordination: Coordination normal.     Vitals:   05/02/21 1252  BP: 128/88  Pulse: 79  Resp: 18  Temp: 99 F (37.2 C)  TempSrc: Oral  SpO2: 97%  Weight: 159 lb 9.6 oz (72.4 kg)  Height: 5\' 1"  (1.549 m)   This visit occurred during the SARS-CoV-2 public health emergency.  Safety protocols were in place, including screening questions prior to the visit, additional usage of staff PPE, and extensive cleaning of exam room while observing appropriate contact time as indicated for disinfecting solutions.   Assessment & Plan:

## 2021-05-03 DIAGNOSIS — Z23 Encounter for immunization: Secondary | ICD-10-CM | POA: Diagnosis not present

## 2021-05-03 DIAGNOSIS — H353133 Nonexudative age-related macular degeneration, bilateral, advanced atrophic without subfoveal involvement: Secondary | ICD-10-CM | POA: Diagnosis not present

## 2021-05-03 DIAGNOSIS — L299 Pruritus, unspecified: Secondary | ICD-10-CM | POA: Diagnosis not present

## 2021-05-04 DIAGNOSIS — I700 Atherosclerosis of aorta: Secondary | ICD-10-CM | POA: Insufficient documentation

## 2021-05-04 DIAGNOSIS — E119 Type 2 diabetes mellitus without complications: Secondary | ICD-10-CM | POA: Insufficient documentation

## 2021-05-04 NOTE — Assessment & Plan Note (Signed)
Foot exam done, checking HgA1c and microalbumin to creatinine ratio. On ARB and statin. Diet controlled currently. Discovered last year on labs.

## 2021-05-04 NOTE — Assessment & Plan Note (Signed)
Noted on CT abdomen 2015 and is on crestor 20 mg daily and will continue. Checking lipid panel today.

## 2021-05-04 NOTE — Assessment & Plan Note (Signed)
BP at goal on irbesartan/hctz and metoprolol. Checking CMP and adjust as needed.

## 2021-05-04 NOTE — Assessment & Plan Note (Signed)
Taking trazodone and this is working well for sleep. Will refill as needed.

## 2021-05-04 NOTE — Assessment & Plan Note (Signed)
Flu shot yearly. Covid-19 3 shots scheduled for 4th shot per patient request as she cannot see well enough to schedule online herself. Pneumonia complete. Shingrix counseled to get at pharmacy. Tetanus due 2024. Colonoscopy aged out prior to recall. Mammogram aged out, pap smear aged out and dexa due 2024. Counseled about sun safety and mole surveillance. Counseled about the dangers of distracted driving. Given 10 year screening recommendations.

## 2021-05-04 NOTE — Progress Notes (Signed)
HPI  female former smoker followed for asthma/bronchitis, rhinitis, allergic conjunctivitis, complicated by OSA ([facility]/ Dr Brett Fairy), GERD PFT 09/17/17-minimal diffusion defect.  FVC 2.50/96%, FEV1 2.13/109%, ratio 0.85, FEF 25-75% 2.63/163%, no response to dilator.  TLC 92%, DLCO 77%.  ---------------------------------------------------------------------------   05/11/20- 75 year old female former smoker followed for asthma/bronchitis, rhinitis, allergic conjunctivitis, complicated by OSA ([facility]), GERD CPAP 7/ managed by Neurology Dr Brett Fairy. Trazodone from PCP helps occ insomnia.   Breo 100, Proair hfa, flonase -----SOB/ wheezing/ cough are worse since last visit  Anoro too expensive. Breo usually good. Did well in [place] late winter, leaving before hot weather. Did well in [place]. Got back here and over last week says with heat and humidity she is coughing yellow beige with no fever or sore throat. Using rescue inhaler 2-3x/ day. Had 2 Moderna Covax. She asks I refill lomotil for occ use- thinks I filled it before.   05/05/21- 75 year old female former smoker followed for asthma/bronchitis, rhinitis, allergic conjunctivitis, complicated by OSA ([facility]), GERD, HTN,  CPAP 7/ managed by Neurology Dr Brett Fairy. Trazodone from PCP helps occ insomnia.  - Breo 100, Proair hfa, flonase, Trazodone -----Wheezing. Memory Dance is 400 a month, needs to switch Download- compliance 100%, AHI 2.2/ hr Body weight today- 160 lbs Noting a little wheeze. Spent winter in [place] then plans [place] go [place] for summer. Asks update CXR for watch due to hx smoking with no new symptom concern. CXR 05/11/20-  IMPRESSION: No radiographic evidence of acute cardiopulmonary disease.   ROS-see HPI + = positive Constitutional:   No-   weight loss, night sweats, fevers, chills, fatigue, lassitude. HEENT:   No-  headaches, difficulty swallowing, tooth/dental problems, +sore throat,     No- sneezing, itching, ear ache,              nasal congestion, post nasal drip,  CV:  No-   chest pain, orthopnea, PND, swelling in lower extremities, anasarca, dizziness, palpitations Resp: No-   shortness of breath with exertion or at rest.                No- coughing up of blood,  productive cough, +nonproductive cough            change in color of mucus.  + Occasional wheezing.   Skin: No-   rash or lesions. GI:  No-   heartburn, indigestion, abdominal pain, nausea, vomiting,  GU:   Neuro-     nothing unusual Psych:  No- change in mood or affect. No depression or anxiety.  No memory loss.  OBJ General- Alert, Oriented, Affect-appropriate, Distress- none acute, + overweight Skin- rash-none, lesions- none, excoriation- none Lymphadenopathy- none Head- atraumatic            Eyes- Gross vision intact, PERRLA, conjunctivae look pale, clear secretions            Ears- Hearing,             Nose- , no-Septal dev, mucus, polyps, erosion, perforation             Throat- Mallampati III , mucosa clear , drainage- none, tonsils- atrophic Neck- flexible , trachea midline, no stridor , thyroid nl, carotid no bruit Chest - symmetrical excursion , unlabored           Heart/CV- RRR , no murmur , no gallop  , no rub, nl s1 s2                           -  JVD- none , edema- none, stasis changes- none, varices- none           Lung-  Wheeze+ light, cough-none, dullness-none, rub- none           Chest wall-  Abd-  Br/ Gen/ Rectal- Not done, not indicated Extrem- cyanosis- none, clubbing, none, atrophy- none, strength- nl Neuro- grossly intact to observation. No nystagmus, and no carotid bruit.

## 2021-05-04 NOTE — Assessment & Plan Note (Signed)
Checking lipid panel and adjust crestor 20 mg daily as needed.  

## 2021-05-05 ENCOUNTER — Encounter: Payer: Self-pay | Admitting: Internal Medicine

## 2021-05-05 ENCOUNTER — Ambulatory Visit (INDEPENDENT_AMBULATORY_CARE_PROVIDER_SITE_OTHER): Payer: Medicare Other | Admitting: Internal Medicine

## 2021-05-05 ENCOUNTER — Ambulatory Visit (INDEPENDENT_AMBULATORY_CARE_PROVIDER_SITE_OTHER): Payer: Medicare Other

## 2021-05-05 ENCOUNTER — Other Ambulatory Visit: Payer: Self-pay

## 2021-05-05 VITALS — BP 154/80 | HR 70 | Temp 98.0°F | Ht 61.0 in | Wt 160.0 lb

## 2021-05-05 DIAGNOSIS — J4540 Moderate persistent asthma, uncomplicated: Secondary | ICD-10-CM | POA: Diagnosis not present

## 2021-05-05 DIAGNOSIS — Z87891 Personal history of nicotine dependence: Secondary | ICD-10-CM

## 2021-05-05 DIAGNOSIS — G4733 Obstructive sleep apnea (adult) (pediatric): Secondary | ICD-10-CM | POA: Diagnosis not present

## 2021-05-05 DIAGNOSIS — R918 Other nonspecific abnormal finding of lung field: Secondary | ICD-10-CM | POA: Diagnosis not present

## 2021-05-05 DIAGNOSIS — J4520 Mild intermittent asthma, uncomplicated: Secondary | ICD-10-CM | POA: Insufficient documentation

## 2021-05-05 DIAGNOSIS — Z9989 Dependence on other enabling machines and devices: Secondary | ICD-10-CM

## 2021-05-05 MED ORDER — BUDESONIDE-FORMOTEROL FUMARATE 160-4.5 MCG/ACT IN AERO: INHALATION_SPRAY | RESPIRATORY_TRACT | 12 refills | Status: DC

## 2021-05-05 NOTE — Assessment & Plan Note (Signed)
Managed by Neurology

## 2021-05-05 NOTE — Patient Instructions (Signed)
Script ent for Symbicort maintenance inhaler   Inhale 2 puffs then rinse mouth, twice daily. This would be instead of Breo or Anoro.  If Symbicort is to expensive, please ask your drug store/ insurance what would be cheaper.  Order- CXR  Dx former Smoker  Suggest you get flu vaccine in the Fall- you can call around mid-Se[tember to see if we have it.

## 2021-05-05 NOTE — Assessment & Plan Note (Signed)
Should continue with a maintenance controller. Plan- replace expensive Breo with Symbicort. Recommended flu vaccine in the Fall.

## 2021-05-05 NOTE — Assessment & Plan Note (Signed)
Plan- update CXR 

## 2021-05-06 ENCOUNTER — Telehealth: Payer: Self-pay | Admitting: Internal Medicine

## 2021-05-06 ENCOUNTER — Other Ambulatory Visit: Payer: Self-pay

## 2021-05-06 ENCOUNTER — Telehealth: Payer: Self-pay

## 2021-05-06 DIAGNOSIS — J4540 Moderate persistent asthma, uncomplicated: Secondary | ICD-10-CM

## 2021-05-06 DIAGNOSIS — R9389 Abnormal findings on diagnostic imaging of other specified body structures: Secondary | ICD-10-CM

## 2021-05-06 MED ORDER — ADVAIR HFA 115-21 MCG/ACT IN AERO: 2.0000 | INHALATION_SPRAY | Freq: Two times a day (BID) | RESPIRATORY_TRACT | 12 refills | Status: DC

## 2021-05-06 NOTE — Telephone Encounter (Signed)
Jane with Radiology calling about cxr dated 05/06/21    IMPRESSION: Interval development of patchy opacities at the lung bases, 1 of which appears somewhat nodular on the right side. Further evaluation with chest CT should be performed to exclude underlying mass.  These results will be called to the ordering clinician or representative by the Radiologist Assistant, and communication documented in the PACS or [employer].   Electronically Signed   By: Miachel Roux M.D.   On: 05/06/2021 13:52  Forwarding to Dr Annamaria Boots

## 2021-05-06 NOTE — Telephone Encounter (Signed)
Please change Symbicort to Advair hfa 115/21   # 1, ref x 12 (or # 3, ref x 4 if 90 day)    Inhale 2 puffs then rinse mouth, twice daily

## 2021-05-06 NOTE — Telephone Encounter (Signed)
Created in error

## 2021-05-06 NOTE — Telephone Encounter (Signed)
Rx for Advair sent into [facility] pharmacy.

## 2021-05-06 NOTE — Progress Notes (Signed)
DONE- see phone note dated 05/06/21

## 2021-05-06 NOTE — Telephone Encounter (Signed)
Rx for Symbicort is unfortunately not covered by the pt's insurance. However the following inhalers are: Advair Diskus, Advair HFA, Breo Ellipta. How would you like to go about this?

## 2021-05-06 NOTE — Telephone Encounter (Signed)
Deneise Lever, MD  P [facility] Triage Pool CXR- there are shadows in both lung bases that could be left over from pneumonia. One of these may be a nodule.  Radiology recommends a CT scan to look at it more closely.   Order- CT chest, no contrast  dx abnormal CXR    Spoke with the pt and notified of results/recs per CDY  She verbalized understanding She was agreeable to CT Chest and I have ordered this  Nothing further needed

## 2021-05-06 NOTE — Telephone Encounter (Signed)
I just resulted that, asking order for chest CT

## 2021-05-06 NOTE — Telephone Encounter (Signed)
See result note.  

## 2021-05-06 NOTE — Telephone Encounter (Signed)
    Please call patient to discuss lab results 

## 2021-05-09 ENCOUNTER — Encounter: Payer: Self-pay | Admitting: Adult Health

## 2021-05-09 ENCOUNTER — Ambulatory Visit (INDEPENDENT_AMBULATORY_CARE_PROVIDER_SITE_OTHER): Payer: Medicare Other | Admitting: Adult Health

## 2021-05-09 VITALS — BP 147/75 | HR 74 | Ht 61.0 in | Wt 160.0 lb

## 2021-05-09 DIAGNOSIS — Z9989 Dependence on other enabling machines and devices: Secondary | ICD-10-CM | POA: Diagnosis not present

## 2021-05-09 DIAGNOSIS — G629 Polyneuropathy, unspecified: Secondary | ICD-10-CM

## 2021-05-09 DIAGNOSIS — G4733 Obstructive sleep apnea (adult) (pediatric): Secondary | ICD-10-CM | POA: Diagnosis not present

## 2021-05-09 NOTE — Patient Instructions (Signed)
Your Plan:  Continue using CPAP If your symptoms worsen or you develop new symptoms please let us know.    Thank you for coming to see us at [facility]. I hope we have been able to provide you high quality care today.  You may receive a patient satisfaction survey over the next few weeks. We would appreciate your feedback and comments so that we may continue to improve ourselves and the health of our patients.  

## 2021-05-09 NOTE — Progress Notes (Signed)
PATIENT: Amber Roman DOB: Apr 23, 1946  REASON FOR VISIT: follow up HISTORY FROM: patient  HISTORY OF PRESENT ILLNESS: Today 05/09/21:  Ms. Matto is a 75 year old female with a history of obstructive sleep apnea on CPAP.  She returns today for follow-up.  Overall she feels that the CPAP works well for her.  She denies any new issues.  Her download is below.  Neuropathy: Patient reports that she does not have any pain in the hands or legs.  Denies any numbness.  She states on the right foot she feels as if she has a "duck" foot.  She states that it feels as if her first 3 toes are connected together.  Other than that she does not have any other symptoms.  No change in her gait or balance.  She states that the symptoms have not progressed over the last 5 years.  She is on Cymbalta prescribed by Dr. Robina Ade     REVIEW OF SYSTEMS: Out of a complete 14 system review of symptoms, the patient complains only of the following symptoms, and all other reviewed systems are negative.  FSS 35 ESS 1  ALLERGIES: Allergies  Allergen Reactions  . Sulfonamide Derivatives Other (See Comments)    Childhood allergy     HOME MEDICATIONS: Outpatient Medications Prior to Visit  Medication Sig Dispense Refill  . albuterol (PROAIR HFA) 108 (90 Base) MCG/ACT inhaler INHALE 2 PUFFS INTO THE LUNGS EVERY 6 HOURS AS NEEDED FOR WHEEZING OR SHORTNESS OF BREATH 18 g 12  . azelastine (OPTIVAR) 0.05 % ophthalmic solution Place 1 drop into both eyes 2 (two) times daily. 6 mL 3  . b complex vitamins capsule Take 1 capsule by mouth daily.    . budesonide-formoterol (SYMBICORT) 160-4.5 MCG/ACT inhaler Inhale 2 puffs then rinse mouth, twice daily 1 each 12  . diphenoxylate-atropine (LOMOTIL) 2.5-0.025 MG tablet Take 1 tablet by mouth 4 (four) times daily as needed. For stomach 30 tablet 5  . DULoxetine (CYMBALTA) 60 MG capsule Take 1 capsule by mouth daily.  1  . fluticasone (FLONASE) 50 MCG/ACT nasal spray 1-2 puffs  each nostril once daily while needed 16 g prn  . fluticasone-salmeterol (ADVAIR HFA) 115-21 MCG/ACT inhaler Inhale 2 puffs into the lungs 2 (two) times daily. 1 each 12  . gabapentin (NEURONTIN) 300 MG capsule TAKE 1 CAPSULE BY MOUTH TWICE DAILY. ANNUAL APPOINTMENT DUE IN MAY, MUST SEE PROVIDER FOR FUTURE REFILLS (Patient taking differently: Take 300 mg by mouth at bedtime.) 180 capsule 0  . guaiFENesin (MUCINEX) 600 MG 12 hr tablet Take 1,200 mg by mouth 2 (two) times daily as needed. For chest tightness    . ipratropium-albuterol (DUONEB) 0.5-2.5 (3) MG/3ML SOLN Take 3 mLs by nebulization every 6 (six) hours as needed. 75 mL 12  . irbesartan-hydrochlorothiazide (AVALIDE) 300-12.5 MG tablet TAKE 1 TABLET BY MOUTH DAILY 90 tablet 3  . meloxicam (MOBIC) 15 MG tablet TAKE 1 TABLET(15 MG) BY MOUTH DAILY 90 tablet 0  . metoprolol succinate (TOPROL-XL) 100 MG 24 hr tablet TAKE 1 TABLET(100 MG) BY MOUTH DAILY 90 tablet 1  . Multiple Vitamin (MULTIVITAMIN) tablet Take 1 tablet by mouth daily.    . Multiple Vitamins-Minerals (PRESERVISION AREDS PO) Take by mouth daily.    . Multiple Vitamins-Minerals (VITAMIN D3 COMPLETE PO) Take 50 mcg by mouth daily.    . Nebulizers (COMPRESSOR NEBULIZER) MISC As directed 1 each 0  . rosuvastatin (CRESTOR) 20 MG tablet TAKE 1 TABLET(20 MG) BY MOUTH DAILY 90  tablet 3  . traZODone (DESYREL) 50 MG tablet TAKE 1 TABLET(50 MG) BY MOUTH AT BEDTIME AS NEEDED FOR SLEEP 30 tablet 5   No facility-administered medications prior to visit.    PAST MEDICAL HISTORY: Past Medical History:  Diagnosis Date  . Allergic conjunctivitis   . Arthritis   . Basal cell carcinoma 10/29/1997   ledt nasal ala (MOHS)  . Bronchitis   . Bruxism, sleep-related 04/13/2014  . Depression   . Esophageal reflux   . Family history of heart disease   . H/O abdominal hysterectomy    w/o recurrence  . Hyperlipidemia   . Hypertension   . Insomnia 04/13/2014  . Obstructive sleep apnea    improved  on C Pap  . Squamous cell carcinoma of skin 09/20/2015   left upper arm (Txpbx)  . Uterine cancer [facility])    hysterectomy    PAST SURGICAL HISTORY: Past Surgical History:  Procedure Laterality Date  . BILATERAL SALPINGOOPHORECTOMY    . CARPAL TUNNEL RELEASE     bilateral  . DOPPLER ECHOCARDIOGRAPHY    . NASAL SEPTUM SURGERY    . NM MYOVIEW LTD    . ROTATOR CUFF REPAIR Right   . TOTAL ABDOMINAL HYSTERECTOMY    . TOTAL HIP ARTHROPLASTY  01/10/2013   Procedure: TOTAL HIP ARTHROPLASTY ANTERIOR APPROACH;  Surgeon: Mcarthur Rossetti, MD;  Location: [facility];  Service: Orthopedics;  Laterality: Right;    FAMILY HISTORY: Family History  Problem Relation Age of Onset  . Heart attack Father   . High blood pressure Father   . Multiple sclerosis Father   . Lung disease Mother   . Colon cancer Brother   . High blood pressure Brother   . Diabetes Mellitus II Sister   . High blood pressure Sister     SOCIAL HISTORY: Social History   Socioeconomic History  . Marital status: Single    Spouse name: Not on file  . Number of children: 1  . Years of education: College  . Highest education level: Not on file  Occupational History  . Not on file  Tobacco Use  . Smoking status: Former Smoker    Packs/day: 2.00    Years: 15.00    Pack years: 30.00    Types: Cigarettes    Quit date: 12/18/1978    Years since quitting: 42.4  . Smokeless tobacco: Never Used  Vaping Use  . Vaping Use: Never used  Substance and Sexual Activity  . Alcohol use: No  . Drug use: No    Comment: hx of marijuana use   . Sexual activity: Not on file  Other Topics Concern  . Not on file  Social History Narrative   Patient is single.   Patient has a college education.   Patient drinks one caffeine drink per day.   Patient is right-handed.   Patient has one child.         Social Determinants of Health   Financial Resource Strain: Low Risk   . Difficulty of Paying Living Expenses: Not very hard  Food  Insecurity: Not on file  Transportation Needs: Not on file  Physical Activity: Not on file  Stress: Not on file  Social Connections: Not on file  Intimate Partner Violence: Not on file      PHYSICAL EXAM  Vitals:   05/09/21 0846  BP: (!) 147/75  Pulse: 74  Weight: 160 lb (72.6 kg)  Height: 5\' 1"  (1.549 m)   Body mass index is 30.23  kg/m.  Generalized: Well developed, in no acute distress  Chest: Lungs clear to auscultation bilaterally  Neurological examination  Mentation: Alert oriented to time, place, history taking. Follows all commands speech and language fluent Cranial nerve II-XII: Extraocular movements were full, visual field were full on confrontational test Head turning and shoulder shrug  were normal and symmetric. Motor: The motor testing reveals 5 over 5 strength of all 4 extremities. Good symmetric motor tone is noted throughout.  Sensory: Sensory testing is intact to soft touch on all 4 extremities.  Pinprick and vibration sensation intact in all 4 extremities no evidence of extinction is noted.  Gait and station: Gait is normal.    DIAGNOSTIC DATA (LABS, IMAGING, TESTING) - I reviewed patient records, labs, notes, testing and imaging myself where available.  Lab Results  Component Value Date   WBC 8.9 05/02/2021   HGB 13.1 05/02/2021   HCT 39.3 05/02/2021   MCV 83.0 05/02/2021   PLT 256.0 05/02/2021      Component Value Date/Time   NA 142 05/02/2021 1324   NA 141 09/19/2017 0000   K 5.1 05/02/2021 1324   CL 101 05/02/2021 1324   CO2 32 05/02/2021 1324   GLUCOSE 141 (H) 05/02/2021 1324   BUN 19 05/02/2021 1324   BUN 22 (A) 09/19/2017 0000   CREATININE 0.87 05/02/2021 1324   CALCIUM 9.9 05/02/2021 1324   PROT 7.1 05/02/2021 1324   PROT 7.1 07/22/2019 1158   ALBUMIN 4.4 05/02/2021 1324   ALBUMIN 5.0 (H) 07/22/2019 1158   AST 20 05/02/2021 1324   ALT 32 05/02/2021 1324   ALKPHOS 84 05/02/2021 1324   BILITOT 0.5 05/02/2021 1324   BILITOT 0.3  07/22/2019 1158   GFRNONAA >90 01/11/2013 0444   GFRAA >90 01/11/2013 0444   Lab Results  Component Value Date   CHOL 154 05/02/2021   HDL 46.60 05/02/2021   LDLCALC 72 04/28/2020   LDLDIRECT 84.0 05/02/2021   TRIG 300.0 (H) 05/02/2021   CHOLHDL 3 05/02/2021   Lab Results  Component Value Date   HGBA1C 7.9 (H) 05/02/2021   No results found for: VITAMINB12 Lab Results  Component Value Date   TSH 3.59 09/19/2017      ASSESSMENT AND PLAN 75 y.o. year old female  has a past medical history of Allergic conjunctivitis, Arthritis, Basal cell carcinoma (10/29/1997), Bronchitis, Bruxism, sleep-related (04/13/2014), Depression, Esophageal reflux, Family history of heart disease, H/O abdominal hysterectomy, Hyperlipidemia, Hypertension, Insomnia (04/13/2014), Obstructive sleep apnea, Squamous cell carcinoma of skin (09/20/2015), and Uterine cancer ([place]). here with:  1. OSA on CPAP  - CPAP compliance excellent - Good treatment of AHI  - Encourage patient to use CPAP nightly and > 4 hours each night  2.  Neuropathy?  -Diabetes currently being managed by PCP -Defers on nerve conduction testing. -Advised if symptoms worsen or she develops new symptoms she should let us know   - F/U in 1 year or sooner if needed     Ward Givens, MSN, NP-C 05/09/2021, 8:55 AM [facility] [address] [place], [place] [address] [phone]

## 2021-05-10 ENCOUNTER — Other Ambulatory Visit: Payer: Self-pay

## 2021-05-10 ENCOUNTER — Encounter: Payer: Self-pay | Admitting: Physician Assistant

## 2021-05-10 ENCOUNTER — Ambulatory Visit (INDEPENDENT_AMBULATORY_CARE_PROVIDER_SITE_OTHER): Payer: Medicare Other | Admitting: Physician Assistant

## 2021-05-10 DIAGNOSIS — Z1283 Encounter for screening for malignant neoplasm of skin: Secondary | ICD-10-CM | POA: Diagnosis not present

## 2021-05-10 DIAGNOSIS — L570 Actinic keratosis: Secondary | ICD-10-CM

## 2021-05-10 DIAGNOSIS — L821 Other seborrheic keratosis: Secondary | ICD-10-CM

## 2021-05-10 NOTE — Progress Notes (Signed)
   Follow-Up Visit   Subjective  Amber Roman is a 75 y.o. female who presents for the following: Annual Exam (Patient here today for yearly skin check. Per patient her only concern is that some of the Sk's under her breast are getting larger, no bleeding. Personal history of non mole skin cancer. No personal history of atypical moles, or melanoma. No family history of atypical moles, melanoma or non mole skin cancer.).   The following portions of the chart were reviewed this encounter and updated as appropriate:  Tobacco  Allergies  Meds  Problems  Med Hx  Surg Hx  Fam Hx      Objective  Well appearing patient in no apparent distress; mood and affect are within normal limits.  A full examination was performed including scalp, head, eyes, ears, nose, lips, neck, chest, axillae, abdomen, back, buttocks, bilateral upper extremities, bilateral lower extremities, hands, feet, fingers, toes, fingernails, and toenails. All findings within normal limits unless otherwise noted below.  Objective  Right Upper Arm - Posterior: Erythematous patches with gritty scale.Erythematous patches with gritty scale.  Objective  Mid Back: Stuck-on, waxy, tan-brown plaques. --Discussed benign etiology and prognosis.    Assessment & Plan  AK (actinic keratosis) Right Upper Arm - Posterior  Destruction of lesion - Right Upper Arm - Posterior Complexity: simple   Destruction method: cryotherapy   Informed consent: discussed and consent obtained   Timeout:  patient name, date of birth, surgical site, and procedure verified Lesion destroyed using liquid nitrogen: Yes   Cryotherapy cycles:  1 Outcome: patient tolerated procedure well with no complications   Post-procedure details: wound care instructions given    Seborrheic keratosis Mid Back  Benign no treatment needed. RTC if changes    I, Vegas Coffin, PA-C, have reviewed all documentation's for this visit.  The documentation on 05/10/21  for the exam, diagnosis, procedures and orders are all accurate and complete.

## 2021-05-11 ENCOUNTER — Other Ambulatory Visit: Payer: Self-pay | Admitting: Internal Medicine

## 2021-05-11 ENCOUNTER — Ambulatory Visit (INDEPENDENT_AMBULATORY_CARE_PROVIDER_SITE_OTHER): Payer: Medicare Other | Admitting: Cardiovascular Disease

## 2021-05-11 ENCOUNTER — Encounter: Payer: Self-pay | Admitting: Cardiovascular Disease

## 2021-05-11 ENCOUNTER — Telehealth: Payer: Self-pay | Admitting: Internal Medicine

## 2021-05-11 VITALS — BP 130/64 | HR 75 | Ht 61.0 in | Wt 160.2 lb

## 2021-05-11 DIAGNOSIS — Z9989 Dependence on other enabling machines and devices: Secondary | ICD-10-CM | POA: Diagnosis not present

## 2021-05-11 DIAGNOSIS — I10 Essential (primary) hypertension: Secondary | ICD-10-CM | POA: Diagnosis not present

## 2021-05-11 DIAGNOSIS — E782 Mixed hyperlipidemia: Secondary | ICD-10-CM | POA: Diagnosis not present

## 2021-05-11 DIAGNOSIS — G4733 Obstructive sleep apnea (adult) (pediatric): Secondary | ICD-10-CM

## 2021-05-11 DIAGNOSIS — M25512 Pain in left shoulder: Secondary | ICD-10-CM | POA: Diagnosis not present

## 2021-05-11 DIAGNOSIS — M170 Bilateral primary osteoarthritis of knee: Secondary | ICD-10-CM | POA: Diagnosis not present

## 2021-05-11 MED ORDER — METFORMIN HCL 500 MG PO TABS: 500.0000 mg | ORAL_TABLET | Freq: Every day | ORAL | 3 refills | Status: DC

## 2021-05-11 NOTE — Assessment & Plan Note (Signed)
History of hyperlipidemia on statin therapy with lipid profile performed 05/02/2021 revealing total cholesterol 154, LDL 72 and HDL 46.

## 2021-05-11 NOTE — Assessment & Plan Note (Signed)
History of obstructive sleep apnea on CPAP. 

## 2021-05-11 NOTE — Telephone Encounter (Signed)
Spoke with the pt  She states [facility] was supposed to fax [place] a form regarding her advair  I checked up front, C pod cabinet and CDY's desk in C pod and nothing on her  I made her aware of this and she stated that she would call them back and find out what happened  Will keep msg open to f/u later

## 2021-05-11 NOTE — Progress Notes (Signed)
05/11/2021 Amber Roman   Feb 11, 1946  [id number]  Primary Physician Hoyt Koch, MD Primary Cardiologist: Lorretta Harp MD Lupe Carney, [place]  HPI:  ALEYZA Roman is a 75 y.o.   fit-appearing, divorced Caucasian female, mother of a 44 year old [place] adopted daughter named Amber Roman who she adopted when she was 75 years old andhas graduated from [employer]. Shecurrently lives in [place] and owns an indoor plant shop... I last saw herin the office  07/22/2019. She has a history of hypertension, hyperlipidemia, and a strong family history for heart disease with a father who had his first MI at age 68 and died 18 years later of a massive myocardial infarction. Her last Myoview stress test performed 10/25/12 was not ischemic.She has never had a heart attack or stroke, otherwise is asymptomatic. Her other problems include uterine cancer having undergone a total abdominal hysterectomy without recurrence 12 years ago. Dr. Dagmar Hait follows her lipid profile. She had an uncomplicated total hip replacement by Dr. Zollie Beckers 01/09/2013. Since I saw her back she denies chest painbut does complain of occasional shortness of breath on exertion. She had negative breathing studies by Dr. Annamaria Boots recently.She was diagnosed obstructive sleep apnea by Dr. Fernande Bras and has been a remarkable responder to CPAP.  Since I saw her  2 years ago she remained stable.  She does have some nodules on chest x-ray that is being followed by Dr. Annamaria Boots.  She is also recently diagnosed with type 2 diabetes and was placed on metformin.  She denies chest pain or shortness of breath.   Current Meds  Medication Sig  . albuterol (PROAIR HFA) 108 (90 Base) MCG/ACT inhaler INHALE 2 PUFFS INTO THE LUNGS EVERY 6 HOURS AS NEEDED FOR WHEEZING OR SHORTNESS OF BREATH  . azelastine (OPTIVAR) 0.05 % ophthalmic solution Place 1 drop into both eyes 2 (two) times daily.  [name] b complex vitamins capsule Take 1 capsule by mouth daily.   . budesonide-formoterol (SYMBICORT) 160-4.5 MCG/ACT inhaler Inhale 2 puffs then rinse mouth, twice daily  . diphenoxylate-atropine (LOMOTIL) 2.5-0.025 MG tablet Take 1 tablet by mouth 4 (four) times daily as needed. For stomach  . DULoxetine (CYMBALTA) 60 MG capsule Take 1 capsule by mouth daily.  . fluticasone (FLONASE) 50 MCG/ACT nasal spray 1-2 puffs each nostril once daily while needed  . fluticasone-salmeterol (ADVAIR HFA) 115-21 MCG/ACT inhaler Inhale 2 puffs into the lungs 2 (two) times daily.  [name] gabapentin (NEURONTIN) 300 MG capsule TAKE 1 CAPSULE BY MOUTH TWICE DAILY. ANNUAL APPOINTMENT DUE IN MAY, MUST SEE PROVIDER FOR FUTURE REFILLS (Patient taking differently: Take 300 mg by mouth at bedtime.)  . guaiFENesin (MUCINEX) 600 MG 12 hr tablet Take 1,200 mg by mouth 2 (two) times daily as needed. For chest tightness  . ipratropium-albuterol (DUONEB) 0.5-2.5 (3) MG/3ML SOLN Take 3 mLs by nebulization every 6 (six) hours as needed.  . irbesartan-hydrochlorothiazide (AVALIDE) 300-12.5 MG tablet TAKE 1 TABLET BY MOUTH DAILY  . meloxicam (MOBIC) 15 MG tablet TAKE 1 TABLET(15 MG) BY MOUTH DAILY  . metFORMIN (GLUCOPHAGE) 500 MG tablet Take 1 tablet (500 mg total) by mouth daily with breakfast.  . metoprolol succinate (TOPROL-XL) 100 MG 24 hr tablet TAKE 1 TABLET(100 MG) BY MOUTH DAILY  . Multiple Vitamin (MULTIVITAMIN) tablet Take 1 tablet by mouth daily.  . Multiple Vitamins-Minerals (PRESERVISION AREDS PO) Take by mouth daily.  . Multiple Vitamins-Minerals (VITAMIN D3 COMPLETE PO) Take 50 mcg by mouth daily.  . Nebulizers (COMPRESSOR NEBULIZER)  MISC As directed  . rosuvastatin (CRESTOR) 20 MG tablet TAKE 1 TABLET(20 MG) BY MOUTH DAILY  . traZODone (DESYREL) 50 MG tablet TAKE 1 TABLET(50 MG) BY MOUTH AT BEDTIME AS NEEDED FOR SLEEP     Allergies  Allergen Reactions  . Sulfonamide Derivatives Other (See Comments)    Childhood allergy     Social History   Socioeconomic History  . Marital  status: Single    Spouse name: Not on file  . Number of children: 1  . Years of education: College  . Highest education level: Not on file  Occupational History  . Not on file  Tobacco Use  . Smoking status: Former Smoker    Packs/day: 2.00    Years: 15.00    Pack years: 30.00    Types: Cigarettes    Quit date: 12/18/1978    Years since quitting: 42.4  . Smokeless tobacco: Never Used  Vaping Use  . Vaping Use: Never used  Substance and Sexual Activity  . Alcohol use: No  . Drug use: No    Comment: hx of marijuana use   . Sexual activity: Not on file  Other Topics Concern  . Not on file  Social History Narrative   Patient is single.   Patient has a college education.   Patient drinks one caffeine drink per day.   Patient is right-handed.   Patient has one child.         Social Determinants of Health   Financial Resource Strain: Low Risk   . Difficulty of Paying Living Expenses: Not very hard  Food Insecurity: Not on file  Transportation Needs: Not on file  Physical Activity: Not on file  Stress: Not on file  Social Connections: Not on file  Intimate Partner Violence: Not on file     Review of Systems: General: negative for chills, fever, night sweats or weight changes.  Cardiovascular: negative for chest pain, dyspnea on exertion, edema, orthopnea, palpitations, paroxysmal nocturnal dyspnea or shortness of breath Dermatological: negative for rash Respiratory: negative for cough or wheezing Urologic: negative for hematuria Abdominal: negative for nausea, vomiting, diarrhea, bright red blood per rectum, melena, or hematemesis Neurologic: negative for visual changes, syncope, or dizziness All other systems reviewed and are otherwise negative except as noted above.    Blood pressure 130/64, pulse 75, height 5\' 1"  (1.549 m), weight 160 lb 3.2 oz (72.7 kg), SpO2 98 %.  General appearance: alert and no distress Neck: no adenopathy, no carotid bruit, no JVD, supple,  symmetrical, trachea midline and thyroid not enlarged, symmetric, no tenderness/mass/nodules Lungs: clear to auscultation bilaterally Heart: regular rate and rhythm, S1, S2 normal, no murmur, click, rub or gallop Extremities: extremities normal, atraumatic, no cyanosis or edema Pulses: 2+ and symmetric Skin: Skin color, texture, turgor normal. No rashes or lesions Neurologic: Alert and oriented X 3, normal strength and tone. Normal symmetric reflexes. Normal coordination and gait  EKG sinus rhythm at 75 without ST or T wave changes.  I personally reviewed this EKG.  ASSESSMENT AND PLAN:   Hyperlipidemia associated with type 2 diabetes mellitus ([place]) History of hyperlipidemia on statin therapy with lipid profile performed 05/02/2021 revealing total cholesterol 154, LDL 72 and HDL 46.  Essential hypertension History of essential hypertension blood pressure measured today 130/64.  She is on Avalide and metoprolol.  OSA on CPAP History of obstructive sleep apnea on CPAP.      Lorretta Harp MD FACP,FACC,FAHA, [facility] 05/11/2021 9:58 AM

## 2021-05-11 NOTE — Telephone Encounter (Signed)
ATC Patient.  LM for Patient to call back.   Dr. Annamaria Boots sent Advair to [facility]. 05/06/21.

## 2021-05-11 NOTE — Assessment & Plan Note (Signed)
History of essential hypertension blood pressure measured today 130/64.  She is on Avalide and metoprolol.

## 2021-05-11 NOTE — Patient Instructions (Signed)

## 2021-05-17 ENCOUNTER — Telehealth: Payer: Self-pay | Admitting: Internal Medicine

## 2021-05-17 DIAGNOSIS — E1169 Type 2 diabetes mellitus with other specified complication: Secondary | ICD-10-CM

## 2021-05-17 NOTE — Telephone Encounter (Signed)
Referral placed.

## 2021-05-17 NOTE — Telephone Encounter (Signed)
   Patient requesting referral to nutritionist. She states she does not plan to take the Metoformin

## 2021-05-17 NOTE — Telephone Encounter (Signed)
See below

## 2021-05-18 ENCOUNTER — Other Ambulatory Visit: Payer: Self-pay

## 2021-05-18 ENCOUNTER — Encounter (INDEPENDENT_AMBULATORY_CARE_PROVIDER_SITE_OTHER): Payer: Medicare Other | Admitting: Ophthalmology

## 2021-05-18 DIAGNOSIS — H35033 Hypertensive retinopathy, bilateral: Secondary | ICD-10-CM | POA: Diagnosis not present

## 2021-05-18 DIAGNOSIS — H353112 Nonexudative age-related macular degeneration, right eye, intermediate dry stage: Secondary | ICD-10-CM

## 2021-05-18 DIAGNOSIS — H353221 Exudative age-related macular degeneration, left eye, with active choroidal neovascularization: Secondary | ICD-10-CM

## 2021-05-18 DIAGNOSIS — I10 Essential (primary) hypertension: Secondary | ICD-10-CM | POA: Diagnosis not present

## 2021-05-18 DIAGNOSIS — H43813 Vitreous degeneration, bilateral: Secondary | ICD-10-CM | POA: Diagnosis not present

## 2021-05-20 ENCOUNTER — Ambulatory Visit
Admission: RE | Admit: 2021-05-20 | Discharge: 2021-05-20 | Disposition: A | Discharge status: Home / Self Care | Payer: Medicare Other | Source: Ambulatory Visit | Attending: Internal Medicine | Admitting: Internal Medicine

## 2021-05-20 ENCOUNTER — Other Ambulatory Visit: Payer: Self-pay

## 2021-05-20 DIAGNOSIS — R9389 Abnormal findings on diagnostic imaging of other specified body structures: Secondary | ICD-10-CM

## 2021-05-23 ENCOUNTER — Other Ambulatory Visit: Payer: Self-pay

## 2021-05-23 DIAGNOSIS — R911 Solitary pulmonary nodule: Secondary | ICD-10-CM

## 2021-05-23 DIAGNOSIS — J4521 Mild intermittent asthma with (acute) exacerbation: Secondary | ICD-10-CM

## 2021-05-25 NOTE — Telephone Encounter (Signed)
Lmtcb for pt.  

## 2021-05-26 NOTE — Telephone Encounter (Signed)
Lmtcb for pt.  

## 2021-05-27 MED ORDER — FLUTICASONE-SALMETEROL 250-50 MCG/ACT IN AEPB: 1.0000 | INHALATION_SPRAY | Freq: Two times a day (BID) | RESPIRATORY_TRACT | 12 refills | Status: DC

## 2021-05-27 MED ORDER — BREO ELLIPTA 100-25 MCG/INH IN AEPB: 1.0000 | INHALATION_SPRAY | Freq: Every day | RESPIRATORY_TRACT | 0 refills | Status: DC

## 2021-05-27 NOTE — Telephone Encounter (Signed)
Called and spoke with patient, advised that Dr. Annamaria Boots stated that we could leave 2 samples of Breo up at the front for her to pick up.  I also let her know that I was sending in a script for Wixela, the generic for Advair.  She verbalized understanding. I verified the pharmacy she wanted it sent to and script has been sent.  She stated she will be back in town either Wednesday or Thursday of next week, [place]  Nothing further needed.

## 2021-05-27 NOTE — Telephone Encounter (Signed)
Called and spoke with patient, she states that all the inhalers that were sent in for her have been denied:  anoro, Breo and Advair.  She asked if there was a generic for any of them.  I advised her that Advair has a generic called Wixela.  I let her know I would send a message to Dr. Annamaria Boots to see if he wants to prescribe Wixela.  She verbalized understanding.    Dr. Annamaria Boots, Jearl Klinefelter, Breo and Advair have been denied by patient's insurance.  She wants to know if we can prescribe a generic inhaler for her?  Do you want [place] to try Wixela and see if insurance will cover it?  In the meantime, do you want [place] to give her some samples of Breo?  Please advise.

## 2021-05-30 ENCOUNTER — Telehealth: Payer: Self-pay | Admitting: Pharmacist

## 2021-06-06 ENCOUNTER — Telehealth: Payer: Self-pay | Admitting: Internal Medicine

## 2021-06-06 NOTE — Telephone Encounter (Signed)
Pt is calling in regards to obtaining an inhaler that is not costly she stated that the Advair inhaler was $394.26/mo and she stated that she spoke w/ Nira Conn and was informed there was a generic make that is much cheaper and she is just wanting to know more information in regards to it. Pls regard; [phone]

## 2021-06-06 NOTE — Telephone Encounter (Signed)
Dr. Annamaria Boots, please advise if you would be okay with [place] prescribing the generic of Advair for pt.  Allergies  Allergen Reactions   Sulfonamide Derivatives Other (See Comments)    Childhood allergy     Current Outpatient Medications:    albuterol (PROAIR HFA) 108 (90 Base) MCG/ACT inhaler, INHALE 2 PUFFS INTO THE LUNGS EVERY 6 HOURS AS NEEDED FOR WHEEZING OR SHORTNESS OF BREATH, Disp: 18 g, Rfl: 12   azelastine (OPTIVAR) 0.05 % ophthalmic solution, Place 1 drop into both eyes 2 (two) times daily., Disp: 6 mL, Rfl: 3   b complex vitamins capsule, Take 1 capsule by mouth daily., Disp: , Rfl:    diphenoxylate-atropine (LOMOTIL) 2.5-0.025 MG tablet, Take 1 tablet by mouth 4 (four) times daily as needed. For stomach, Disp: 30 tablet, Rfl: 5   DULoxetine (CYMBALTA) 60 MG capsule, Take 1 capsule by mouth daily., Disp: , Rfl: 1   fluticasone (FLONASE) 50 MCG/ACT nasal spray, 1-2 puffs each nostril once daily while needed, Disp: 16 g, Rfl: prn   fluticasone-salmeterol (WIXELA INHUB) 250-50 MCG/ACT AEPB, Inhale 1 puff into the lungs in the morning and at bedtime., Disp: 1 each, Rfl: 12   gabapentin (NEURONTIN) 300 MG capsule, TAKE 1 CAPSULE BY MOUTH TWICE DAILY. ANNUAL APPOINTMENT DUE IN MAY, MUST SEE PROVIDER FOR FUTURE REFILLS (Patient taking differently: Take 300 mg by mouth at bedtime.), Disp: 180 capsule, Rfl: 0   guaiFENesin (MUCINEX) 600 MG 12 hr tablet, Take 1,200 mg by mouth 2 (two) times daily as needed. For chest tightness, Disp: , Rfl:    ipratropium-albuterol (DUONEB) 0.5-2.5 (3) MG/3ML SOLN, Take 3 mLs by nebulization every 6 (six) hours as needed., Disp: 75 mL, Rfl: 12   irbesartan-hydrochlorothiazide (AVALIDE) 300-12.5 MG tablet, TAKE 1 TABLET BY MOUTH DAILY, Disp: 90 tablet, Rfl: 3   meloxicam (MOBIC) 15 MG tablet, TAKE 1 TABLET(15 MG) BY MOUTH DAILY, Disp: 90 tablet, Rfl: 0   metoprolol succinate (TOPROL-XL) 100 MG 24 hr tablet, TAKE 1 TABLET(100 MG) BY MOUTH DAILY, Disp: 90 tablet, Rfl:  1   Multiple Vitamin (MULTIVITAMIN) tablet, Take 1 tablet by mouth daily., Disp: , Rfl:    Multiple Vitamins-Minerals (PRESERVISION AREDS PO), Take by mouth daily., Disp: , Rfl:    Multiple Vitamins-Minerals (VITAMIN D3 COMPLETE PO), Take 50 mcg by mouth daily., Disp: , Rfl:    Nebulizers (COMPRESSOR NEBULIZER) MISC, As directed, Disp: 1 each, Rfl: 0   rosuvastatin (CRESTOR) 20 MG tablet, TAKE 1 TABLET(20 MG) BY MOUTH DAILY, Disp: 90 tablet, Rfl: 3   traZODone (DESYREL) 50 MG tablet, TAKE 1 TABLET(50 MG) BY MOUTH AT BEDTIME AS NEEDED FOR SLEEP, Disp: 30 tablet, Rfl: 5

## 2021-06-06 NOTE — Telephone Encounter (Signed)
Yes thanks generic Advair is fine

## 2021-06-07 NOTE — Telephone Encounter (Signed)
Called and spoke with pt letting her know that CY said he was fine with sending Rx for generic Advair to pharmacy for her and she verbalized understanding. Pt said that [place] and wanted to wait until she got back prior to [place] sending Rx and I stated to her to call us once she returned and we could then send Rx for generic Advair for her. Nothing further needed.

## 2021-06-09 NOTE — Progress Notes (Signed)
    Chronic Care Management Pharmacy Assistant   Name: Amber Roman  MRN: [id number] DOB: 02/04/1946  Reason for Encounter: Disease State    Medications: Outpatient Encounter Medications as of 05/30/2021  Medication Sig Note   albuterol (PROAIR HFA) 108 (90 Base) MCG/ACT inhaler INHALE 2 PUFFS INTO THE LUNGS EVERY 6 HOURS AS NEEDED FOR WHEEZING OR SHORTNESS OF BREATH    azelastine (OPTIVAR) 0.05 % ophthalmic solution Place 1 drop into both eyes 2 (two) times daily.    b complex vitamins capsule Take 1 capsule by mouth daily.    diphenoxylate-atropine (LOMOTIL) 2.5-0.025 MG tablet Take 1 tablet by mouth 4 (four) times daily as needed. For stomach    DULoxetine (CYMBALTA) 60 MG capsule Take 1 capsule by mouth daily. 10/25/2015: Received from: External Pharmacy Received Sig:    fluticasone (FLONASE) 50 MCG/ACT nasal spray 1-2 puffs each nostril once daily while needed    fluticasone-salmeterol (WIXELA INHUB) 250-50 MCG/ACT AEPB Inhale 1 puff into the lungs in the morning and at bedtime.    gabapentin (NEURONTIN) 300 MG capsule TAKE 1 CAPSULE BY MOUTH TWICE DAILY. ANNUAL APPOINTMENT DUE IN MAY, MUST SEE PROVIDER FOR FUTURE REFILLS (Patient taking differently: Take 300 mg by mouth at bedtime.)    guaiFENesin (MUCINEX) 600 MG 12 hr tablet Take 1,200 mg by mouth 2 (two) times daily as needed. For chest tightness    ipratropium-albuterol (DUONEB) 0.5-2.5 (3) MG/3ML SOLN Take 3 mLs by nebulization every 6 (six) hours as needed.    irbesartan-hydrochlorothiazide (AVALIDE) 300-12.5 MG tablet TAKE 1 TABLET BY MOUTH DAILY    meloxicam (MOBIC) 15 MG tablet TAKE 1 TABLET(15 MG) BY MOUTH DAILY    metoprolol succinate (TOPROL-XL) 100 MG 24 hr tablet TAKE 1 TABLET(100 MG) BY MOUTH DAILY    Multiple Vitamin (MULTIVITAMIN) tablet Take 1 tablet by mouth daily.    Multiple Vitamins-Minerals (PRESERVISION AREDS PO) Take by mouth daily.    Multiple Vitamins-Minerals (VITAMIN D3 COMPLETE PO) Take 50 mcg by mouth  daily.    Nebulizers (COMPRESSOR NEBULIZER) MISC As directed    rosuvastatin (CRESTOR) 20 MG tablet TAKE 1 TABLET(20 MG) BY MOUTH DAILY    traZODone (DESYREL) 50 MG tablet TAKE 1 TABLET(50 MG) BY MOUTH AT BEDTIME AS NEEDED FOR SLEEP    No facility-administered encounter medications on file as of 05/30/2021.   Pharmacist Review  Have you had any problems recently with your health? Patient states that she does not have any new health issues at this time  Have you had any problems with your pharmacy? Patient states hat she was having an issue with the pharmacy but seems to have worked it out  What issues or side effects are you having with your medications? Patient states that she is not having any side effects from medications  What would you like me to pass along to [facility], CPP for them to help you with?  Patient states that she is doing well and does not have any concerns about health or medications at this time  What can we do to take care of you better?  Patient states if any changes she will contact Dr. Nathanial Millman office   Ethelene Hal Clinical Pharmacist Assistant [phone]   Time spent:25

## 2021-06-13 ENCOUNTER — Telehealth: Payer: Self-pay | Admitting: Internal Medicine

## 2021-06-13 NOTE — Telephone Encounter (Signed)
Spoke with pt and notified her that [facility] order had been placed prior and pt stated understanding. I verified pharmacy as well. Called pharmacy that medication was ready for pick up by pt which pharmacy stated it was. Nothing further needed at this time.

## 2021-06-13 NOTE — Telephone Encounter (Signed)
disregard

## 2021-06-15 ENCOUNTER — Encounter (INDEPENDENT_AMBULATORY_CARE_PROVIDER_SITE_OTHER): Payer: Medicare Other | Admitting: Ophthalmology

## 2021-06-15 ENCOUNTER — Other Ambulatory Visit: Payer: Self-pay

## 2021-06-15 DIAGNOSIS — H43813 Vitreous degeneration, bilateral: Secondary | ICD-10-CM

## 2021-06-15 DIAGNOSIS — H353221 Exudative age-related macular degeneration, left eye, with active choroidal neovascularization: Secondary | ICD-10-CM

## 2021-06-15 DIAGNOSIS — I10 Essential (primary) hypertension: Secondary | ICD-10-CM | POA: Diagnosis not present

## 2021-06-15 DIAGNOSIS — H353112 Nonexudative age-related macular degeneration, right eye, intermediate dry stage: Secondary | ICD-10-CM

## 2021-06-15 DIAGNOSIS — H35033 Hypertensive retinopathy, bilateral: Secondary | ICD-10-CM | POA: Diagnosis not present

## 2021-06-16 NOTE — Progress Notes (Signed)
    Chronic Care Management Pharmacy Assistant   Name: Amber Roman  MRN: [id number] DOB: 08/15/1946   Reason for Encounter: Chart Review    Medications: Outpatient Encounter Medications as of 05/30/2021  Medication Sig Note   albuterol (PROAIR HFA) 108 (90 Base) MCG/ACT inhaler INHALE 2 PUFFS INTO THE LUNGS EVERY 6 HOURS AS NEEDED FOR WHEEZING OR SHORTNESS OF BREATH    azelastine (OPTIVAR) 0.05 % ophthalmic solution Place 1 drop into both eyes 2 (two) times daily.    b complex vitamins capsule Take 1 capsule by mouth daily.    diphenoxylate-atropine (LOMOTIL) 2.5-0.025 MG tablet Take 1 tablet by mouth 4 (four) times daily as needed. For stomach    DULoxetine (CYMBALTA) 60 MG capsule Take 1 capsule by mouth daily. 10/25/2015: Received from: External Pharmacy Received Sig:    fluticasone (FLONASE) 50 MCG/ACT nasal spray 1-2 puffs each nostril once daily while needed    fluticasone-salmeterol (WIXELA INHUB) 250-50 MCG/ACT AEPB Inhale 1 puff into the lungs in the morning and at bedtime.    gabapentin (NEURONTIN) 300 MG capsule TAKE 1 CAPSULE BY MOUTH TWICE DAILY. ANNUAL APPOINTMENT DUE IN MAY, MUST SEE PROVIDER FOR FUTURE REFILLS (Patient taking differently: Take 300 mg by mouth at bedtime.)    guaiFENesin (MUCINEX) 600 MG 12 hr tablet Take 1,200 mg by mouth 2 (two) times daily as needed. For chest tightness    ipratropium-albuterol (DUONEB) 0.5-2.5 (3) MG/3ML SOLN Take 3 mLs by nebulization every 6 (six) hours as needed.    irbesartan-hydrochlorothiazide (AVALIDE) 300-12.5 MG tablet TAKE 1 TABLET BY MOUTH DAILY    meloxicam (MOBIC) 15 MG tablet TAKE 1 TABLET(15 MG) BY MOUTH DAILY    metoprolol succinate (TOPROL-XL) 100 MG 24 hr tablet TAKE 1 TABLET(100 MG) BY MOUTH DAILY    Multiple Vitamin (MULTIVITAMIN) tablet Take 1 tablet by mouth daily.    Multiple Vitamins-Minerals (PRESERVISION AREDS PO) Take by mouth daily.    Multiple Vitamins-Minerals (VITAMIN D3 COMPLETE PO) Take 50 mcg by mouth  daily.    Nebulizers (COMPRESSOR NEBULIZER) MISC As directed    rosuvastatin (CRESTOR) 20 MG tablet TAKE 1 TABLET(20 MG) BY MOUTH DAILY    traZODone (DESYREL) 50 MG tablet TAKE 1 TABLET(50 MG) BY MOUTH AT BEDTIME AS NEEDED FOR SLEEP    No facility-administered encounter medications on file as of 05/30/2021.   Pharmacist Review Reviewed chart for medication changes and adherence.  No OVs, Consults, or hospital visits since last care coordination call / Pharmacist visit. No medication changes indicated  No gaps in adherence identified. Patient has follow up scheduled with pharmacy team. No further action required.   [place] Pharmacist Assistant [phone]   Time spent:8

## 2021-06-17 ENCOUNTER — Other Ambulatory Visit: Payer: Self-pay | Admitting: Internal Medicine

## 2021-06-30 ENCOUNTER — Other Ambulatory Visit: Payer: Self-pay | Admitting: Internal Medicine

## 2021-07-04 ENCOUNTER — Encounter: Payer: Self-pay | Admitting: Registered"

## 2021-07-04 ENCOUNTER — Other Ambulatory Visit: Payer: Self-pay

## 2021-07-04 ENCOUNTER — Encounter: Payer: Medicare Other | Attending: Internal Medicine | Admitting: Registered"

## 2021-07-04 DIAGNOSIS — E1165 Type 2 diabetes mellitus with hyperglycemia: Secondary | ICD-10-CM | POA: Diagnosis not present

## 2021-07-04 NOTE — Progress Notes (Signed)
Diabetes Self-Management Education  Visit Type: First/Initial  Appt. Start Time: 1400 Appt. End Time: 1510  07/04/2021  [name], identified by name and date of birth, is a 75 y.o. female with a diagnosis of Diabetes: Type 2.   ASSESSMENT  There were no vitals taken for this visit. There is no height or weight on file to calculate BMI.   6.8% - 7.9% 05/02/21 DM Medication: none (didn't want to start metformin) PMH: Neuropathy in feet,Osa on CPAP, HTN  Patient states one of her main concerns is her decreased energy and wants to make changes that will help her have sustained energy.   Dietary: Pt reports she quit all soda and it makes her stomach hurt now and rarely adds salt to anything., Pt states when she plays bridge or watching TV sometimes snacks on things like popcorn or ice cream  Sleep: takes awhile but after gets to sleep does not wake up during the night using her CPAP  Pt states she lives in [place] in the winter (6 months). Pt states she has not established care for her eyes in [place] and needs to get shots in her L eye every 6 months.   Exercise: pt states it is hard to exercise due to asthma, walks dog daily but slowly. Pt states she walks with her friend on trails around [place] in [place].  Patient is interested in getting a CGM. Until patient contacts insurance company can use meter provided today: [employer] Me Lot [id number] Exp: 04/22/2022 CBG: 134 mg/dL ~ 2 hrs after lunch   Patient states she would like to come in for follow-up in about a year before her referral expires.   Diabetes Self-Management Education - 07/04/21 1401       Visit Information   Visit Type First/Initial      Initial Visit   Diabetes Type Type 2    Are you currently following a meal plan? No    Are you taking your medications as prescribed? Not on Medications      Health Coping   How would you rate your overall health? Fair      Psychosocial Assessment   Patient  Belief/Attitude about Diabetes --   motivated and afraid   How often do you need to have someone help you when you read instructions, pamphlets, or other written materials from your doctor or pharmacy? 1 - Never    What is the last grade level you completed in school? college      Complications   Last HgB A1C per patient/outside source 7.9 %    How often do you check your blood sugar? 0 times/day (not testing)    Have you had a dilated eye exam in the past 12 months? Yes    Have you had a dental exam in the past 12 months? Yes    Are you checking your feet? Yes    How many days per week are you checking your feet? 7      Dietary Intake   Breakfast Daves 21 grains toast with peanut butter, 8 oz coffee 1 tsp sugar in the raw    Snack (morning) none or banana    Lunch (cafeteria) chicken livers, strawberries and banana, yeast roll, candied yams, 1/2 piece of coconut cream pie    Snack (afternoon) fruit    Dinner canteloupe, 1 shrimp rolls, ice cream, 6 corn chips    Snack (evening) skinny pop, ice cream    Beverage(s) water  Exercise   Exercise Type Light (walking / raking leaves)    How many days per week to you exercise? 7    How many minutes per day do you exercise? 30    Total minutes per week of exercise 210      Patient Education   Previous Diabetes Education No    Disease state  Definition of diabetes, type 1 and 2, and the diagnosis of diabetes    Nutrition management  Role of diet in the treatment of diabetes and the relationship between the three main macronutrients and blood glucose level;Carbohydrate counting   very basic carb counting   Medications Reviewed patients medication for diabetes, action, purpose, timing of dose and side effects.    Monitoring Identified appropriate SMBG and/or A1C goals.;Taught/evaluated SMBG meter.    Acute complications Taught treatment of hypoglycemia - the 15 rule.      Individualized Goals (developed by patient)   Nutrition General  guidelines for healthy choices and portions discussed    Physical Activity Exercise 3-5 times per week    Medications take my medication as prescribed    Monitoring  test my blood glucose as discussed      Outcomes   Expected Outcomes Demonstrated interest in learning. Expect positive outcomes    Future DMSE Yearly    Program Status Completed             Individualized Plan for Diabetes Self-Management Training:   Learning Objective:  Patient will have a greater understanding of diabetes self-management. Patient education plan is to attend individual and/or group sessions per assessed needs and concerns.   Patient Instructions  To increase energy: Eat less dessert by taking to the trash if someone brings it to you. Keep sweets out of your house.  [place] in [place] find a pool.  Check blood sugar: call insurance to find out about continuous glucose monitor coverage. Until then use your Accu chek to check blood sugar occasionally   Expected Outcomes:  Demonstrated interest in learning. Expect positive outcomes  Education material provided: ADA - How to Thrive: A Guide for Your Journey with Diabetes, Planning Healthy Meals  If problems or questions, patient to contact team via:  Phone and MyChart  Future DSME appointment: Yearly

## 2021-07-04 NOTE — Patient Instructions (Signed)
To increase energy: Eat less dessert by taking to the trash if someone brings it to you. Keep sweets out of your house.  Exercise: [place] get more exercise, in [place] find a pool.  Check blood sugar: call insurance to find out about continuous glucose monitor coverage. Until then use your Accu chek to check blood sugar occasionally

## 2021-07-13 ENCOUNTER — Encounter (INDEPENDENT_AMBULATORY_CARE_PROVIDER_SITE_OTHER): Payer: Medicare Other | Admitting: Ophthalmology

## 2021-07-13 ENCOUNTER — Other Ambulatory Visit: Payer: Self-pay

## 2021-07-13 DIAGNOSIS — I10 Essential (primary) hypertension: Secondary | ICD-10-CM | POA: Diagnosis not present

## 2021-07-13 DIAGNOSIS — H353221 Exudative age-related macular degeneration, left eye, with active choroidal neovascularization: Secondary | ICD-10-CM | POA: Diagnosis not present

## 2021-07-13 DIAGNOSIS — H35033 Hypertensive retinopathy, bilateral: Secondary | ICD-10-CM

## 2021-07-13 DIAGNOSIS — H353112 Nonexudative age-related macular degeneration, right eye, intermediate dry stage: Secondary | ICD-10-CM | POA: Diagnosis not present

## 2021-07-13 DIAGNOSIS — H43813 Vitreous degeneration, bilateral: Secondary | ICD-10-CM

## 2021-07-14 ENCOUNTER — Telehealth: Payer: Self-pay | Admitting: Pharmacist

## 2021-07-14 NOTE — Progress Notes (Signed)
    Chronic Care Management Pharmacy Assistant  error 

## 2021-07-30 ENCOUNTER — Other Ambulatory Visit: Payer: Self-pay | Admitting: Internal Medicine

## 2021-08-09 ENCOUNTER — Other Ambulatory Visit: Payer: Self-pay

## 2021-08-09 ENCOUNTER — Ambulatory Visit (INDEPENDENT_AMBULATORY_CARE_PROVIDER_SITE_OTHER): Payer: Medicare Other | Admitting: Pharmacist

## 2021-08-09 DIAGNOSIS — E785 Hyperlipidemia, unspecified: Secondary | ICD-10-CM

## 2021-08-09 DIAGNOSIS — I700 Atherosclerosis of aorta: Secondary | ICD-10-CM

## 2021-08-09 DIAGNOSIS — E1169 Type 2 diabetes mellitus with other specified complication: Secondary | ICD-10-CM | POA: Diagnosis not present

## 2021-08-09 DIAGNOSIS — J4520 Mild intermittent asthma, uncomplicated: Secondary | ICD-10-CM | POA: Diagnosis not present

## 2021-08-09 DIAGNOSIS — I10 Essential (primary) hypertension: Secondary | ICD-10-CM | POA: Diagnosis not present

## 2021-08-09 MED ORDER — BUDESONIDE-FORMOTEROL FUMARATE 160-4.5 MCG/ACT IN AERO: 2.0000 | INHALATION_SPRAY | Freq: Two times a day (BID) | RESPIRATORY_TRACT | 3 refills | Status: DC

## 2021-08-09 NOTE — Patient Instructions (Signed)
Visit Information  Phone number for Pharmacist: [phone]   Goals Addressed             This Visit's Progress    Manage My Medicine       Timeframe:  Long-Range Goal Priority:  Medium Start Date:     02/09/21                        Expected End Date:    02/09/22                   Follow Up Date Feb 2023   - call for medicine refill 2 or 3 days before it runs out - call if I am sick and can't take my medicine - keep a list of all the medicines I take; vitamins and herbals too - use a pillbox to sort medicine    Why is this important?   These steps will help you keep on track with your medicines.   Notes:      Track and Manage My Symptoms-Asthma       Timeframe:  Long-Range Goal Priority:  High Start Date:    02/09/21                         Expected End Date:    08/17/21                   Follow Up Date Feb 2023   - avoid symptom triggers outdoors - begin a symptom diary - eliminate symptom triggers at home - keep follow-up appointments - keep rescue medicines on hand  - Sign Symbicort patient assistance paperwork   Why is this important?   Keeping track of asthma symptoms can tell you a lot about your asthma control.  Based on symptoms and peak flow results you can see how well you are doing.  Your asthma action plan has a green, yellow and red zone. Green means all is good; it is your goal. Yellow means your symptoms are a little worse. You will need to adjust your medications. Being in the red zone means that your   asthma is out of control. You will need to use your rescue medicines. You may need emergency care.          Care Plan : [place]  Updates made by Charlton Haws, RPH since 08/09/2021 12:00 AM     Problem: Hypertension, Hyperlipidemia, Diabetes, GERD, Asthma, Depression, Osteoarthritis and Neuropathy   Priority: High     Long-Range Goal: Disease management   Start Date: 02/09/2021  Expected End Date: 02/09/2022  This  Visit's Progress: On track  Recent Progress: On track  Priority: High  Note:   Current Barriers:  Unable to independently monitor therapeutic efficacy  Pharmacist Clinical Goal(s):  Patient will achieve adherence to monitoring guidelines and medication adherence to achieve therapeutic efficacy through collaboration with PharmD and provider.   Interventions: 1:1 collaboration with Hoyt Koch, MD regarding development and update of comprehensive plan of care as evidenced by provider attestation and co-signature Inter-disciplinary care team collaboration (see longitudinal plan of care) Comprehensive medication review performed; medication list updated in electronic medical record  Hypertension (BP goal <130/80) -Controlled - BP in office has been at goal; pt endorses compliance with medications and denies side effects -Current treatment: Irbesartan-HCTZ 300-12.5 mg daily Metoprolol succinate 100 mg daily -Current home readings: does not check -Educated  on BP goals and benefits of medications for prevention of heart attack, stroke and kidney damage -Recommended to continue current medication  Hyperlipidemia: (LDL goal < 100) -Controlled - LDL is at goal, though higher this year than last year; pt endorses compliance with statin and denies side effects -Current treatment: Rosuvastatin 20 mg daily -Educated on Cholesterol goals; Benefits of statin for ASCVD risk reduction; -Recommended to continue current medication  Asthma (Goal: control symptoms and prevent exacerbations) -Not ideally controlled - pt is unable to afford any maintenance inhalers; her insurance has a high deductible so even covered options are ~$400 on first fill;  -Pulmonary function testing (07/16/20): FEV1 103% predicted, +9% change -Exacerbations requiring treatment in last 6 months: none -Current treatment  Wixela 2 puff BID - not covered Albuterol HFA prn Duoneb q6h PRN -Medications previously tried:  Cherylynn Ridges, Memory Dance (cost) -Patient denies consistent use of maintenance inhaler -Frequency of rescue inhaler use: less than once a week -Assessed patient finances - pt will qualify for pt assistance for Symbicort. Pulmonologist prescribed Symbicort previously but d/c'd due to high cost;  -Plan: Pursue pt assistance for Symbicort. Pt will come to office to sign paperwork  Diabetes (A1c goal <7%) -Controlled - pt was prescribed Metformin in May but did not start taking it until last week; she has met with nutritionist and is working on diet changes; she would like to have another A1c done before she leaves for [place] in October -Current medications: Metformin 500 mg daily -Counseled on A1c goal;  benefits of metformin -Scheduled visit for repeat A1c 9/2 at patient request -Recommend to continue current medication  Patient Goals/Self-Care Activities Patient will:  - take medications as prescribed -focus on medication adherence by pill box -target a minimum of 150 minutes of moderate intensity exercise weekly -engage in dietary modifications by reducing carbs in diet -Sign Symbicort assistance paperwork at PCP office      Patient verbalizes understanding of instructions provided today and agrees to view in [place].  Telephone follow up appointment with pharmacy team member scheduled for: 6 months  Charlene Brooke, PharmD, [place], CPP Clinical Pharmacist [facility] [phone]

## 2021-08-09 NOTE — Progress Notes (Signed)
Chronic Care Management Pharmacy Note  08/09/2021 Name:  Amber Roman MRN:  [id number] DOB:  May 15, 1946  Summary: -Pt cannot afford inhaler due to high deductible insurance plan - all inhalers, even covered inhalers, will be ~$400 on first fill. Based on reported income she will qualify for Symbicort patient assistance -Pt started metformin last week and requests repeat A1c before she leaves for [place] in October  Recommendations/Changes made from today's visit: -Pursue pt assistance for Symbicort - pt will come to office to sign paperwork -Scheduled PCP visit for repeat A1c   Subjective: Amber Roman is an 75 y.o. year old female who is a primary patient of Hoyt Koch, MD.  The CCM team was consulted for assistance with disease management and care coordination needs.    Engaged with patient by telephone for follow up visit in response to provider referral for pharmacy case management and/or care coordination services.   Consent to Services:  The patient was given information about Chronic Care Management services, agreed to services, and gave verbal consent prior to initiation of services.  Please see initial visit note for detailed documentation.   Patient Care Team: Hoyt Koch, MD as PCP - General (Internal Medicine) Lorretta Harp, MD as PCP - Cardiology (Cardiology) Charlton Haws, [facility] as Pharmacist (Pharmacist) Warren Danes, PA-C as Physician Assistant (Dermatology)  Patient is current living in [place], [place] - she lives there every winter and returns to [facility] in the summer.   Pt reports balance is getting progressively worse - has had 2 falls (1 in Nov and 1 this week). She fell on her side and did not hit her head. She denies dizziness/lightheadedness. She did not lose consciousness.   Recent office visits: 05/02/21 Dr Sharlet Salina OV: annual visit - A1c up to 7.9%. Started meformin. Referred to nutrition.  04/28/20 Dr Sharlet Salina OV: f/u BP,  cholesterol, insomnia. A1c up, no meds indicated yet. Rx'd trazodone PRN and pt has stopped lorazepam on her own.  Recent consult visits: 07/04/21 RD Levie Heritage (Dietician): DM counseling.  05/11/21 Dr Gwenlyn Found (cardiology): f/u HLD, HTN. No changes.  05/10/21 PA Sheffield (dermatology): f/u actinic keratosis  05/09/21 NP Clabe Seal (neurology): f/u OSA  05/05/21 Dr Annamaria Boots (pulmonary): f/u asthma. D/C [facility], start Symbicort for cost. Insurance prefers Advair, switched to [employer].  09/21/20 Dr Ellie Lunch (ophthalmology) f/u macular degeneration 07/16/20 PFTs performed. Indicate mild obstructive disease, basically normal. 07/09/20 Dr Mardelle Matte (ortho): f/u low back pain  Hospital visits: None in previous 6 months  Objective:  Lab Results  Component Value Date   CREATININE 0.87 05/02/2021   BUN 19 05/02/2021   GFR 64.59 05/02/2021   GFRNONAA >90 01/11/2013   GFRAA >90 01/11/2013   NA 142 05/02/2021   K 5.1 05/02/2021   CALCIUM 9.9 05/02/2021   CO2 32 05/02/2021    Lab Results  Component Value Date/Time   HGBA1C 7.9 (H) 05/02/2021 01:24 PM   HGBA1C 6.8 (H) 04/28/2020 10:35 AM   HGBA1C 6.6 09/19/2017 12:00 AM   GFR 64.59 05/02/2021 01:24 PM   GFR 63.36 04/28/2020 10:35 AM   MICROALBUR 2.0 (H) 05/02/2021 01:24 PM   MICROALBUR 6.0 09/19/2017 12:00 AM    Last diabetic Eye exam: No results found for: HMDIABEYEEXA  Last diabetic Foot exam: No results found for: HMDIABFOOTEX   Lab Results  Component Value Date   CHOL 154 05/02/2021   HDL 46.60 05/02/2021   LDLCALC 72 04/28/2020   LDLDIRECT 84.0 05/02/2021  TRIG 300.0 (H) 05/02/2021   CHOLHDL 3 05/02/2021    Hepatic Function Latest Ref Rng & Units 05/02/2021 04/28/2020 07/22/2019  Total Protein 6.0 - 8.3 g/dL 7.1 7.0 7.1  Albumin 3.5 - 5.2 g/dL 4.4 4.4 5.0(H)  AST 0 - 37 U/L '20 20 24  ' ALT 0 - 35 U/L 32 26 34(H)  Alk Phosphatase 39 - 117 U/L 84 83 85  Total Bilirubin 0.2 - 1.2 mg/dL 0.5 0.7 0.3  Bilirubin, Direct 0.00 - 0.40 mg/dL - -  0.10    Lab Results  Component Value Date/Time   TSH 3.59 09/19/2017 12:00 AM    CBC Latest Ref Rng & Units 05/02/2021 04/28/2020 09/19/2017  WBC 4.0 - 10.5 K/uL 8.9 6.9 10.7  Hemoglobin 12.0 - 15.0 g/dL 13.1 12.6 13.1  Hematocrit 36.0 - 46.0 % 39.3 38.4 40  Platelets 150.0 - 400.0 K/uL 256.0 234.0 300    Lab Results  Component Value Date/Time   VD25OH 39.2 09/19/2017 12:00 AM    Clinical ASCVD: No  The 10-year ASCVD risk score Mikey Bussing DC Jr., et al., 2013) is: 40.3%   Values used to calculate the score:     Age: 75 years     Sex: Female     Is Non-Hispanic African American: No     Diabetic: Yes     Tobacco smoker: No     Systolic Blood Pressure: 361 mmHg     Is BP treated: Yes     HDL Cholesterol: 46.6 mg/dL     Total Cholesterol: 154 mg/dL    Depression screen [facility] 2/9 07/04/2021 05/02/2021 05/02/2021  Decreased Interest 0 0 0  Down, Depressed, Hopeless 0 0 0  PHQ - 2 Score 0 0 0     Social History   Tobacco Use  Smoking Status Former   Packs/day: 2.00   Years: 15.00   Pack years: 30.00   Types: Cigarettes   Quit date: 12/18/1978   Years since quitting: 42.6  Smokeless Tobacco Never   BP Readings from Last 3 Encounters:  05/11/21 130/64  05/09/21 (!) 147/75  05/05/21 (!) 154/80   Pulse Readings from Last 3 Encounters:  05/11/21 75  05/09/21 74  05/05/21 70   Wt Readings from Last 3 Encounters:  05/11/21 160 lb 3.2 oz (72.7 kg)  05/09/21 160 lb (72.6 kg)  05/05/21 160 lb (72.6 kg)   BMI Readings from Last 3 Encounters:  05/11/21 30.27 kg/m  05/09/21 30.23 kg/m  05/05/21 30.23 kg/m     Assessment/Interventions: Review of patient past medical history, allergies, medications, health status, including review of consultants reports, laboratory and other test data, was performed as part of comprehensive evaluation and provision of chronic care management services.   SDOH:  (Social Determinants of Health) assessments and interventions performed:  Yes  SDOH Screenings   Alcohol Screen: Not on file  Depression (PHQ2-9): Low Risk    PHQ-2 Score: 0  Financial Resource Strain: Low Risk    Difficulty of Paying Living Expenses: Not very hard  Food Insecurity: No Food Insecurity   Worried About Charity fundraiser in the Last Year: Never true   Ran Out of Food in the Last Year: Never true  Housing: Not on file  Physical Activity: Not on file  Social Connections: Not on file  Stress: Not on file  Tobacco Use: Medium Risk   Smoking Tobacco Use: Former   Smokeless Tobacco Use: Never  Transportation Needs: Not on file  CCM Care Plan  Allergies  Allergen Reactions   Sulfonamide Derivatives Other (See Comments)    Childhood allergy     Medications Reviewed Today     Reviewed by Charlton Haws, [facility] (Pharmacist) on 08/09/21 at 1344  Med List Status: <None>   Medication Order Taking? Sig Documenting Provider Last Dose Status Informant  albuterol (PROAIR HFA) 108 (90 Base) MCG/ACT inhaler [id number] Yes INHALE 2 PUFFS INTO THE LUNGS EVERY 6 HOURS AS NEEDED FOR WHEEZING OR SHORTNESS OF BREATH Young, Clinton D, MD Taking Active   azelastine (OPTIVAR) 0.05 % ophthalmic solution [id number] Yes Place 1 drop into both eyes 2 (two) times daily. Baird Lyons D, MD Taking Active   b complex vitamins capsule [id number] Yes Take 1 capsule by mouth daily. [provider] Taking Active   budesonide-formoterol (SYMBICORT) 160-4.5 MCG/ACT inhaler [id number]  Inhale 2 puffs into the lungs 2 (two) times daily. Via AZ&ME pt assistance Hoyt Koch, MD  Active   diphenoxylate-atropine (LOMOTIL) 2.5-0.025 MG tablet [id number] Yes Take 1 tablet by mouth 4 (four) times daily as needed. For stomach Baird Lyons D, MD Taking Active   DULoxetine (CYMBALTA) 60 MG capsule [id number] Yes Take 1 capsule by mouth daily. [provider] Taking Active            Med Note Lockie Pares, Kerman Passey Oct 25, 2015  2:31 PM) Received  from: External Pharmacy Received Sig:   fluticasone (FLONASE) 50 MCG/ACT nasal spray [id number] Yes 1-2 puffs each nostril once daily while needed Baird Lyons D, MD Taking Active   gabapentin (NEURONTIN) 300 MG capsule [id number] Yes TAKE 1 CAPSULE BY MOUTH TWICE DAILY. ANNUAL APPOINTMENT DUE IN MAY, MUST SEE PROVIDER FOR FUTURE REFILLS  Patient taking differently: Take 300 mg by mouth at bedtime.   Hoyt Koch, MD Taking Active   guaiFENesin (MUCINEX) 600 MG 12 hr tablet [id number] Yes Take 1,200 mg by mouth 2 (two) times daily as needed. For chest tightness [provider] Taking Active Self  ipratropium-albuterol (DUONEB) 0.5-2.5 (3) MG/3ML SOLN [id number] Yes Take 3 mLs by nebulization every 6 (six) hours as needed. Baird Lyons D, MD Taking Active   irbesartan-hydrochlorothiazide (AVALIDE) 300-12.5 MG tablet [id number] Yes TAKE 1 TABLET BY MOUTH DAILY Hoyt Koch, MD Taking Active   meloxicam (MOBIC) 15 MG tablet [id number] Yes TAKE 1 TABLET(15 MG) BY MOUTH DAILY Hoyt Koch, MD Taking Active   metoprolol succinate (TOPROL-XL) 100 MG 24 hr tablet [id number] Yes TAKE 1 TABLET(100 MG) BY MOUTH DAILY Hoyt Koch, MD Taking Active   Multiple Vitamin (MULTIVITAMIN) tablet [id number] Yes Take 1 tablet by mouth daily. [provider] Taking Active Self  Multiple Vitamins-Minerals (PRESERVISION AREDS PO) [id number] Yes Take by mouth daily. [provider] Taking Active   Multiple Vitamins-Minerals (VITAMIN D3 COMPLETE PO) [id number] Yes Take 50 mcg by mouth daily. [provider] Taking Active Self  Nebulizers (COMPRESSOR NEBULIZER) MISC [id number] Yes As directed Deneise Lever, MD Taking Active   rosuvastatin (CRESTOR) 20 MG tablet [id number] Yes TAKE 1 TABLET(20 MG) BY MOUTH DAILY Hoyt Koch, MD Taking Active   traZODone (DESYREL) 50 MG tablet [id number] Yes TAKE 1 TABLET(50 MG) BY MOUTH AT BEDTIME AS NEEDED FOR SLEEP  Hoyt Koch, MD Taking Active             Patient Active Problem List   Diagnosis Date Noted   Asthma, moderate persistent 05/05/2021   Aortic atherosclerosis ([place]) 05/04/2021  Diabetes mellitus ([place]) 05/04/2021   Routine general medical examination at a health care facility 04/24/2019   Stopped smoking with greater than 30 pack year history 08/08/2017   Ataxia 08/19/2014   OSA on CPAP 07/03/2014   Insomnia 04/13/2014   Degenerative arthritis of hip 01/10/2013   Hyperlipidemia associated with type 2 diabetes mellitus ([place]) 05/20/2009   Essential hypertension 05/20/2009   Asthmatic bronchitis with acute exacerbation 11/19/2007    Immunization History  Administered Date(s) Administered   DT (Pediatric) 06/16/2013   Fluad Quad(high Dose 65+) 09/19/2019   Influenza Split 08/19/2011, 09/17/2013, 11/16/2014, 09/15/2015, 09/14/2016   Influenza Whole 09/26/2006, 09/17/2009, 10/06/2010, 09/16/2012   Influenza, High Dose Seasonal PF 10/15/2018   Influenza-Unspecified 09/26/2017, 08/30/2018   Moderna Sars-Covid-2 Vaccination 01/23/2020, 02/19/2020, 10/29/2020, 05/04/2021   Pneumococcal Conjugate-13 10/22/2018   Pneumococcal Polysaccharide-23 10/29/2003, 09/17/2009   Pneumococcal-Unspecified 05/22/2014, 07/06/2014   Td 07/20/2003   Tdap 06/19/2013   Zoster Recombinat (Shingrix) 09/17/2020, 11/19/2020   Zoster, Live 05/30/2011    Conditions to be addressed/monitored:  Hypertension, Hyperlipidemia, Diabetes, Asthma, Depression, Osteoarthritis and Neuropathy  Care Plan : [place]  Updates made by Charlton Haws, [place] since 08/09/2021 12:00 AM     Problem: Hypertension, Hyperlipidemia, Diabetes, GERD, Asthma, Depression, Osteoarthritis and Neuropathy   Priority: High     Long-Range Goal: Disease management   Start Date: 02/09/2021  Expected End Date: 02/09/2022  This Visit's Progress: On track  Recent Progress: On track  Priority: High  Note:    Current Barriers:  Unable to independently monitor therapeutic efficacy  Pharmacist Clinical Goal(s):  Patient will achieve adherence to monitoring guidelines and medication adherence to achieve therapeutic efficacy through collaboration with PharmD and provider.   Interventions: 1:1 collaboration with Hoyt Koch, MD regarding development and update of comprehensive plan of care as evidenced by provider attestation and co-signature Inter-disciplinary care team collaboration (see longitudinal plan of care) Comprehensive medication review performed; medication list updated in electronic medical record  Hypertension (BP goal <130/80) -Controlled - BP in office has been at goal; pt endorses compliance with medications and denies side effects -Current treatment: Irbesartan-HCTZ 300-12.5 mg daily Metoprolol succinate 100 mg daily -Current home readings: does not check -Educated on BP goals and benefits of medications for prevention of heart attack, stroke and kidney damage -Recommended to continue current medication  Hyperlipidemia: (LDL goal < 100) -Controlled - LDL is at goal, though higher this year than last year; pt endorses compliance with statin and denies side effects -Current treatment: Rosuvastatin 20 mg daily -Educated on Cholesterol goals; Benefits of statin for ASCVD risk reduction; -Recommended to continue current medication  Asthma (Goal: control symptoms and prevent exacerbations) -Not ideally controlled - pt is unable to afford any maintenance inhalers; her insurance has a high deductible so even covered options are ~$400 on first fill;  -Pulmonary function testing (07/16/20): FEV1 103% predicted, +9% change -Exacerbations requiring treatment in last 6 months: none -Current treatment  Wixela 2 puff BID - not covered Albuterol HFA prn Duoneb q6h PRN -Medications previously tried: Cherylynn Ridges, [facility] (cost) -Patient denies consistent use of maintenance  inhaler -Frequency of rescue inhaler use: less than once a week -Assessed patient finances - pt will qualify for pt assistance for Symbicort. Pulmonologist prescribed Symbicort previously but d/c'd due to high cost;  -Plan: Pursue pt assistance for Symbicort. Pt will come to office to sign paperwork  Diabetes (A1c goal <7%) -Controlled - pt was prescribed Metformin in May but did not start  taking it until last week; she has met with nutritionist and is working on diet changes; she would like to have another A1c done before she leaves for [place] in October -Current medications: Metformin 500 mg daily -Counseled on A1c goal;  benefits of metformin -Scheduled visit for repeat A1c 9/2 at patient request -Recommend to continue current medication  Patient Goals/Self-Care Activities Patient will:  - take medications as prescribed -focus on medication adherence by pill box -target a minimum of 150 minutes of moderate intensity exercise weekly -engage in dietary modifications by reducing carbs in diet -Sign Symbicort assistance paperwork at PCP office       Medication Assistance: Symbicort (AZ&Me) - expected approval/start date 09/10/21 Pt given phone number for [facility] [phone]) for help selecting the optimal Medicare plan for her medications  Compliance/Adherence/Medication fill history: Care Gaps: Hep C screening Eye exam  Star-Rating Drugs: Irbesartan/HCTZ - LF 07/26/21 x 90 ds Rosuvastatin -LF 05/02/21 x 90 ds Metformin - LF 05/11/21 x 90 ds  Patient's preferred pharmacy is:  [id number] [address], [address] [place] [address] Phone: [phone] Fax: [facility] [place], [place] [address] [place] [address] Phone: [phone] Fax: [phone]  [id number] - [place], [place]  [address] [place] [address] Phone: [phone] Fax: [phone]  Uses pill box? Yes Pt endorses 100% compliance  We discussed: Current pharmacy is preferred with insurance plan and patient is satisfied with pharmacy services Patient decided to: Continue current medication management strategy  Care Plan and Follow Up Patient Decision:  Patient agrees to Care Plan and Follow-up.  Plan: Telephone follow up appointment with care management team member scheduled for:  6 months  Charlene Brooke, PharmD, [facility] Clinical Pharmacist [facility] [phone]

## 2021-08-17 ENCOUNTER — Encounter (INDEPENDENT_AMBULATORY_CARE_PROVIDER_SITE_OTHER): Payer: Medicare Other | Admitting: Ophthalmology

## 2021-08-17 ENCOUNTER — Other Ambulatory Visit: Payer: Self-pay

## 2021-08-17 DIAGNOSIS — Z1231 Encounter for screening mammogram for malignant neoplasm of breast: Secondary | ICD-10-CM | POA: Diagnosis not present

## 2021-08-17 DIAGNOSIS — I10 Essential (primary) hypertension: Secondary | ICD-10-CM | POA: Diagnosis not present

## 2021-08-17 DIAGNOSIS — H35033 Hypertensive retinopathy, bilateral: Secondary | ICD-10-CM | POA: Diagnosis not present

## 2021-08-17 DIAGNOSIS — H43813 Vitreous degeneration, bilateral: Secondary | ICD-10-CM

## 2021-08-17 DIAGNOSIS — H353231 Exudative age-related macular degeneration, bilateral, with active choroidal neovascularization: Secondary | ICD-10-CM | POA: Diagnosis not present

## 2021-08-18 ENCOUNTER — Encounter: Payer: Self-pay | Admitting: Podiatry

## 2021-08-18 ENCOUNTER — Ambulatory Visit (INDEPENDENT_AMBULATORY_CARE_PROVIDER_SITE_OTHER): Payer: Medicare Other | Admitting: Podiatry

## 2021-08-18 DIAGNOSIS — M79675 Pain in left toe(s): Secondary | ICD-10-CM | POA: Diagnosis not present

## 2021-08-18 DIAGNOSIS — B351 Tinea unguium: Secondary | ICD-10-CM

## 2021-08-18 DIAGNOSIS — M79674 Pain in right toe(s): Secondary | ICD-10-CM

## 2021-08-18 DIAGNOSIS — E1169 Type 2 diabetes mellitus with other specified complication: Secondary | ICD-10-CM

## 2021-08-19 ENCOUNTER — Ambulatory Visit (INDEPENDENT_AMBULATORY_CARE_PROVIDER_SITE_OTHER): Payer: Medicare Other | Admitting: Internal Medicine

## 2021-08-19 ENCOUNTER — Encounter: Payer: Self-pay | Admitting: Internal Medicine

## 2021-08-19 ENCOUNTER — Other Ambulatory Visit: Payer: Self-pay

## 2021-08-19 VITALS — BP 128/82 | HR 63 | Resp 18 | Ht 61.0 in | Wt 155.4 lb

## 2021-08-19 DIAGNOSIS — E1169 Type 2 diabetes mellitus with other specified complication: Secondary | ICD-10-CM | POA: Diagnosis not present

## 2021-08-19 LAB — POCT GLYCOSYLATED HEMOGLOBIN (HGB A1C): Hemoglobin A1C: 6.6 % — AB (ref 4.0–5.6)

## 2021-08-19 NOTE — Assessment & Plan Note (Signed)
HgA1c improved to 6.6 today so will continue metformin 500 mg daily. Weight is down 5 pounds and she will continue with dietary changes from nutrition session.

## 2021-08-19 NOTE — Progress Notes (Addendum)
   Subjective:   Patient ID: Amber Roman, female    DOB: January 13, 1946, 75 y.o.   MRN: [id number]  HPI The patient is a 75 YO female coming in for follow up starting metformin for diabetes. Taking 500 mg daily and doing well with this. No side effects.  Review of Systems  Constitutional: Negative.   HENT: Negative.    Eyes: Negative.   Respiratory:  Negative for cough, chest tightness and shortness of breath.   Cardiovascular:  Negative for chest pain, palpitations and leg swelling.  Gastrointestinal:  Negative for abdominal distention, abdominal pain, constipation, diarrhea, nausea and vomiting.  Musculoskeletal: Negative.   Skin: Negative.   Neurological: Negative.   Psychiatric/Behavioral: Negative.     Objective:  Physical Exam Constitutional:      Appearance: She is well-developed.  HENT:     Head: Normocephalic and atraumatic.  Cardiovascular:     Rate and Rhythm: Normal rate and regular rhythm.  Pulmonary:     Effort: Pulmonary effort is normal. No respiratory distress.     Breath sounds: Normal breath sounds. No wheezing or rales.  Abdominal:     General: Bowel sounds are normal. There is no distension.     Palpations: Abdomen is soft.     Tenderness: There is no abdominal tenderness. There is no rebound.  Musculoskeletal:     Cervical back: Normal range of motion.  Skin:    General: Skin is warm and dry.  Neurological:     Mental Status: She is alert and oriented to person, place, and time.     Coordination: Coordination normal.    Vitals:   08/19/21 1008  BP: 128/82  Pulse: 63  Resp: 18  SpO2: 93%  Weight: 155 lb 6.4 oz (70.5 kg)  Height: '5\' 1"'$  (1.549 m)    This visit occurred during the SARS-CoV-2 public health emergency.  Safety protocols were in place, including screening questions prior to the visit, additional usage of staff PPE, and extensive cleaning of exam room while observing appropriate contact time as indicated for disinfecting solutions.    Assessment & Plan:

## 2021-08-19 NOTE — Patient Instructions (Addendum)
Your HgA1c is 6.6 today which is great. Keep taking the metformin once a day.

## 2021-08-23 NOTE — Progress Notes (Signed)
  Subjective:  Patient ID: Amber Roman, female    DOB: [birth year],  MRN: [id number]  Chief Complaint  Patient presents with   Diabetes    Diabetic foot care, A1C  7.9    75 y.o. female presents with the above complaint. History confirmed with patient.  She also has macular degeneration and cannot see well enough to trim her own nails  Objective:  Physical Exam: warm, good capillary refill, no trophic changes or ulcerative lesions, normal DP and PT pulses, and normal sensory exam. Left Foot: dystrophic yellowed discolored nail plates with subungual debris Right Foot: dystrophic yellowed discolored nail plates with subungual debris  Assessment:   1. Pain due to onychomycosis of toenails of both feet   2. Type 2 diabetes mellitus with other specified complication, without long-term current use of insulin ([place])      Plan:  Patient was evaluated and treated and all questions answered.  Patient educated on diabetes. Discussed proper diabetic foot care and discussed risks and complications of disease. Educated patient in depth on reasons to return to the office immediately should he/she discover anything concerning or new on the feet. All questions answered. Discussed proper shoes as well.   Discussed the etiology and treatment options for the condition in detail with the patient. Educated patient on the topical and oral treatment options for mycotic nails. Recommended debridement of the nails today. Sharp and mechanical debridement performed of all painful and mycotic nails today. Nails debrided in length and thickness using a nail nipper to level of comfort. Discussed treatment options including appropriate shoe gear. Follow up as needed for painful nails.  She spends her winters in [place] and she will call for follow-up for an appointment with me when she returns.  She found a podiatrist to take care of her there during the winter  Return for call when you return from [place] for an  appointment .

## 2021-09-08 NOTE — Progress Notes (Signed)
HPI  female former smoker followed for asthma/bronchitis, rhinitis, allergic conjunctivitis, complicated by OSA ([facility]/ Dr Brett Fairy), GERD PFT 09/17/17-minimal diffusion defect.  FVC 2.50/96%, FEV1 2.13/109%, ratio 0.85, FEF 25-75% 2.63/163%, no response to dilator.  TLC 92%, DLCO 77%.  ---------------------------------------------------------------------------  05/05/21- 75 year old female former smoker followed for asthma/bronchitis, rhinitis, allergic conjunctivitis, complicated by OSA ([facility]), GERD, HTN,  CPAP 7/ managed by Neurology Dr Brett Fairy. Trazodone from PCP helps occ insomnia.  - Breo 100, Proair hfa, flonase, Trazodone -----Wheezing. Memory Dance is 400 a month, needs to switch Download- compliance 100%, AHI 2.2/ hr Body weight today- 160 lbs Noting a little wheeze. Spent winter in [place] then [place] go to mountains for summer. Asks update CXR for watch due to hx smoking with no new symptom concern. CXR 05/11/20-  IMPRESSION: No radiographic evidence of acute cardiopulmonary disease.  09/12/21- 75 year old female former smoker followed for asthma/bronchitis, rhinitis, allergic conjunctivitis, Lung Nodules, complicated by OSA ([facility]), GERD, HTN,  CAD, Aortic Atherosclerosis, Macular Degeneration CPAP 7/ managed by Neurology Dr Brett Fairy. Trazodone from PCP helps occ insomnia.  -Symbicort 160, Proair hfa, flonase, Trazodone, Neb Duoneb,  Covid vax-4 Modernaa ------Asthma f/u, request flu vaccine today She winters in [place], [place] at a cabin in [place]. Seeking manufacturer's assistance for Trelegy from pharmacist working with her primary physician. Has felt well controlled with no significant exacerbation. Neurology continues to manage her OSA CT reviewed, discussing multiple small nodules and CAD.[name] CT chest 05/21/21-  IMPRESSION: 1. Multiple bilateral small solid and ground-glass pulmonary nodules measuring up to 5 mm. No follow-up needed if patient is low-risk (and has no known or  suspected primary neoplasm). Non-contrast chest CT can be considered in 12 months if patient is high-risk. This recommendation follows the consensus statement: Guidelines for Management of Incidental Pulmonary Nodules Detected on CT Images: From the Fleischner Society 2017; Radiology 2017; 284:228-243. 2. Small peripheral ground-glass opacity in the left upper lobe may reflect an infectious or inflammatory process. 3. Three-vessel coronary artery calcifications. 4. Aortic atherosclerosis. Aortic Atherosclerosis (ICD10-I70.0).  ROS-see HPI + = positive Constitutional:   No-   weight loss, night sweats, fevers, chills, fatigue, lassitude. HEENT:   No-  headaches, difficulty swallowing, tooth/dental problems, +sore throat,     No- sneezing, itching, ear ache,             nasal congestion, post nasal drip,  CV:  No-   chest pain, orthopnea, PND, swelling in lower extremities, anasarca, dizziness, palpitations Resp: No-   shortness of breath with exertion or at rest.                No- coughing up of blood,  productive cough, +nonproductive cough            change in color of mucus.  + Occasional wheezing.   Skin: No-   rash or lesions. GI:  No-   heartburn, indigestion, abdominal pain, nausea, vomiting,  GU:   Neuro-     nothing unusual Psych:  No- change in mood or affect. No depression or anxiety.  No memory loss.  OBJ General- Alert, Oriented, Affect-appropriate, Distress- none acute, + overweight Skin- rash-none, lesions- none, excoriation- none Lymphadenopathy- none Head- atraumatic            Eyes- Gross vision intact, PERRLA, conjunctivae look pale, clear secretions            Ears- Hearing,             Nose- , no-Septal dev, mucus, polyps,  erosion, perforation             Throat- Mallampati III , mucosa clear , drainage- none, tonsils- atrophic Neck- flexible , trachea midline, no stridor , thyroid nl, carotid no bruit Chest - symmetrical excursion , unlabored            Heart/CV- RRR , no murmur , no gallop  , no rub, nl s1 s2                           - JVD- none , edema- none, stasis changes- none, varices- none           Lung-  Wheeze-none, cough-none, dullness-none, rub- none           Chest wall-  Abd-  Br/ Gen/ Rectal- Not done, not indicated Extrem- cyanosis- none, clubbing, none, atrophy- none, strength- nl Neuro- grossly intact to observation. No nystagmus, and no carotid bruit.

## 2021-09-12 ENCOUNTER — Telehealth: Payer: Self-pay | Admitting: Pharmacist

## 2021-09-12 ENCOUNTER — Encounter: Payer: Self-pay | Admitting: Internal Medicine

## 2021-09-12 ENCOUNTER — Ambulatory Visit (INDEPENDENT_AMBULATORY_CARE_PROVIDER_SITE_OTHER): Payer: Medicare Other | Admitting: Internal Medicine

## 2021-09-12 ENCOUNTER — Other Ambulatory Visit: Payer: Self-pay

## 2021-09-12 VITALS — BP 118/76 | HR 65 | Temp 97.8°F | Ht 61.0 in | Wt 154.0 lb

## 2021-09-12 DIAGNOSIS — R918 Other nonspecific abnormal finding of lung field: Secondary | ICD-10-CM | POA: Diagnosis not present

## 2021-09-12 DIAGNOSIS — J4520 Mild intermittent asthma, uncomplicated: Secondary | ICD-10-CM

## 2021-09-12 DIAGNOSIS — Z23 Encounter for immunization: Secondary | ICD-10-CM

## 2021-09-12 DIAGNOSIS — G4733 Obstructive sleep apnea (adult) (pediatric): Secondary | ICD-10-CM | POA: Diagnosis not present

## 2021-09-12 DIAGNOSIS — Z9989 Dependence on other enabling machines and devices: Secondary | ICD-10-CM

## 2021-09-12 DIAGNOSIS — J4540 Moderate persistent asthma, uncomplicated: Secondary | ICD-10-CM | POA: Diagnosis not present

## 2021-09-12 MED ORDER — DIPHENOXYLATE-ATROPINE 2.5-0.025 MG PO TABS
1.0000 | ORAL_TABLET | Freq: Four times a day (QID) | ORAL | 5 refills | Status: DC | PRN
Start: 2021-09-12 — End: 2023-04-19

## 2021-09-12 MED ORDER — BUDESONIDE-FORMOTEROL FUMARATE 160-4.5 MCG/ACT IN AERO: 2.0000 | INHALATION_SPRAY | Freq: Two times a day (BID) | RESPIRATORY_TRACT | 3 refills | Status: AC

## 2021-09-12 MED ORDER — BUDESONIDE-FORMOTEROL FUMARATE 160-4.5 MCG/ACT IN AERO: 2.0000 | INHALATION_SPRAY | Freq: Two times a day (BID) | RESPIRATORY_TRACT | 3 refills | Status: DC

## 2021-09-12 NOTE — Patient Instructions (Addendum)
We can continue current meds.. If you need our help seeking manufacturer's assistance for something cheaper than Symbicort, please let us know.   I hope [place] will be in good shape when you get there.   Order Flu vax senior

## 2021-09-12 NOTE — Telephone Encounter (Signed)
Patient called for updated on Symbicort pt assistance through [employer]. Application was faxed 08/14/82 and we have not heard back.  Enrolled patient in [employer] via online portal, she was approved through 12/17/21, they just require an Rx faxed to [phone].  Printed Rx and faxed to [employer].

## 2021-09-13 DIAGNOSIS — H53413 Scotoma involving central area, bilateral: Secondary | ICD-10-CM | POA: Diagnosis not present

## 2021-09-13 NOTE — Progress Notes (Signed)
    Chronic Care Management Pharmacy Assistant   Name: Amber Roman  MRN: [id number] DOB: 1946/08/30  Called and left message with patient that patient assistance for Symbicort has been approved and that inhaler will be mailed to [place] address.  [place] Pharmacist Assistant [phone]   Time spent:5

## 2021-09-14 ENCOUNTER — Other Ambulatory Visit: Payer: Self-pay

## 2021-09-14 ENCOUNTER — Encounter (INDEPENDENT_AMBULATORY_CARE_PROVIDER_SITE_OTHER): Payer: Medicare Other | Admitting: Ophthalmology

## 2021-09-14 DIAGNOSIS — H35033 Hypertensive retinopathy, bilateral: Secondary | ICD-10-CM | POA: Diagnosis not present

## 2021-09-14 DIAGNOSIS — H43813 Vitreous degeneration, bilateral: Secondary | ICD-10-CM | POA: Diagnosis not present

## 2021-09-14 DIAGNOSIS — I10 Essential (primary) hypertension: Secondary | ICD-10-CM | POA: Diagnosis not present

## 2021-09-14 DIAGNOSIS — H353231 Exudative age-related macular degeneration, bilateral, with active choroidal neovascularization: Secondary | ICD-10-CM

## 2021-09-14 DIAGNOSIS — H353221 Exudative age-related macular degeneration, left eye, with active choroidal neovascularization: Secondary | ICD-10-CM | POA: Diagnosis not present

## 2021-09-28 ENCOUNTER — Telehealth: Payer: Self-pay | Admitting: Internal Medicine

## 2021-09-28 NOTE — Telephone Encounter (Signed)
Patient states she has not received rx budesonide-formoterol (SYMBICORT) 160-4.5 MCG/ACT inhaler  Please advise

## 2021-09-30 ENCOUNTER — Telehealth: Payer: Self-pay | Admitting: Pharmacist

## 2021-09-30 NOTE — Progress Notes (Signed)
    Chronic Care Management Pharmacy Assistant   Name: Amber Roman  MRN: [id number] DOB: 12-02-1946  Made a call to patient this morning letting her know that her prescription for Symbicort from [employer] has been shipped and she will receive it soon. Left message for patient to return call   [place] Pharmacist Assistant [phone]

## 2021-10-03 DIAGNOSIS — H353221 Exudative age-related macular degeneration, left eye, with active choroidal neovascularization: Secondary | ICD-10-CM | POA: Diagnosis not present

## 2021-10-18 DIAGNOSIS — G629 Polyneuropathy, unspecified: Secondary | ICD-10-CM | POA: Diagnosis not present

## 2021-10-18 DIAGNOSIS — M1990 Unspecified osteoarthritis, unspecified site: Secondary | ICD-10-CM | POA: Diagnosis not present

## 2021-10-18 DIAGNOSIS — N390 Urinary tract infection, site not specified: Secondary | ICD-10-CM | POA: Diagnosis not present

## 2021-10-18 DIAGNOSIS — E119 Type 2 diabetes mellitus without complications: Secondary | ICD-10-CM | POA: Diagnosis not present

## 2021-10-18 DIAGNOSIS — E559 Vitamin D deficiency, unspecified: Secondary | ICD-10-CM | POA: Diagnosis not present

## 2021-10-18 DIAGNOSIS — R5383 Other fatigue: Secondary | ICD-10-CM | POA: Diagnosis not present

## 2021-10-18 DIAGNOSIS — E039 Hypothyroidism, unspecified: Secondary | ICD-10-CM | POA: Diagnosis not present

## 2021-10-18 DIAGNOSIS — E1169 Type 2 diabetes mellitus with other specified complication: Secondary | ICD-10-CM | POA: Diagnosis not present

## 2021-10-24 DIAGNOSIS — H353221 Exudative age-related macular degeneration, left eye, with active choroidal neovascularization: Secondary | ICD-10-CM | POA: Diagnosis not present

## 2021-10-26 DIAGNOSIS — E119 Type 2 diabetes mellitus without complications: Secondary | ICD-10-CM | POA: Diagnosis not present

## 2021-10-26 DIAGNOSIS — G629 Polyneuropathy, unspecified: Secondary | ICD-10-CM | POA: Diagnosis not present

## 2021-10-26 DIAGNOSIS — M1990 Unspecified osteoarthritis, unspecified site: Secondary | ICD-10-CM | POA: Diagnosis not present

## 2021-10-26 DIAGNOSIS — E02 Subclinical iodine-deficiency hypothyroidism: Secondary | ICD-10-CM | POA: Diagnosis not present

## 2021-10-31 ENCOUNTER — Other Ambulatory Visit: Payer: Self-pay | Admitting: Internal Medicine

## 2021-11-03 DIAGNOSIS — H26491 Other secondary cataract, right eye: Secondary | ICD-10-CM | POA: Diagnosis not present

## 2021-11-15 DIAGNOSIS — H26493 Other secondary cataract, bilateral: Secondary | ICD-10-CM | POA: Diagnosis not present

## 2021-11-15 DIAGNOSIS — H353221 Exudative age-related macular degeneration, left eye, with active choroidal neovascularization: Secondary | ICD-10-CM | POA: Diagnosis not present

## 2021-11-24 DIAGNOSIS — H3530 Unspecified macular degeneration: Secondary | ICD-10-CM | POA: Diagnosis not present

## 2021-11-24 DIAGNOSIS — I10 Essential (primary) hypertension: Secondary | ICD-10-CM | POA: Diagnosis not present

## 2021-11-24 DIAGNOSIS — E119 Type 2 diabetes mellitus without complications: Secondary | ICD-10-CM | POA: Diagnosis not present

## 2021-11-24 DIAGNOSIS — R350 Frequency of micturition: Secondary | ICD-10-CM | POA: Diagnosis not present

## 2021-11-28 DIAGNOSIS — H353221 Exudative age-related macular degeneration, left eye, with active choroidal neovascularization: Secondary | ICD-10-CM | POA: Diagnosis not present

## 2021-12-13 DIAGNOSIS — H353221 Exudative age-related macular degeneration, left eye, with active choroidal neovascularization: Secondary | ICD-10-CM | POA: Diagnosis not present

## 2021-12-24 ENCOUNTER — Encounter: Payer: Self-pay | Admitting: Internal Medicine

## 2021-12-24 DIAGNOSIS — R911 Solitary pulmonary nodule: Secondary | ICD-10-CM | POA: Insufficient documentation

## 2021-12-24 DIAGNOSIS — R918 Other nonspecific abnormal finding of lung field: Secondary | ICD-10-CM | POA: Insufficient documentation

## 2021-12-24 NOTE — Assessment & Plan Note (Signed)
I advised her we would probably recommend follow-up CT next year.

## 2021-12-24 NOTE — Assessment & Plan Note (Signed)
Asthmatic bronchitis mild, uncomplicated.  I do think she will be better with a maintenance controller. Plan-she will follow-up with pharmacist about manufacturer support for Trelegy

## 2021-12-24 NOTE — Assessment & Plan Note (Signed)
Managed by neurology

## 2021-12-25 ENCOUNTER — Other Ambulatory Visit: Payer: Self-pay | Admitting: Internal Medicine

## 2022-01-05 DIAGNOSIS — H353231 Exudative age-related macular degeneration, bilateral, with active choroidal neovascularization: Secondary | ICD-10-CM | POA: Diagnosis not present

## 2022-02-04 ENCOUNTER — Other Ambulatory Visit: Payer: Self-pay | Admitting: Internal Medicine

## 2022-02-10 DIAGNOSIS — H353231 Exudative age-related macular degeneration, bilateral, with active choroidal neovascularization: Secondary | ICD-10-CM | POA: Diagnosis not present

## 2022-03-17 DIAGNOSIS — H353231 Exudative age-related macular degeneration, bilateral, with active choroidal neovascularization: Secondary | ICD-10-CM | POA: Diagnosis not present

## 2022-04-17 IMAGING — DX DG CHEST 2V
2 series · 2 of 2 positions shown · non-contrast
Comparison: Chest x-ray 09/03/2017.

CLINICAL DATA: 73-year-old female with history of persistent
asthma. Former smoker.

EXAM:
CHEST - 2 VIEW

[chest pa]
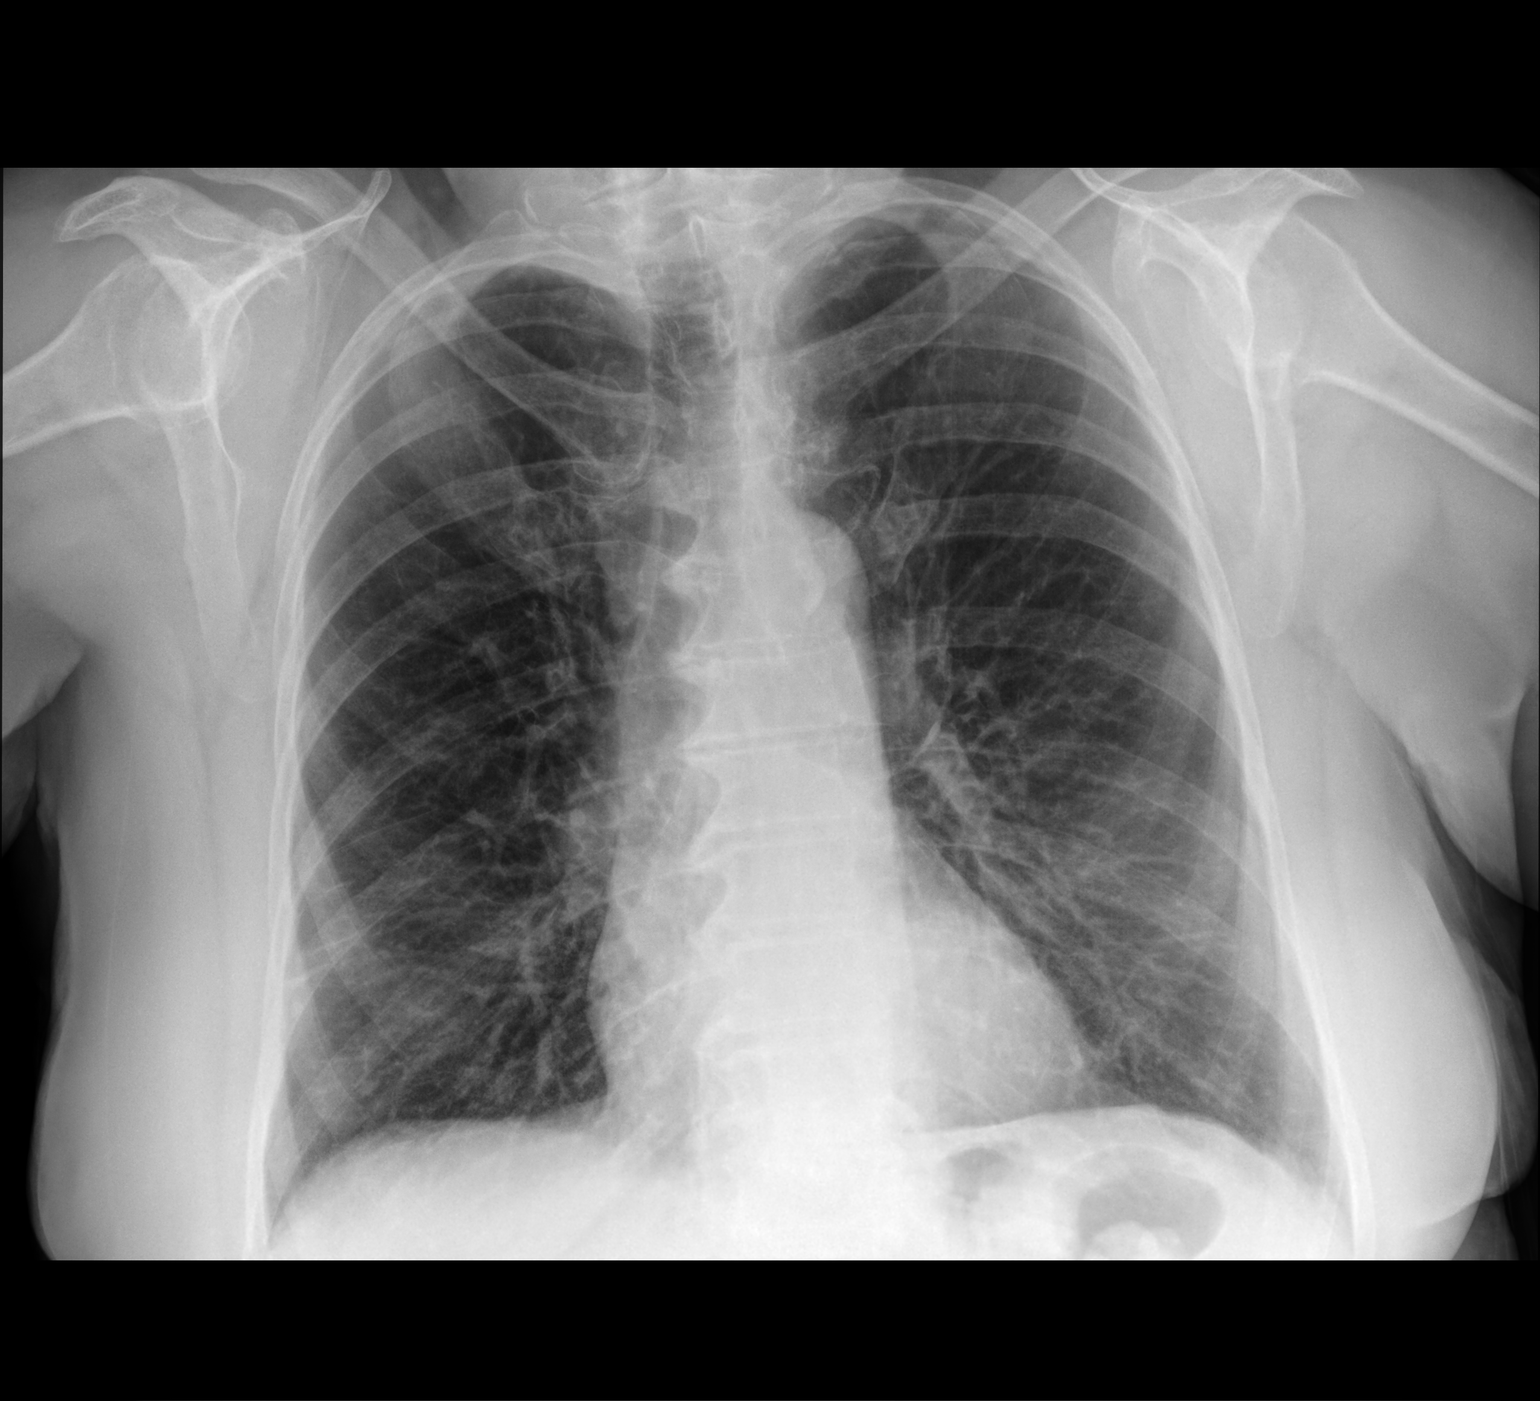

[chest lat]
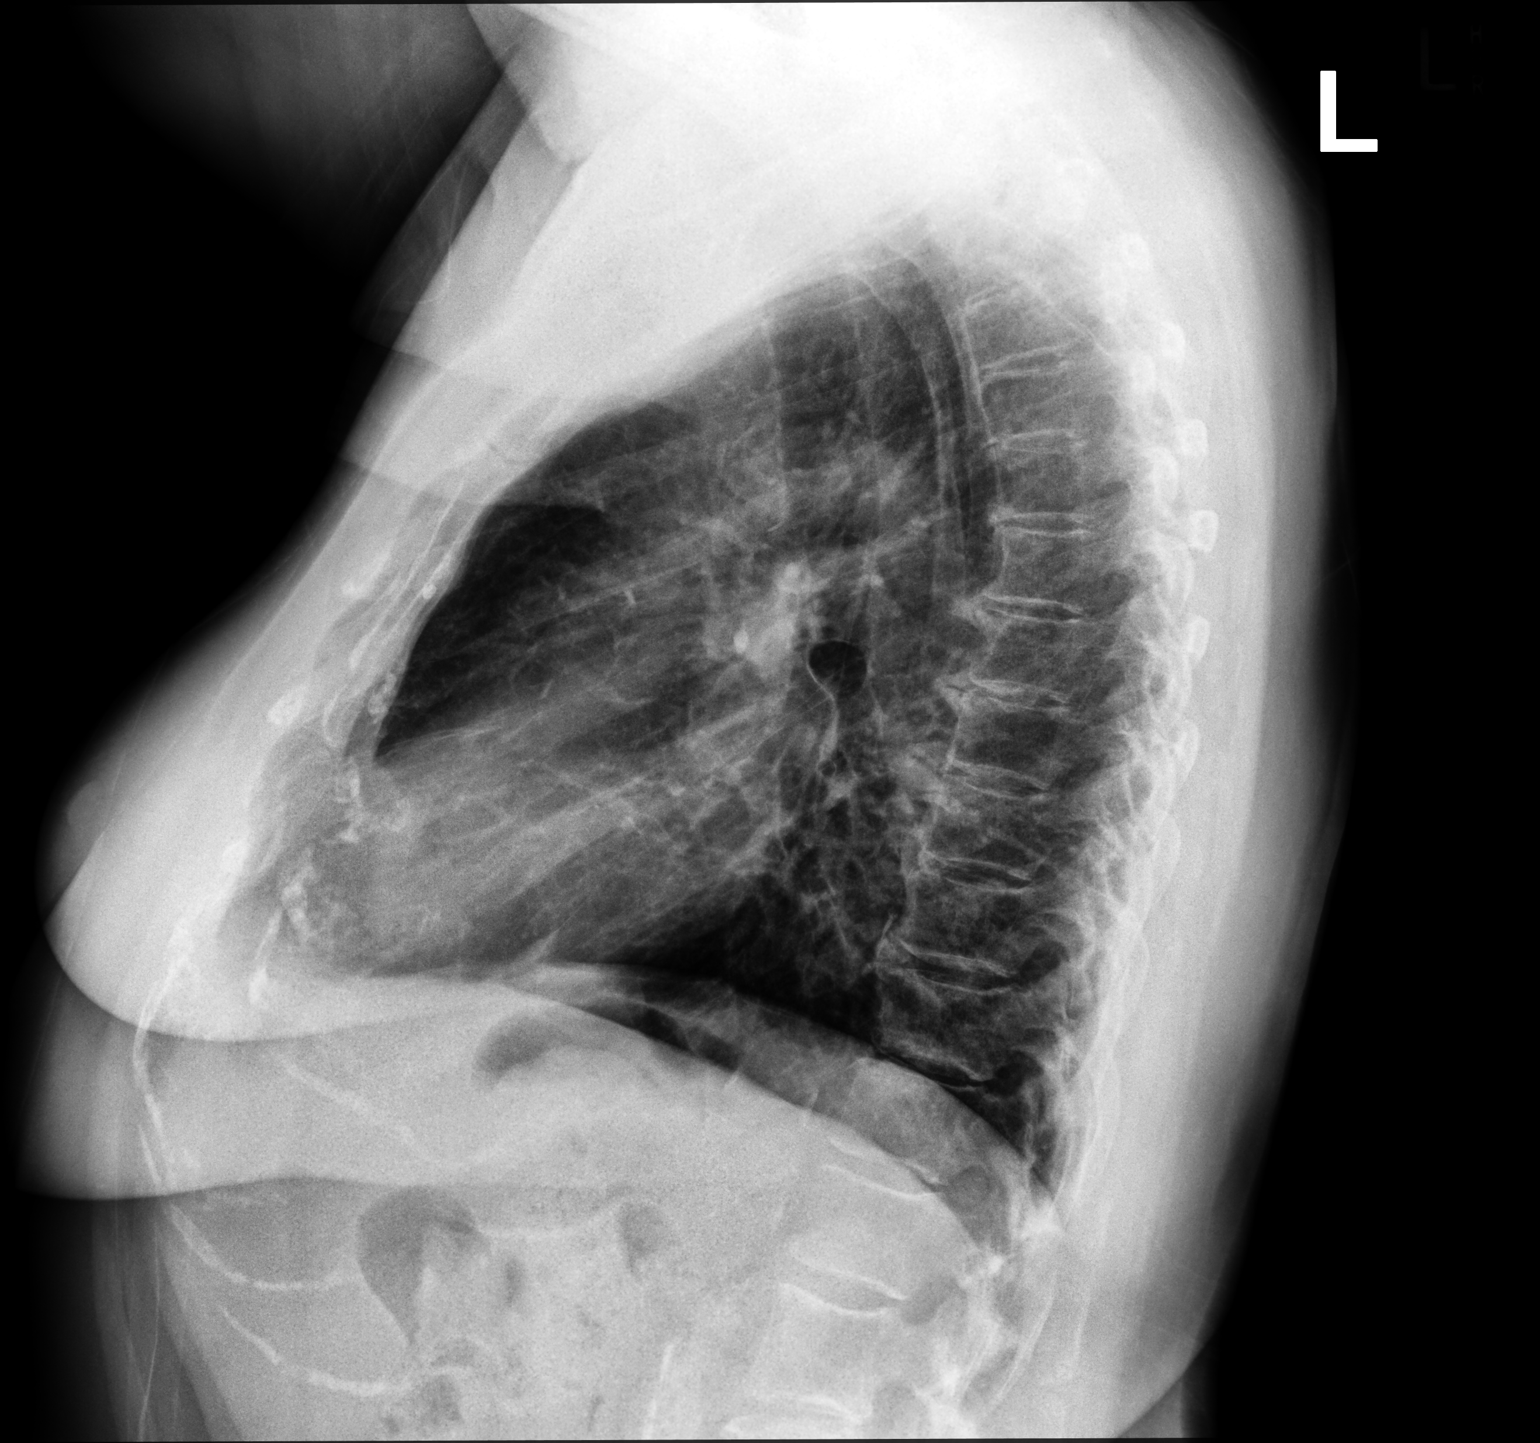

[2 of 2 positions shown; findings below may reference images not displayed]

FINDINGS: Lung volumes are normal. No consolidative airspace disease. No
pleural effusions. No pneumothorax. No pulmonary nodule or mass
noted. Pulmonary vasculature and the cardiomediastinal silhouette
are within normal limits.
IMPRESSION: No radiographic evidence of acute cardiopulmonary disease.

## 2022-04-21 ENCOUNTER — Ambulatory Visit (HOSPITAL_COMMUNITY): Payer: Medicare Other

## 2022-04-22 NOTE — Progress Notes (Signed)
HPI ? female former smoker followed for asthma/bronchitis, rhinitis, allergic conjunctivitis, complicated by OSA ([facility]/ Dr Brett Fairy), GERD ?PFT 09/17/17-minimal diffusion defect.  FVC 2.50/96%, FEV1 2.13/109%, ratio 0.85, FEF 25-75% 2.63/163%, no response to dilator.  TLC 92%, DLCO 77%. ? ?--------------------------------------------------------------------------- ? ? ?09/12/21- 76 year old female former smoker followed for asthma/bronchitis, rhinitis, allergic conjunctivitis, Lung Nodules, complicated by OSA ([facility]), GERD, HTN,  CAD, Aortic Atherosclerosis, Macular Degeneration ?CPAP 7/ managed by Neurology Dr Brett Fairy. Trazodone from PCP helps occ insomnia.  ?-Symbicort 160, Proair hfa, flonase, Trazodone, Neb Duoneb,  ?Covid vax-4 Modernaa ?------Asthma f/u, request flu vaccine today ?She winters in [place], [place] at a cabin in [place]. ?Seeking manufacturer's assistance for Trelegy from pharmacist working with her primary physician. ?Has felt well controlled with no significant exacerbation. ?Neurology continues to manage her OSA ?CT reviewed, discussing multiple small nodules and CAD.[name] ?CT chest 05/21/21-  ?IMPRESSION: ?1. Multiple bilateral small solid and ground-glass pulmonary nodules ?measuring up to 5 mm. No follow-up needed if patient is low-risk ?(and has no known or suspected primary neoplasm). Non-contrast chest ?CT can be considered in 12 months if patient is high-risk. This ?recommendation follows the consensus statement: Guidelines for ?Management of Incidental Pulmonary Nodules Detected on CT Images: ?From the Fleischner Society 2017; Radiology 2017; 284:228-243. ?2. Small peripheral ground-glass opacity in the left upper lobe may ?reflect an infectious or inflammatory process. ?3. Three-vessel coronary artery calcifications. ?4. Aortic atherosclerosis. ?Aortic Atherosclerosis (ICD10-I70.0). ? ?04/24/22- 76 year old female former smoker followed for asthma/bronchitis, rhinitis, allergic  conjunctivitis, Lung Nodules, complicated by OSA ([facility]), GERD, HTN,  CAD/ Aortic Atherosclerosis, Macular Degeneration ?CPAP 7/ managed by Neurology Dr Brett Fairy. Trazodone from PCP helps occ insomnia.  ?-Symbicort 160, Proair hfa ?Covid vax-4 moderna ?Flu vax-had ?-----Patient would like CXR or something to follow up on Nodule. Otherwise doing good. ?She missed her appointment for follow-up CT scan.  We discussed at our rescheduling that tract lung nodules.  CPAP is managed by neurology. ?She says her breathing is doing well with Symbicort and occasional use of rescue inhaler, no acute events.  [place] the mountains and winter in [place] where she says she "breathes better". ? ? ?ROS-see HPI + = positive ?Constitutional:   No-   weight loss, night sweats, fevers, chills, fatigue, lassitude. ?HEENT:   No-  headaches, difficulty swallowing, tooth/dental problems, +sore throat,  ?   No- sneezing, itching, ear ache,  ?           nasal congestion, post nasal drip,  ?CV:  No-   chest pain, orthopnea, PND, swelling in lower extremities, anasarca, dizziness, palpitations ?Resp: No-   shortness of breath with exertion or at rest.   ?             No- coughing up of blood,  productive cough, +nonproductive cough ?           change in color of mucus.  + Occasional wheezing.   ?Skin: No-   rash or lesions. ?GI:  No-   heartburn, indigestion, abdominal pain, nausea, vomiting,  ?GU:  ? Neuro-     nothing unusual ?Psych:  No- change in mood or affect. No depression or anxiety.  No memory loss. ? ?OBJ ?General- Alert, Oriented, Affect-appropriate, Distress- none acute, + overweight ?Skin- rash-none, lesions- none, excoriation- none ?Lymphadenopathy- none ?Head- atraumatic ?           Eyes- Gross vision intact, PERRLA, conjunctivae look pale, clear secretions ?  Ears- Hearing,  ?           Nose- , no-Septal dev, mucus, polyps, erosion, perforation  ?           Throat- Mallampati III , mucosa clear ,  drainage- none, tonsils- atrophic ?Neck- flexible , trachea midline, no stridor , thyroid nl, carotid no bruit ?Chest - symmetrical excursion , unlabored ?          Heart/CV- RRR , no murmur , no gallop  , no rub, nl s1 s2 ?                          - JVD- none , edema- none, stasis changes- none, varices- none ?          Lung-  Wheeze-none, cough-none, dullness-none, rub- none ?          Chest wall-  ?Abd-  ?Br/ Gen/ Rectal- Not done, not indicated ?Extrem- cyanosis- none, clubbing, none, atrophy- none, strength- nl ?Neuro- grossly intact to observation. No nystagmus, and no carotid bruit. ? ? ? ? ?

## 2022-04-24 ENCOUNTER — Encounter: Payer: Self-pay | Admitting: Internal Medicine

## 2022-04-24 ENCOUNTER — Ambulatory Visit (INDEPENDENT_AMBULATORY_CARE_PROVIDER_SITE_OTHER): Payer: Medicare Other | Admitting: Internal Medicine

## 2022-04-24 ENCOUNTER — Encounter (INDEPENDENT_AMBULATORY_CARE_PROVIDER_SITE_OTHER): Payer: Medicare Other | Admitting: Ophthalmology

## 2022-04-24 DIAGNOSIS — I10 Essential (primary) hypertension: Secondary | ICD-10-CM

## 2022-04-24 DIAGNOSIS — H353231 Exudative age-related macular degeneration, bilateral, with active choroidal neovascularization: Secondary | ICD-10-CM | POA: Diagnosis not present

## 2022-04-24 DIAGNOSIS — H43813 Vitreous degeneration, bilateral: Secondary | ICD-10-CM | POA: Diagnosis not present

## 2022-04-24 DIAGNOSIS — J4540 Moderate persistent asthma, uncomplicated: Secondary | ICD-10-CM

## 2022-04-24 DIAGNOSIS — R918 Other nonspecific abnormal finding of lung field: Secondary | ICD-10-CM

## 2022-04-24 DIAGNOSIS — H35033 Hypertensive retinopathy, bilateral: Secondary | ICD-10-CM | POA: Diagnosis not present

## 2022-04-24 LAB — HM DIABETES EYE EXAM

## 2022-04-24 NOTE — Patient Instructions (Signed)
Order- CT chest no contrast    dx multiple lung nodules ? ?Ok to continue Symbicort and the albuterol rescue inhaler ? ?Please call if we can help ?

## 2022-04-24 NOTE — Assessment & Plan Note (Signed)
We discussed multiple lung nodules noted on CT last year.  She is interested in following up as we had discussed.  We will reschedule the missed appointment. ?Plan-CT chest ?

## 2022-04-24 NOTE — Assessment & Plan Note (Signed)
Controlled and stable.  Symbicort and occasional use of rescue inhaler are sufficient.  She travels seasonally for the weather as noted and believes this helps her. ?Plan-continue current meds. ?

## 2022-04-26 DIAGNOSIS — Z961 Presence of intraocular lens: Secondary | ICD-10-CM | POA: Diagnosis not present

## 2022-04-26 DIAGNOSIS — H02403 Unspecified ptosis of bilateral eyelids: Secondary | ICD-10-CM | POA: Diagnosis not present

## 2022-04-26 DIAGNOSIS — H52203 Unspecified astigmatism, bilateral: Secondary | ICD-10-CM | POA: Diagnosis not present

## 2022-04-27 ENCOUNTER — Ambulatory Visit (HOSPITAL_COMMUNITY)
Admission: RE | Admit: 2022-04-27 | Discharge: 2022-04-27 | Disposition: A | Discharge status: Home / Self Care | Payer: Medicare Other | Source: Ambulatory Visit | Attending: Internal Medicine | Admitting: Internal Medicine

## 2022-04-27 ENCOUNTER — Ambulatory Visit (INDEPENDENT_AMBULATORY_CARE_PROVIDER_SITE_OTHER): Payer: Medicare Other | Admitting: Podiatry

## 2022-04-27 DIAGNOSIS — M79675 Pain in left toe(s): Secondary | ICD-10-CM | POA: Diagnosis not present

## 2022-04-27 DIAGNOSIS — M79674 Pain in right toe(s): Secondary | ICD-10-CM | POA: Diagnosis not present

## 2022-04-27 DIAGNOSIS — E1169 Type 2 diabetes mellitus with other specified complication: Secondary | ICD-10-CM | POA: Diagnosis not present

## 2022-04-27 DIAGNOSIS — R918 Other nonspecific abnormal finding of lung field: Secondary | ICD-10-CM | POA: Diagnosis not present

## 2022-04-27 DIAGNOSIS — R911 Solitary pulmonary nodule: Secondary | ICD-10-CM

## 2022-04-27 DIAGNOSIS — B351 Tinea unguium: Secondary | ICD-10-CM | POA: Diagnosis not present

## 2022-04-27 DIAGNOSIS — J4521 Mild intermittent asthma with (acute) exacerbation: Secondary | ICD-10-CM | POA: Diagnosis not present

## 2022-04-30 ENCOUNTER — Encounter: Payer: Self-pay | Admitting: Podiatry

## 2022-04-30 NOTE — Progress Notes (Signed)
?  Subjective:  ?Patient ID: Amber Roman, female    DOB: 01/10/1946,  MRN: [id number] ? ?Chief Complaint  ?Patient presents with  ? Nail Problem  ?  Thick painful toenails, 3 month follow up   ? ? ?76 y.o. female presents with the above complaint. History confirmed with patient.  She also has macular degeneration and cannot see well enough to trim her own nails.  The nails are thickened and elongated and she is diabetic.  Her blood sugar remains well controlled. ? ?Objective:  ?Physical Exam: ?warm, good capillary refill, no trophic changes or ulcerative lesions, normal DP and PT pulses, and normal sensory exam. ?Left Foot: dystrophic yellowed discolored nail plates with subungual debris ?Right Foot: dystrophic yellowed discolored nail plates with subungual debris ? ?Assessment:  ? ?1. Pain due to onychomycosis of toenails of both feet   ?2. Type 2 diabetes mellitus with other specified complication, without long-term current use of insulin ([place])   ? ? ? ?Plan:  ?Patient was evaluated and treated and all questions answered. ? ?Patient educated on diabetes. Discussed proper diabetic foot care and discussed risks and complications of disease. Educated patient in depth on reasons to return to the office immediately should he/she discover anything concerning or new on the feet. All questions answered. Discussed proper shoes as well.  ? ?Discussed the etiology and treatment options for the condition in detail with the patient. Educated patient on the topical and oral treatment options for mycotic nails. Recommended debridement of the nails today. Sharp and mechanical debridement performed of all painful and mycotic nails today. Nails debrided in length and thickness using a nail nipper to level of comfort. Discussed treatment options including appropriate shoe gear. Follow up as needed for painful nails. ? ?I will see her back in 3 months ? ?No follow-ups on file.  ? ? ?

## 2022-05-03 ENCOUNTER — Ambulatory Visit: Payer: Medicare Other | Admitting: Internal Medicine

## 2022-05-04 ENCOUNTER — Ambulatory Visit (INDEPENDENT_AMBULATORY_CARE_PROVIDER_SITE_OTHER): Payer: Medicare Other

## 2022-05-04 DIAGNOSIS — Z0000 Encounter for general adult medical examination without abnormal findings: Secondary | ICD-10-CM

## 2022-05-04 NOTE — Patient Instructions (Signed)
[name] , Thank you for taking time to come for your Medicare Wellness Visit. I appreciate your ongoing commitment to your health goals. Please review the following plan we discussed and let me know if I can assist you in the future.   Screening recommendations/referrals: Colonoscopy: Not a candidate for screening due to age 76: 08/17/2021; due every year Bone Density: 04/15/2018; due every 2-3 years Recommended yearly ophthalmology/optometry visit for glaucoma screening and checkup Recommended yearly dental visit for hygiene and checkup  Vaccinations: Influenza vaccine: 09/12/2021 Pneumococcal vaccine: 07/06/2014, 10/22/2018 Tdap vaccine: 06/19/2013; due every 10 years Shingles vaccine: 09/17/2020, 11/19/2020   Covid-19: 01/23/2020, 02/19/2020, 10/29/2020, 05/04/2021  Advanced directives: Yes; Please bring a copy of your health care power of attorney and living will to the office at your convenience.  Conditions/risks identified: Yes  Next appointment: Please schedule your next Medicare Wellness Visit with your Nurse Health Advisor in 1 year by calling [phone].   Preventive Care 29 Years and Older, Female Preventive care refers to lifestyle choices and visits with your health care provider that can promote health and wellness. What does preventive care include? A yearly physical exam. This is also called an annual well check. Dental exams once or twice a year. Routine eye exams. Ask your health care provider how often you should have your eyes checked. Personal lifestyle choices, including: Daily care of your teeth and gums. Regular physical activity. Eating a healthy diet. Avoiding tobacco and drug use. Limiting alcohol use. Practicing safe sex. Taking low-dose aspirin every day. Taking vitamin and mineral supplements as recommended by your health care provider. What happens during an annual well check? The services and screenings done by your health care provider during  your annual well check will depend on your age, overall health, lifestyle risk factors, and family history of disease. Counseling  Your health care provider may ask you questions about your: Alcohol use. Tobacco use. Drug use. Emotional well-being. Home and relationship well-being. Sexual activity. Eating habits. History of falls. Memory and ability to understand (cognition). Work and work Statistician. Reproductive health. Screening  You may have the following tests or measurements: Height, weight, and BMI. Blood pressure. Lipid and cholesterol levels. These may be checked every 5 years, or more frequently if you are over 64 years old. Skin check. Lung cancer screening. You may have this screening every year starting at age 28 if you have a 30-pack-year history of smoking and currently smoke or have quit within the past 15 years. Fecal occult blood test (FOBT) of the stool. You may have this test every year starting at age 43. Flexible sigmoidoscopy or colonoscopy. You may have a sigmoidoscopy every 5 years or a colonoscopy every 10 years starting at age 64. Hepatitis C blood test. Hepatitis B blood test. Sexually transmitted disease (STD) testing. Diabetes screening. This is done by checking your blood sugar (glucose) after you have not eaten for a while (fasting). You may have this done every 1-3 years. Bone density scan. This is done to screen for osteoporosis. You may have this done starting at age 1. Mammogram. This may be done every 1-2 years. Talk to your health care provider about how often you should have regular mammograms. Talk with your health care provider about your test results, treatment options, and if necessary, the need for more tests. Vaccines  Your health care provider may recommend certain vaccines, such as: Influenza vaccine. This is recommended every year. Tetanus, diphtheria, and acellular pertussis (Tdap, Td) vaccine. You may  need a Td booster every 10  years. Zoster vaccine. You may need this after age 61. Pneumococcal 13-valent conjugate (PCV13) vaccine. One dose is recommended after age 61. Pneumococcal polysaccharide (PPSV23) vaccine. One dose is recommended after age 54. Talk to your health care provider about which screenings and vaccines you need and how often you need them. This information is not intended to replace advice given to you by your health care provider. Make sure you discuss any questions you have with your health care provider. Document Released: 12/31/2015 Document Revised: 08/23/2016 Document Reviewed: 10/05/2015 Elsevier Interactive Patient Education  2017 [place] Prevention in the Home Falls can cause injuries. They can happen to people of all ages. There are many things you can do to make your home safe and to help prevent falls. What can I do on the outside of my home? Regularly fix the edges of walkways and driveways and fix any cracks. Remove anything that might make you trip as you walk through a door, such as a raised step or threshold. Trim any bushes or trees on the path to your home. Use bright outdoor lighting. Clear any walking paths of anything that might make someone trip, such as rocks or tools. Regularly check to see if handrails are loose or broken. Make sure that both sides of any steps have handrails. Any raised decks and porches should have guardrails on the edges. Have any leaves, snow, or ice cleared regularly. Use sand or salt on walking paths during winter. Clean up any spills in your garage right away. This includes oil or grease spills. What can I do in the bathroom? Use night lights. Install grab bars by the toilet and in the tub and shower. Do not use towel bars as grab bars. Use non-skid mats or decals in the tub or shower. If you need to sit down in the shower, use a plastic, non-slip stool. Keep the floor dry. Clean up any water that spills on the floor as soon as it  happens. Remove soap buildup in the tub or shower regularly. Attach bath mats securely with double-sided non-slip rug tape. Do not have throw rugs and other things on the floor that can make you trip. What can I do in the bedroom? Use night lights. Make sure that you have a light by your bed that is easy to reach. Do not use any sheets or blankets that are too big for your bed. They should not hang down onto the floor. Have a firm chair that has side arms. You can use this for support while you get dressed. Do not have throw rugs and other things on the floor that can make you trip. What can I do in the kitchen? Clean up any spills right away. Avoid walking on wet floors. Keep items that you use a lot in easy-to-reach places. If you need to reach something above you, use a strong step stool that has a grab bar. Keep electrical cords out of the way. Do not use floor polish or wax that makes floors slippery. If you must use wax, use non-skid floor wax. Do not have throw rugs and other things on the floor that can make you trip. What can I do with my stairs? Do not leave any items on the stairs. Make sure that there are handrails on both sides of the stairs and use them. Fix handrails that are broken or loose. Make sure that handrails are as long as the stairways.  Check any carpeting to make sure that it is firmly attached to the stairs. Fix any carpet that is loose or worn. Avoid having throw rugs at the top or bottom of the stairs. If you do have throw rugs, attach them to the floor with carpet tape. Make sure that you have a light switch at the top of the stairs and the bottom of the stairs. If you do not have them, ask someone to add them for you. What else can I do to help prevent falls? Wear shoes that: Do not have high heels. Have rubber bottoms. Are comfortable and fit you well. Are closed at the toe. Do not wear sandals. If you use a stepladder: Make sure that it is fully opened.  Do not climb a closed stepladder. Make sure that both sides of the stepladder are locked into place. Ask someone to hold it for you, if possible. Clearly mark and make sure that you can see: Any grab bars or handrails. First and last steps. Where the edge of each step is. Use tools that help you move around (mobility aids) if they are needed. These include: Canes. Walkers. Scooters. Crutches. Turn on the lights when you go into a dark area. Replace any light bulbs as soon as they burn out. Set up your furniture so you have a clear path. Avoid moving your furniture around. If any of your floors are uneven, fix them. If there are any pets around you, be aware of where they are. Review your medicines with your doctor. Some medicines can make you feel dizzy. This can increase your chance of falling. Ask your doctor what other things that you can do to help prevent falls. This information is not intended to replace advice given to you by your health care provider. Make sure you discuss any questions you have with your health care provider. Document Released: 09/30/2009 Document Revised: 05/11/2016 Document Reviewed: 01/08/2015 Elsevier Interactive Patient Education  2017 [employer].

## 2022-05-04 NOTE — Progress Notes (Signed)
I connected with Amber Roman today by telephone and verified that I am speaking with the correct person using two identifiers. Location patient: home Location provider: work Persons participating in the virtual visit: patient, provider.   I discussed the limitations, risks, security and privacy concerns of performing an evaluation and management service by telephone and the availability of in person appointments. I also discussed with the patient that there may be a patient responsible charge related to this service. The patient expressed understanding and verbally consented to this telephonic visit.    Interactive audio and video telecommunications were attempted between this provider and patient, however failed, due to patient having technical difficulties OR patient did not have access to video capability.  We continued and completed visit with audio only.  Some vital signs may be absent or patient reported.   Time Spent with patient on telephone encounter: 30 minutes  Subjective:   Amber Roman is a 76 y.o. female who presents for Medicare Annual (Subsequent) preventive examination.  Review of Systems     Cardiac Risk Factors include: advanced age (>11mn, >>66women);diabetes mellitus;dyslipidemia;family history of premature cardiovascular disease;hypertension     Objective:    There were no vitals filed for this visit. There is no height or weight on file to calculate BMI.     05/04/2022    9:50 AM 07/04/2021    2:00 PM 01/10/2013    2:06 PM 01/10/2013    6:13 AM 12/31/2012   10:03 AM  Advanced Directives  Does Patient Have a Medical Advance Directive? Yes Yes Patient has advance directive, copy not in chart Patient does not have advance directive   Type of Advance Directive HWelcomeLiving will    HSeldenLiving will  Does patient want to make changes to medical advance directive? No - Patient declined No - Patient declined     Copy  of HRosendalein Chart? No - copy requested      Pre-existing out of facility DNR order (yellow form or pink MOST form)   No  No    Current Medications (verified) Outpatient Encounter Medications as of 05/04/2022  Medication Sig   albuterol (PROAIR HFA) 108 (90 Base) MCG/ACT inhaler INHALE 2 PUFFS INTO THE LUNGS EVERY 6 HOURS AS NEEDED FOR WHEEZING OR SHORTNESS OF BREATH   azelastine (OPTIVAR) 0.05 % ophthalmic solution Place 1 drop into both eyes 2 (two) times daily.   b complex vitamins capsule Take 1 capsule by mouth daily.   budesonide-formoterol (SYMBICORT) 160-4.5 MCG/ACT inhaler Inhale 2 puffs into the lungs 2 (two) times daily. Via [employer] pt assistance   diphenoxylate-atropine (LOMOTIL) 2.5-0.025 MG tablet Take 1 tablet by mouth 4 (four) times daily as needed. For stomach   DULoxetine (CYMBALTA) 60 MG capsule Take 1 capsule by mouth daily.   fluticasone (FLONASE) 50 MCG/ACT nasal spray 1-2 puffs each nostril once daily while needed   gabapentin (NEURONTIN) 300 MG capsule Take 1 capsule (300 mg total) by mouth 2 (two) times daily.   guaiFENesin (MUCINEX) 600 MG 12 hr tablet Take 1,200 mg by mouth 2 (two) times daily as needed. For chest tightness   ipratropium-albuterol (DUONEB) 0.5-2.5 (3) MG/3ML SOLN Take 3 mLs by nebulization every 6 (six) hours as needed.   irbesartan-hydrochlorothiazide (AVALIDE) 300-12.5 MG tablet TAKE 1 TABLET BY MOUTH DAILY   meloxicam (MOBIC) 15 MG tablet TAKE 1 TABLET(15 MG) BY MOUTH DAILY   metFORMIN (GLUCOPHAGE) 500 MG tablet Take 500 mg  by mouth daily.   metoprolol succinate (TOPROL-XL) 100 MG 24 hr tablet TAKE 1 TABLET(100 MG) BY MOUTH DAILY   Multiple Vitamin (MULTIVITAMIN) tablet Take 1 tablet by mouth daily.   Multiple Vitamins-Minerals (PRESERVISION AREDS PO) Take by mouth daily.   Multiple Vitamins-Minerals (VITAMIN D3 COMPLETE PO) Take 50 mcg by mouth daily.   Nebulizers (COMPRESSOR NEBULIZER) MISC As directed   rosuvastatin  (CRESTOR) 20 MG tablet TAKE 1 TABLET(20 MG) BY MOUTH DAILY   traZODone (DESYREL) 50 MG tablet TAKE 1 TABLET(50 MG) BY MOUTH AT BEDTIME AS NEEDED FOR SLEEP   No facility-administered encounter medications on file as of 05/04/2022.    Allergies (verified) Sulfonamide derivatives   History: Past Medical History:  Diagnosis Date   Allergic conjunctivitis    Arthritis    Basal cell carcinoma 10/29/1997   ledt nasal ala ([facility])   Bronchitis    Bruxism, sleep-related 04/13/2014   Depression    Esophageal reflux    Family history of heart disease    H/O abdominal hysterectomy    w/o recurrence   Hyperlipidemia    Hypertension    Insomnia 04/13/2014   Obstructive sleep apnea    improved on C Pap   Squamous cell carcinoma of skin 09/20/2015   left upper arm (Txpbx)   Uterine cancer ([place])    hysterectomy   Past Surgical History:  Procedure Laterality Date   BILATERAL SALPINGOOPHORECTOMY     CARPAL TUNNEL RELEASE     bilateral   DOPPLER ECHOCARDIOGRAPHY     NASAL SEPTUM SURGERY     NM MYOVIEW LTD     ROTATOR CUFF REPAIR Right    TOTAL ABDOMINAL HYSTERECTOMY     TOTAL HIP ARTHROPLASTY  01/10/2013   Procedure: TOTAL HIP ARTHROPLASTY ANTERIOR APPROACH;  Surgeon: Mcarthur Rossetti, MD;  Location: [facility];  Service: Orthopedics;  Laterality: Right;   Family History  Problem Relation Age of Onset   Heart attack Father    High blood pressure Father    Multiple sclerosis Father    Lung disease Mother    Colon cancer Brother    High blood pressure Brother    Diabetes Mellitus II Sister    High blood pressure Sister    Social History   Socioeconomic History   Marital status: Single    Spouse name: Not on file   Number of children: 1   Years of education: College   Highest education level: Not on file  Occupational History   Not on file  Tobacco Use   Smoking status: Former    Packs/day: 2.00    Years: 15.00    Pack years: 30.00    Types: Cigarettes    Quit date:  12/18/1978    Years since quitting: 43.4   Smokeless tobacco: Never  Vaping Use   Vaping Use: Never used  Substance and Sexual Activity   Alcohol use: No   Drug use: No    Comment: hx of marijuana use    Sexual activity: Not on file  Other Topics Concern   Not on file  Social History Narrative   Patient is single.   Patient has a college education.   Patient drinks one caffeine drink per day.   Patient is right-handed.   Patient has one child.         Social Determinants of Health   Financial Resource Strain: Low Risk    Difficulty of Paying Living Expenses: Not hard at all  Food Insecurity: No Food  Insecurity   Worried About Charity fundraiser in the Last Year: Never true   [place] in the Last Year: Never true  Transportation Needs: No Transportation Needs   Lack of Transportation (Medical): No   Lack of Transportation (Non-Medical): No  Physical Activity: Sufficiently Active   Days of Exercise per Week: 5 days   Minutes of Exercise per Session: 30 min  Stress: No Stress Concern Present   Feeling of Stress : Not at all  Social Connections: Moderately Integrated   Frequency of Communication with Friends and Family: More than three times a week   Frequency of Social Gatherings with Friends and Family: More than three times a week   Attends Religious Services: More than 4 times per year   Active Member of [employer] or Organizations: Yes   Attends Music therapist: More than 4 times per year   Marital Status: Never married    Tobacco Counseling Counseling given: Not Answered   Clinical Intake:  Pre-visit preparation completed: Yes  Pain : No/denies pain     BMI - recorded: 29.34 Nutritional Risks: None Diabetes: Yes CBG done?: No Did pt. bring in CBG monitor from home?: No  How often do you need to have someone help you when you read instructions, pamphlets, or other written materials from your doctor or pharmacy?: 1 - Never What is the  last grade level you completed in school?: Bachelor's Degree  Diabetic? yes  Interpreter Needed?: No  Information entered by :: Lisette Abu, LPN   Activities of Daily Living    05/04/2022    9:58 AM  In your present state of health, do you have any difficulty performing the following activities:  Hearing? 0  Vision? 0  Difficulty concentrating or making decisions? 0  Walking or climbing stairs? 0  Dressing or bathing? 0  Doing errands, shopping? 0  Preparing Food and eating ? N  Using the Toilet? N  In the past six months, have you accidently leaked urine? N  Do you have problems with loss of bowel control? N  Managing your Medications? N  Managing your Finances? N  Housekeeping or managing your Housekeeping? N    Patient Care Team: Hoyt Koch, MD as PCP - General (Internal Medicine) Lorretta Harp, MD as PCP - Cardiology (Cardiology) Charlton Haws, [facility] as Pharmacist (Pharmacist) Warren Danes, PA-C as Physician Assistant (Dermatology)  Indicate any recent Medical Services you may have received from other than [facility] providers in the past year (date may be approximate).     Assessment:   This is a routine wellness examination for [place].  Hearing/Vision screen Hearing Screening - Comments:: Patient denied any hearing difficulty.   No hearing aids.  Vision Screening - Comments:: Patient does wear corrective lenses/contacts.  Eye exam done by: Dr. Luberta Mutter (Ophthalmologist) and Dr. Tempie Hoist (Retinal Specialist)   Dietary issues and exercise activities discussed: Current Exercise Habits: Home exercise routine, Type of exercise: walking, Time (Minutes): 30, Frequency (Times/Week): 7, Weekly Exercise (Minutes/Week): 210, Intensity: Moderate, Exercise limited by: None identified   Goals Addressed   None   Depression Screen    05/04/2022    9:54 AM 07/04/2021    2:01 PM 05/02/2021    1:12 PM 05/02/2021   12:58 PM 04/28/2020    10:14 AM 06/07/2018   10:37 AM  PHQ 2/9 Scores  PHQ - 2 Score 0 0 0 0 0 0    Fall Risk  05/04/2022    9:51 AM 07/04/2021    2:00 PM 05/02/2021   12:57 PM 04/28/2020   10:08 AM 09/23/2018    7:21 AM  Fall Risk   Falls in the past year? 1 0 1 0 No  Number falls in past yr: 0  0 0   Injury with Fall? 0  1 0   Risk for fall due to : No Fall Risks      Follow up Falls prevention discussed        FALL RISK PREVENTION PERTAINING TO THE HOME:  Any stairs in or around the home? No  If so, are there any without handrails? No  Home free of loose throw rugs in walkways, pet beds, electrical cords, etc? Yes  Adequate lighting in your home to reduce risk of falls? Yes   ASSISTIVE DEVICES UTILIZED TO PREVENT FALLS:  Life alert? No  Use of a cane, walker or w/c? No  Grab bars in the bathroom? No  Shower chair or bench in shower? No  Elevated toilet seat or a handicapped toilet? No   TIMED UP AND GO:  Was the test performed? No .  Length of time to ambulate 10 feet: no sec.   Appearance of gait: Patient not evaluated for gait during this visit.  Cognitive Function:      09/04/2016    8:03 AM  Montreal Cognitive Assessment   Visuospatial/ Executive (0/5) 5  Naming (0/3) 3  Attention: Read list of digits (0/2) 2  Attention: Read list of letters (0/1) 1  Attention: Serial 7 subtraction starting at 100 (0/3) 3  Language: Repeat phrase (0/2) 2  Language : Fluency (0/1) 1  Abstraction (0/2) 2  Delayed Recall (0/5) 3  Orientation (0/6) 6  Total 28  Adjusted Score (based on education) 28      05/04/2022   10:00 AM  6CIT Screen  What Year? 0 points  What month? 0 points  What time? 0 points  Count back from 20 0 points  Months in reverse 0 points  Repeat phrase 0 points  Total Score 0 points    Immunizations Immunization History  Administered Date(s) Administered   DT (Pediatric) 06/16/2013   Fluad Quad(high Dose 65+) 09/19/2019, 09/12/2021   Influenza Split  08/19/2011, 09/17/2013, 11/16/2014, 09/15/2015, 09/14/2016   Influenza Whole 09/26/2006, 09/17/2009, 10/06/2010, 09/16/2012   Influenza, High Dose Seasonal PF 10/15/2018   Influenza-Unspecified 09/26/2017, 08/30/2018   Moderna Sars-Covid-2 Vaccination 01/23/2020, 02/19/2020, 10/29/2020, 05/04/2021   Pneumococcal Conjugate-13 10/22/2018   Pneumococcal Polysaccharide-23 10/29/2003, 09/17/2009   Pneumococcal-Unspecified 05/22/2014, 07/06/2014   Td 07/20/2003   Tdap 06/19/2013   Zoster Recombinat (Shingrix) 09/17/2020, 11/19/2020   Zoster, Live 05/30/2011    TDAP status: Up to date  Flu Vaccine status: Up to date  Pneumococcal vaccine status: Up to date  Covid-19 vaccine status: Completed vaccines  Qualifies for Shingles Vaccine? Yes   Zostavax completed Yes   Shingrix Completed?: Yes  Screening Tests Health Maintenance  Topic Date Due   OPHTHALMOLOGY EXAM  Never done   Hepatitis C Screening  Never done   COVID-19 Vaccine (5 - Booster for Moderna series) 06/29/2021   HEMOGLOBIN A1C  02/16/2022   FOOT EXAM  05/02/2022   INFLUENZA VACCINE  07/18/2022   TETANUS/TDAP  06/20/2023   Pneumonia Vaccine 46+ Years old  Completed   DEXA SCAN  Completed   Zoster Vaccines- Shingrix  Completed   HPV VACCINES  Aged Out   COLONOSCOPY (Pts 45-58yr Insurance coverage  will need to be confirmed)  Discontinued    Health Maintenance  Health Maintenance Due  Topic Date Due   OPHTHALMOLOGY EXAM  Never done   Hepatitis C Screening  Never done   COVID-19 Vaccine (5 - Booster for Moderna series) 06/29/2021   HEMOGLOBIN A1C  02/16/2022   FOOT EXAM  05/02/2022    Colorectal cancer screening: No longer required.   Mammogram status: Completed 08/17/2021. Repeat every year  Bone Density status: Completed 04/15/2018. Results reflect: Bone density results: OSTEOPENIA. Repeat every 2-3 years.  Lung Cancer Screening: (Low Dose CT Chest recommended if Age 20-80 years, 30 pack-year currently smoking  OR have quit w/in 15years.) does not qualify.   Lung Cancer Screening Referral: no  Additional Screening:  Hepatitis C Screening: does qualify; Completed: no  Vision Screening: Recommended annual ophthalmology exams for early detection of glaucoma and other disorders of the eye. Is the patient up to date with their annual eye exam?  Yes  Who is the provider or what is the name of the office in which the patient attends annual eye exams? Luberta Mutter, MD and Chrystie Nose. Zigmund Daniel, MD (Retina Specialist) If pt is not established with a provider, would they like to be referred to a provider to establish care? No .   Dental Screening: Recommended annual dental exams for proper oral hygiene  Community Resource Referral / Chronic Care Management: CRR required this visit?  No   CCM required this visit?  No      Plan:     I have personally reviewed and noted the following in the patient's chart:   Medical and social history Use of alcohol, tobacco or illicit drugs  Current medications and supplements including opioid prescriptions.  Functional ability and status Nutritional status Physical activity Advanced directives List of other physicians Hospitalizations, surgeries, and ER visits in previous 12 months Vitals Screenings to include cognitive, depression, and falls Referrals and appointments  In addition, I have reviewed and discussed with patient certain preventive protocols, quality metrics, and best practice recommendations. A written personalized care plan for preventive services as well as general preventive health recommendations were provided to patient.     Sheral Flow, LPN   12/23/2692   Nurse Notes:  There were no vitals filed for this visit. There is no height or weight on file to calculate BMI. Patient stated that she has no issues with gait or balance; does not use any assistive devices. Medications reviewed with patient; no opioid use noted.

## 2022-05-08 ENCOUNTER — Encounter: Payer: Self-pay | Admitting: *Deleted

## 2022-05-09 ENCOUNTER — Encounter: Payer: Self-pay | Admitting: Adult Health

## 2022-05-09 ENCOUNTER — Ambulatory Visit (INDEPENDENT_AMBULATORY_CARE_PROVIDER_SITE_OTHER): Payer: Medicare Other | Admitting: Adult Health

## 2022-05-09 VITALS — BP 141/68 | HR 60 | Ht 61.0 in | Wt 150.0 lb

## 2022-05-09 DIAGNOSIS — G4733 Obstructive sleep apnea (adult) (pediatric): Secondary | ICD-10-CM

## 2022-05-09 DIAGNOSIS — Z9989 Dependence on other enabling machines and devices: Secondary | ICD-10-CM | POA: Diagnosis not present

## 2022-05-09 NOTE — Progress Notes (Signed)
PATIENT: Amber Roman DOB: 01-Feb-1946  REASON FOR VISIT: follow up HISTORY FROM: patient  Chief Complaint  Patient presents with   Follow-up    Pt in 20  pt is here for CPAP follow up . Pt states she has no questions or concerns for this visit      HISTORY OF PRESENT ILLNESS: Today 05/09/22:  Amber Roman is a 76 year old female with a history of OSA On CPAP. She returns today for follow-up. Denies any issues. DL is below.   05/09/21: Amber Roman is a 76 year old female with a history of obstructive sleep apnea on CPAP.  She returns today for follow-up.  Overall she feels that the CPAP works well for her.  She denies any new issues.  Her download is below.  Neuropathy: Patient reports that she does not have any pain in the hands or legs.  Denies any numbness.  She states on the right foot she feels as if she has a "duck" foot.  She states that it feels as if her first 3 toes are connected together.  Other than that she does not have any other symptoms.  No change in her gait or balance.  She states that the symptoms have not progressed over the last 5 years.  She is on Cymbalta prescribed by Amber Roman     REVIEW OF SYSTEMS: Out of a complete 14 system review of symptoms, the patient complains only of the following symptoms, and all other reviewed systems are negative.   ESS 4  ALLERGIES: Allergies  Allergen Reactions   Sulfonamide Derivatives Other (See Comments)    Childhood allergy     HOME MEDICATIONS: Outpatient Medications Prior to Visit  Medication Sig Dispense Refill   albuterol (PROAIR HFA) 108 (90 Base) MCG/ACT inhaler INHALE 2 PUFFS INTO THE LUNGS EVERY 6 HOURS AS NEEDED FOR WHEEZING OR SHORTNESS OF BREATH 18 g 12   azelastine (OPTIVAR) 0.05 % ophthalmic solution Place 1 drop into both eyes 2 (two) times daily. 6 mL 3   b complex vitamins capsule Take 1 capsule by mouth daily.     budesonide-formoterol (SYMBICORT) 160-4.5 MCG/ACT inhaler Inhale 2 puffs into the  lungs 2 (two) times daily. Via [place] pt assistance 3 each 3   buPROPion (WELLBUTRIN XL) 150 MG 24 hr tablet Take 150 mg by mouth every morning.     diphenoxylate-atropine (LOMOTIL) 2.5-0.025 MG tablet Take 1 tablet by mouth 4 (four) times daily as needed. For stomach 30 tablet 5   DULoxetine (CYMBALTA) 60 MG capsule Take 1 capsule by mouth daily.  1   fluticasone (FLONASE) 50 MCG/ACT nasal spray 1-2 puffs each nostril once daily while needed 16 g prn   gabapentin (NEURONTIN) 300 MG capsule Take 1 capsule (300 mg total) by mouth 2 (two) times daily. 180 capsule 0   guaiFENesin (MUCINEX) 600 MG 12 hr tablet Take 1,200 mg by mouth 2 (two) times daily as needed. For chest tightness     ipratropium-albuterol (DUONEB) 0.5-2.5 (3) MG/3ML SOLN Take 3 mLs by nebulization every 6 (six) hours as needed. 75 mL 12   irbesartan-hydrochlorothiazide (AVALIDE) 300-12.5 MG tablet TAKE 1 TABLET BY MOUTH DAILY 90 tablet 3   meloxicam (MOBIC) 15 MG tablet TAKE 1 TABLET(15 MG) BY MOUTH DAILY 90 tablet 0   metFORMIN (GLUCOPHAGE) 500 MG tablet Take 500 mg by mouth daily.     metoprolol succinate (TOPROL-XL) 100 MG 24 hr tablet TAKE 1 TABLET(100 MG) BY MOUTH DAILY 90  tablet 1   Multiple Vitamin (MULTIVITAMIN) tablet Take 1 tablet by mouth daily.     Multiple Vitamins-Minerals (PRESERVISION AREDS PO) Take by mouth daily.     Multiple Vitamins-Minerals (VITAMIN D3 COMPLETE PO) Take 50 mcg by mouth daily.     Nebulizers (COMPRESSOR NEBULIZER) MISC As directed 1 each 0   rosuvastatin (CRESTOR) 20 MG tablet TAKE 1 TABLET(20 MG) BY MOUTH DAILY 90 tablet 3   traZODone (DESYREL) 50 MG tablet TAKE 1 TABLET(50 MG) BY MOUTH AT BEDTIME AS NEEDED FOR SLEEP 30 tablet 5   No facility-administered medications prior to visit.    PAST MEDICAL HISTORY: Past Medical History:  Diagnosis Date   Allergic conjunctivitis    Arthritis    Basal cell carcinoma 10/29/1997   ledt nasal ala ([facility])   Bronchitis    Bruxism, sleep-related  04/13/2014   Depression    Esophageal reflux    Family history of heart disease    H/O abdominal hysterectomy    w/o recurrence   Hyperlipidemia    Hypertension    Insomnia 04/13/2014   Obstructive sleep apnea    improved on C Pap   Squamous cell carcinoma of skin 09/20/2015   left upper arm ([facility])   Uterine cancer ([place])    hysterectomy    PAST SURGICAL HISTORY: Past Surgical History:  Procedure Laterality Date   BILATERAL SALPINGOOPHORECTOMY     CARPAL TUNNEL RELEASE     bilateral   DOPPLER ECHOCARDIOGRAPHY     NASAL SEPTUM SURGERY     NM MYOVIEW LTD     ROTATOR CUFF REPAIR Right    TOTAL ABDOMINAL HYSTERECTOMY     TOTAL HIP ARTHROPLASTY  01/10/2013   Procedure: TOTAL HIP ARTHROPLASTY ANTERIOR APPROACH;  Surgeon: Mcarthur Rossetti, MD;  Location: [facility];  Service: Orthopedics;  Laterality: Right;    FAMILY HISTORY: Family History  Problem Relation Age of Onset   Lung disease Mother    Heart attack Father    High blood pressure Father    Multiple sclerosis Father    Diabetes Mellitus II Sister    High blood pressure Sister    Colon cancer Brother    High blood pressure Brother    Sleep apnea Neg Hx     SOCIAL HISTORY: Social History   Socioeconomic History   Marital status: Single    Spouse name: Not on file   Number of children: 1   Years of education: College   Highest education level: Not on file  Occupational History   Not on file  Tobacco Use   Smoking status: Former    Packs/day: 2.00    Years: 15.00    Pack years: 30.00    Types: Cigarettes    Quit date: 12/18/1978    Years since quitting: 43.4   Smokeless tobacco: Never  Vaping Use   Vaping Use: Never used  Substance and Sexual Activity   Alcohol use: No   Drug use: No    Comment: hx of marijuana use    Sexual activity: Not on file  Other Topics Concern   Not on file  Social History Narrative   Patient is single.   Patient has a college education.   Patient drinks one caffeine  drink per day.   Patient is right-handed.   Patient has one child.         Social Determinants of Health   Financial Resource Strain: Low Risk    Difficulty of Paying Living Expenses: Not hard  at all  Food Insecurity: No Food Insecurity   Worried About Charity fundraiser in the Last Year: Never true   Ran Out of Food in the Last Year: Never true  Transportation Needs: No Transportation Needs   Lack of Transportation (Medical): No   Lack of Transportation (Non-Medical): No  Physical Activity: Sufficiently Active   Days of Exercise per Week: 5 days   Minutes of Exercise per Session: 30 min  Stress: No Stress Concern Present   Feeling of Stress : Not at all  Social Connections: Moderately Integrated   Frequency of Communication with Friends and Family: More than three times a week   Frequency of Social Gatherings with Friends and Family: More than three times a week   Attends Religious Services: More than 4 times per year   Active Member of [employer] or Organizations: Yes   Attends Music therapist: More than 4 times per year   Marital Status: Never married  Intimate Partner Violence: Not At Risk   Fear of Current or Ex-Partner: No   Emotionally Abused: No   Physically Abused: No   Sexually Abused: No      PHYSICAL EXAM  Vitals:   05/09/22 0904  BP: (!) 141/68  Pulse: 60  Weight: 150 lb (68 kg)  Height: '5\' 1"'$  (1.549 m)   Body mass index is 28.34 kg/m.  Generalized: Well developed, in no acute distress  Chest: Lungs clear to auscultation bilaterally  Neurological examination  Mentation: Alert oriented to time, place, history taking. Follows all commands speech and language fluent Cranial nerve II-XII: Extraocular movements were full, visual field were full on confrontational test Head turning and shoulder shrug  were normal and symmetric. Motor: The motor testing reveals 5 over 5 strength of all 4 extremities. Good symmetric motor tone is noted throughout.   Sensory: Sensory testing is intact to soft touch on all 4 extremities.  Pinprick and vibration sensation intact in all 4 extremities no evidence of extinction is noted.  Gait and station: Gait is normal.    DIAGNOSTIC DATA (LABS, IMAGING, TESTING) - I reviewed patient records, labs, notes, testing and imaging myself where available.  Lab Results  Component Value Date   WBC 8.9 05/02/2021   HGB 13.1 05/02/2021   HCT 39.3 05/02/2021   MCV 83.0 05/02/2021   PLT 256.0 05/02/2021      Component Value Date/Time   NA 142 05/02/2021 1324   NA 141 09/19/2017 0000   K 5.1 05/02/2021 1324   CL 101 05/02/2021 1324   CO2 32 05/02/2021 1324   GLUCOSE 141 (H) 05/02/2021 1324   BUN 19 05/02/2021 1324   BUN 22 (A) 09/19/2017 0000   CREATININE 0.87 05/02/2021 1324   CALCIUM 9.9 05/02/2021 1324   PROT 7.1 05/02/2021 1324   PROT 7.1 07/22/2019 1158   ALBUMIN 4.4 05/02/2021 1324   ALBUMIN 5.0 (H) 07/22/2019 1158   AST 20 05/02/2021 1324   ALT 32 05/02/2021 1324   ALKPHOS 84 05/02/2021 1324   BILITOT 0.5 05/02/2021 1324   BILITOT 0.3 07/22/2019 1158   GFRNONAA >90 01/11/2013 0444   GFRAA >90 01/11/2013 0444   Lab Results  Component Value Date   CHOL 154 05/02/2021   HDL 46.60 05/02/2021   LDLCALC 72 04/28/2020   LDLDIRECT 84.0 05/02/2021   TRIG 300.0 (H) 05/02/2021   CHOLHDL 3 05/02/2021   Lab Results  Component Value Date   HGBA1C 6.6 (A) 08/19/2021   No results found  for: VITAMINB12 Lab Results  Component Value Date   TSH 3.59 09/19/2017      ASSESSMENT AND PLAN 76 y.o. year old female  has a past medical history of Allergic conjunctivitis, Arthritis, Basal cell carcinoma (10/29/1997), Bronchitis, Bruxism, sleep-related (04/13/2014), Depression, Esophageal reflux, Family history of heart disease, H/O abdominal hysterectomy, Hyperlipidemia, Hypertension, Insomnia (04/13/2014), Obstructive sleep apnea, Squamous cell carcinoma of skin (09/20/2015), and Uterine cancer ([place]). here  with:  OSA on CPAP  - CPAP compliance excellent - Good treatment of AHI  - Encourage patient to use CPAP nightly and > 4 hours each night - F/U in 1 year or sooner if needed     Ward Givens, MSN, NP-C 05/09/2022, 9:11 AM [facility] [address] [place], [place] [address] [phone]

## 2022-05-10 ENCOUNTER — Ambulatory Visit (INDEPENDENT_AMBULATORY_CARE_PROVIDER_SITE_OTHER): Payer: Medicare Other | Admitting: Physician Assistant

## 2022-05-10 ENCOUNTER — Encounter: Payer: Self-pay | Admitting: Cardiovascular Disease

## 2022-05-10 ENCOUNTER — Encounter: Payer: Self-pay | Admitting: Physician Assistant

## 2022-05-10 ENCOUNTER — Ambulatory Visit: Payer: Medicare Other | Admitting: Internal Medicine

## 2022-05-10 ENCOUNTER — Ambulatory Visit (INDEPENDENT_AMBULATORY_CARE_PROVIDER_SITE_OTHER): Payer: Medicare Other | Admitting: Cardiovascular Disease

## 2022-05-10 VITALS — BP 140/68 | HR 62 | Ht 61.0 in | Wt 151.6 lb

## 2022-05-10 DIAGNOSIS — Z1283 Encounter for screening for malignant neoplasm of skin: Secondary | ICD-10-CM

## 2022-05-10 DIAGNOSIS — E782 Mixed hyperlipidemia: Secondary | ICD-10-CM

## 2022-05-10 DIAGNOSIS — L570 Actinic keratosis: Secondary | ICD-10-CM | POA: Diagnosis not present

## 2022-05-10 DIAGNOSIS — E785 Hyperlipidemia, unspecified: Secondary | ICD-10-CM

## 2022-05-10 DIAGNOSIS — Z85828 Personal history of other malignant neoplasm of skin: Secondary | ICD-10-CM

## 2022-05-10 DIAGNOSIS — I10 Essential (primary) hypertension: Secondary | ICD-10-CM

## 2022-05-10 DIAGNOSIS — E1169 Type 2 diabetes mellitus with other specified complication: Secondary | ICD-10-CM

## 2022-05-10 NOTE — Patient Instructions (Signed)

## 2022-05-10 NOTE — Assessment & Plan Note (Signed)
History of essential hypertension a blood pressure measured today at 140/68.  She is on Avalide and metoprolol.

## 2022-05-10 NOTE — Progress Notes (Signed)
05/10/2022 Amber Roman   1946/10/09  [id number]  Primary Physician Hoyt Koch, MD Primary Cardiologist: Lorretta Harp MD Lupe Carney, [place]  HPI:  Amber Roman is a 76 y.o.  fit-appearing, divorced Caucasian female, mother of a 70 year old [place] adopted daughter named [name] who she adopted when she was 76 years old and has graduated from [employer]. She currently lives in [place] and owns an indoor plant shop... I last saw her in the office  07/22/2019. She has a history of hypertension, hyperlipidemia, and a strong family history for heart disease with a father who had his first MI at age 60 and died 10 years later of a massive myocardial infarction. Her last Myoview stress test performed 10/25/12 was not ischemic.She has never had a heart attack or stroke, otherwise is asymptomatic. Her other problems include uterine cancer having undergone a total abdominal hysterectomy without recurrence 12 years ago. Dr. Dagmar Hait follows her lipid profile. She had an uncomplicated total hip replacement by Dr. Zollie Beckers 01/09/2013. Since I saw her back she denies chest pain but does complain of occasional shortness of breath on exertion. She had negative breathing studies by Dr. Annamaria Boots recently.  She was diagnosed obstructive sleep apnea by Dr. Fernande Bras and has been a remarkable responder to CPAP.   Since I saw her in the office 1 year ago she has remained stable.   She was also diagnosed with type 2 diabetes and was placed on metformin.  She denies chest pain or shortness of breath.   Current Meds  Medication Sig   albuterol (PROAIR HFA) 108 (90 Base) MCG/ACT inhaler INHALE 2 PUFFS INTO THE LUNGS EVERY 6 HOURS AS NEEDED FOR WHEEZING OR SHORTNESS OF BREATH   azelastine (OPTIVAR) 0.05 % ophthalmic solution Place 1 drop into both eyes 2 (two) times daily.   b complex vitamins capsule Take 1 capsule by mouth daily.   BREO ELLIPTA 100-25 MCG/ACT AEPB    budesonide-formoterol (SYMBICORT)  160-4.5 MCG/ACT inhaler Inhale 2 puffs into the lungs 2 (two) times daily. Via [employer]&ME pt assistance   buPROPion (WELLBUTRIN XL) 150 MG 24 hr tablet Take 150 mg by mouth every morning.   diphenoxylate-atropine (LOMOTIL) 2.5-0.025 MG tablet Take 1 tablet by mouth 4 (four) times daily as needed. For stomach   DULoxetine (CYMBALTA) 60 MG capsule Take 1 capsule by mouth daily.   fluticasone (FLONASE) 50 MCG/ACT nasal spray 1-2 puffs each nostril once daily while needed   gabapentin (NEURONTIN) 300 MG capsule Take 1 capsule (300 mg total) by mouth 2 (two) times daily.   guaiFENesin (MUCINEX) 600 MG 12 hr tablet Take 1,200 mg by mouth 2 (two) times daily as needed. For chest tightness   ipratropium-albuterol (DUONEB) 0.5-2.5 (3) MG/3ML SOLN Take 3 mLs by nebulization every 6 (six) hours as needed.   irbesartan-hydrochlorothiazide (AVALIDE) 300-12.5 MG tablet TAKE 1 TABLET BY MOUTH DAILY   meloxicam (MOBIC) 15 MG tablet TAKE 1 TABLET(15 MG) BY MOUTH DAILY   metFORMIN (GLUCOPHAGE) 500 MG tablet Take 500 mg by mouth daily.   metoprolol succinate (TOPROL-XL) 100 MG 24 hr tablet TAKE 1 TABLET(100 MG) BY MOUTH DAILY   Multiple Vitamin (MULTIVITAMIN) tablet Take 1 tablet by mouth daily.   Multiple Vitamins-Minerals (PRESERVISION AREDS PO) Take by mouth daily.   Multiple Vitamins-Minerals (VITAMIN D3 COMPLETE PO) Take 50 mcg by mouth daily.   Nebulizers (COMPRESSOR NEBULIZER) MISC As directed   rosuvastatin (CRESTOR) 20 MG tablet TAKE 1 TABLET(20 MG) BY  MOUTH DAILY   traZODone (DESYREL) 50 MG tablet TAKE 1 TABLET(50 MG) BY MOUTH AT BEDTIME AS NEEDED FOR SLEEP     Allergies  Allergen Reactions   Sulfonamide Derivatives Other (See Comments)    Childhood allergy     Social History   Socioeconomic History   Marital status: Single    Spouse name: Not on file   Number of children: 1   Years of education: College   Highest education level: Not on file  Occupational History   Not on file  Tobacco Use    Smoking status: Former    Packs/day: 2.00    Years: 15.00    Pack years: 30.00    Types: Cigarettes    Quit date: 12/18/1978    Years since quitting: 43.4   Smokeless tobacco: Never  Vaping Use   Vaping Use: Never used  Substance and Sexual Activity   Alcohol use: No   Drug use: No    Comment: hx of marijuana use    Sexual activity: Not on file  Other Topics Concern   Not on file  Social History Narrative   Patient is single.   Patient has a college education.   Patient drinks one caffeine drink per day.   Patient is right-handed.   Patient has one child.         Social Determinants of Health   Financial Resource Strain: Low Risk    Difficulty of Paying Living Expenses: Not hard at all  Food Insecurity: No Food Insecurity   Worried About Charity fundraiser in the Last Year: Never true   [place] in the Last Year: Never true  Transportation Needs: No Transportation Needs   Lack of Transportation (Medical): No   Lack of Transportation (Non-Medical): No  Physical Activity: Sufficiently Active   Days of Exercise per Week: 5 days   Minutes of Exercise per Session: 30 min  Stress: No Stress Concern Present   Feeling of Stress : Not at all  Social Connections: Moderately Integrated   Frequency of Communication with Friends and Family: More than three times a week   Frequency of Social Gatherings with Friends and Family: More than three times a week   Attends Religious Services: More than 4 times per year   Active Member of [employer] or Organizations: Yes   Attends Music therapist: More than 4 times per year   Marital Status: Never married  Human resources officer Violence: Not At Risk   Fear of Current or Ex-Partner: No   Emotionally Abused: No   Physically Abused: No   Sexually Abused: No     Review of Systems: General: negative for chills, fever, night sweats or weight changes.  Cardiovascular: negative for chest pain, dyspnea on exertion, edema,  orthopnea, palpitations, paroxysmal nocturnal dyspnea or shortness of breath Dermatological: negative for rash Respiratory: negative for cough or wheezing Urologic: negative for hematuria Abdominal: negative for nausea, vomiting, diarrhea, bright red blood per rectum, melena, or hematemesis Neurologic: negative for visual changes, syncope, or dizziness All other systems reviewed and are otherwise negative except as noted above.    Blood pressure 140/68, pulse 62, height '5\' 1"'$  (1.549 m), weight 151 lb 9.6 oz (68.8 kg), SpO2 98 %.  General appearance: alert and no distress Neck: no adenopathy, no carotid bruit, no JVD, supple, symmetrical, trachea midline, and thyroid not enlarged, symmetric, no tenderness/mass/nodules Lungs: clear to auscultation bilaterally Heart: regular rate and rhythm, S1, S2 normal, no murmur,  click, rub or gallop Extremities: extremities normal, atraumatic, no cyanosis or edema Pulses: 2+ and symmetric Skin: Skin color, texture, turgor normal. No rashes or lesions Neurologic: Grossly normal  EKG sinus rhythm at 62 without ST or T wave changes.  Personally reviewed this EKG.  ASSESSMENT AND PLAN:   Hyperlipidemia associated with type 2 diabetes mellitus ([place]) History of hyperlipidemia on statin therapy with lipid profile performed 04/28/2020 revealing total cholesterol 154, LDL 72 and HDL 46.  Essential hypertension History of essential hypertension a blood pressure measured today at 140/68.  She is on Avalide and metoprolol.     Lorretta Harp MD FACP,FACC,FAHA, [facility] 05/10/2022 2:23 PM

## 2022-05-10 NOTE — Progress Notes (Signed)
   Follow-Up Visit   Subjective  Amber Roman is a 76 y.o. female who presents for the following: Annual Exam (Here for annual skin exam. No concerns. History of non mole skin cancer. ).   The following portions of the chart were reviewed this encounter and updated as appropriate:  Tobacco  Allergies  Meds  Problems  Med Hx  Surg Hx  Fam Hx      Objective  Well appearing patient in no apparent distress; mood and affect are within normal limits.  All skin waist up examined.  No atypical nevi or signs of NMSC noted at the time of the visit.   Left Outer Eye Erythematous patches with gritty scale.   Assessment & Plan  Encounter for screening for malignant neoplasm of skin  Yearly skin examinations  AK (actinic keratosis) Left Outer Eye  Destruction of lesion - Left Outer Eye Complexity: simple   Destruction method: cryotherapy   Informed consent: discussed and consent obtained   Timeout:  patient name, date of birth, surgical site, and procedure verified Lesion destroyed using liquid nitrogen: Yes   Cryotherapy cycles:  3 Outcome: patient tolerated procedure well with no complications      I, Sandrine Bloodsworth, PA-C, have reviewed all documentation's for this visit.  The documentation on 05/10/22 for the exam, diagnosis, procedures and orders are all accurate and complete.

## 2022-05-10 NOTE — Assessment & Plan Note (Signed)
History of hyperlipidemia on statin therapy with lipid profile performed 04/28/2020 revealing total cholesterol 154, LDL 72 and HDL 46.

## 2022-05-16 ENCOUNTER — Ambulatory Visit (INDEPENDENT_AMBULATORY_CARE_PROVIDER_SITE_OTHER): Payer: Medicare Other | Admitting: Internal Medicine

## 2022-05-16 ENCOUNTER — Encounter: Payer: Self-pay | Admitting: Internal Medicine

## 2022-05-16 VITALS — BP 128/76 | HR 60 | Resp 18 | Ht 61.0 in | Wt 149.4 lb

## 2022-05-16 DIAGNOSIS — I10 Essential (primary) hypertension: Secondary | ICD-10-CM | POA: Diagnosis not present

## 2022-05-16 DIAGNOSIS — I700 Atherosclerosis of aorta: Secondary | ICD-10-CM | POA: Diagnosis not present

## 2022-05-16 DIAGNOSIS — E1169 Type 2 diabetes mellitus with other specified complication: Secondary | ICD-10-CM

## 2022-05-16 DIAGNOSIS — Z9989 Dependence on other enabling machines and devices: Secondary | ICD-10-CM | POA: Diagnosis not present

## 2022-05-16 DIAGNOSIS — G4733 Obstructive sleep apnea (adult) (pediatric): Secondary | ICD-10-CM | POA: Diagnosis not present

## 2022-05-16 DIAGNOSIS — E559 Vitamin D deficiency, unspecified: Secondary | ICD-10-CM

## 2022-05-16 DIAGNOSIS — E785 Hyperlipidemia, unspecified: Secondary | ICD-10-CM | POA: Diagnosis not present

## 2022-05-16 LAB — LIPID PANEL
Cholesterol: 140 mg/dL (ref 0–200)
HDL: 42.2 mg/dL (ref >39.00)
LDL Cholesterol: 60 mg/dL (ref 0–99)
NonHDL: 98.26
Total CHOL/HDL Ratio: 3
Triglycerides: 193 mg/dL — ABNORMAL HIGH (ref 0.0–149.0)
VLDL: 38.6 mg/dL (ref 0.0–40.0)

## 2022-05-16 LAB — COMPREHENSIVE METABOLIC PANEL
ALT: 16 U/L (ref 0–35)
AST: 15 U/L (ref 0–37)
Albumin: 4.7 g/dL (ref 3.5–5.2)
Alkaline Phosphatase: 79 U/L (ref 39–117)
BUN: 18 mg/dL (ref 6–23)
CO2: 29 mEq/L (ref 19–32)
Calcium: 9.9 mg/dL (ref 8.4–10.5)
Chloride: 103 mEq/L (ref 96–112)
Creatinine, Ser: 1.04 mg/dL (ref 0.40–1.20)
GFR: 51.76 mL/min — ABNORMAL LOW (ref >60.00)
Glucose, Bld: 118 mg/dL — ABNORMAL HIGH (ref 70–99)
Potassium: 4.5 mEq/L (ref 3.5–5.1)
Sodium: 141 mEq/L (ref 135–145)
Total Bilirubin: 0.7 mg/dL (ref 0.2–1.2)
Total Protein: 7 g/dL (ref 6.0–8.3)

## 2022-05-16 LAB — CBC
HCT: 38.6 % (ref 36.0–46.0)
Hemoglobin: 12.8 g/dL (ref 12.0–15.0)
MCHC: 33.2 g/dL (ref 30.0–36.0)
MCV: 82.2 fl (ref 78.0–100.0)
Platelets: 229 10*3/uL (ref 150.0–400.0)
RBC: 4.69 Mil/uL (ref 3.87–5.11)
RDW: 16.3 % — ABNORMAL HIGH (ref 11.5–15.5)
WBC: 8.3 10*3/uL (ref 4.0–10.5)

## 2022-05-16 LAB — TSH: TSH: 3.47 u[IU]/mL (ref 0.35–5.50)

## 2022-05-16 LAB — VITAMIN D 25 HYDROXY (VIT D DEFICIENCY, FRACTURES): VITD: 44.17 ng/mL (ref 30.00–100.00)

## 2022-05-16 LAB — VITAMIN B12: Vitamin B-12: 1444 pg/mL — ABNORMAL HIGH (ref 211–911)

## 2022-05-16 LAB — HEMOGLOBIN A1C: Hgb A1c MFr Bld: 6.7 % — ABNORMAL HIGH (ref 4.6–6.5)

## 2022-05-16 MED ORDER — METFORMIN HCL 500 MG PO TABS: 500.0000 mg | ORAL_TABLET | Freq: Every day | ORAL | 3 refills | Status: DC

## 2022-05-16 MED ORDER — MELOXICAM 15 MG PO TABS: ORAL_TABLET | ORAL | 3 refills | Status: DC

## 2022-05-16 MED ORDER — IRBESARTAN-HYDROCHLOROTHIAZIDE 300-12.5 MG PO TABS: 1.0000 | ORAL_TABLET | Freq: Every day | ORAL | 3 refills | Status: DC

## 2022-05-16 MED ORDER — ROSUVASTATIN CALCIUM 20 MG PO TABS: ORAL_TABLET | ORAL | 3 refills | Status: DC

## 2022-05-16 NOTE — Assessment & Plan Note (Signed)
Will continue lifelong.

## 2022-05-16 NOTE — Assessment & Plan Note (Signed)
Taking crestor 20 mg daily and will continue. Checking lipid panel today.

## 2022-05-16 NOTE — Assessment & Plan Note (Signed)
Checking lipid panel and adjust as needed crestor 20 mg daily. LDL goal <100.

## 2022-05-16 NOTE — Assessment & Plan Note (Signed)
BP at goal on irbesartan/hctz 300/12.5 mg daily and metoprolol-xl 100 mg daily. Checking CMP and adjust as needed.

## 2022-05-16 NOTE — Assessment & Plan Note (Signed)
Foot exam done, checking labs including microalbumin to creatinine ratio, HgA1c, CMP, lipid panel. Taking metformin 500 mg daily and previously at goal. Taking statin. Is on ARB. Adjust as needed.

## 2022-05-16 NOTE — Patient Instructions (Signed)
We will check the labs today and call you back about the results.    

## 2022-05-16 NOTE — Progress Notes (Signed)
   Subjective:   Patient ID: Amber Roman, female    DOB: Jun 29, 1946, 76 y.o.   MRN: [id number]  HPI The patient is a 76 YO female coming in for follow up.   Review of Systems  Constitutional: Negative.   HENT: Negative.    Eyes: Negative.   Respiratory:  Negative for cough, chest tightness and shortness of breath.   Cardiovascular:  Negative for chest pain, palpitations and leg swelling.  Gastrointestinal:  Negative for abdominal distention, abdominal pain, constipation, diarrhea, nausea and vomiting.  Musculoskeletal: Negative.   Skin: Negative.   Neurological: Negative.   Psychiatric/Behavioral: Negative.     Objective:  Physical Exam Constitutional:      Appearance: She is well-developed.  HENT:     Head: Normocephalic and atraumatic.  Cardiovascular:     Rate and Rhythm: Normal rate and regular rhythm.  Pulmonary:     Effort: Pulmonary effort is normal. No respiratory distress.     Breath sounds: Normal breath sounds. No wheezing or rales.  Abdominal:     General: Bowel sounds are normal. There is no distension.     Palpations: Abdomen is soft.     Tenderness: There is no abdominal tenderness. There is no rebound.  Musculoskeletal:     Cervical back: Normal range of motion.  Skin:    General: Skin is warm and dry.     Comments: Foot exam done  Neurological:     Mental Status: She is alert and oriented to person, place, and time.     Coordination: Coordination normal.    Vitals:   05/16/22 0903  BP: 128/76  Pulse: 60  Resp: 18  SpO2: 98%  Weight: 149 lb 6.4 oz (67.8 kg)  Height: '5\' 1"'$  (1.549 m)    Assessment & Plan:

## 2022-05-17 ENCOUNTER — Encounter: Payer: Self-pay | Admitting: Internal Medicine

## 2022-05-17 LAB — MICROALBUMIN / CREATININE URINE RATIO
Creatinine,U: 72.6 mg/dL
Microalb Creat Ratio: 1.5 mg/g (ref 0.0–30.0)
Microalb, Ur: 1.1 mg/dL (ref 0.0–1.9)

## 2022-05-29 ENCOUNTER — Encounter (INDEPENDENT_AMBULATORY_CARE_PROVIDER_SITE_OTHER): Payer: Medicare Other | Admitting: Ophthalmology

## 2022-05-29 DIAGNOSIS — H35033 Hypertensive retinopathy, bilateral: Secondary | ICD-10-CM

## 2022-05-29 DIAGNOSIS — I10 Essential (primary) hypertension: Secondary | ICD-10-CM

## 2022-05-29 DIAGNOSIS — H43813 Vitreous degeneration, bilateral: Secondary | ICD-10-CM | POA: Diagnosis not present

## 2022-05-29 DIAGNOSIS — H353231 Exudative age-related macular degeneration, bilateral, with active choroidal neovascularization: Secondary | ICD-10-CM | POA: Diagnosis not present

## 2022-07-03 ENCOUNTER — Encounter (INDEPENDENT_AMBULATORY_CARE_PROVIDER_SITE_OTHER): Payer: Medicare Other | Admitting: Ophthalmology

## 2022-07-03 DIAGNOSIS — H353231 Exudative age-related macular degeneration, bilateral, with active choroidal neovascularization: Secondary | ICD-10-CM

## 2022-07-03 DIAGNOSIS — I10 Essential (primary) hypertension: Secondary | ICD-10-CM

## 2022-07-03 DIAGNOSIS — H35033 Hypertensive retinopathy, bilateral: Secondary | ICD-10-CM | POA: Diagnosis not present

## 2022-07-03 DIAGNOSIS — H43813 Vitreous degeneration, bilateral: Secondary | ICD-10-CM

## 2022-07-04 ENCOUNTER — Other Ambulatory Visit: Payer: Self-pay | Admitting: Internal Medicine

## 2022-08-05 ENCOUNTER — Other Ambulatory Visit: Payer: Self-pay | Admitting: Internal Medicine

## 2022-08-07 ENCOUNTER — Encounter (INDEPENDENT_AMBULATORY_CARE_PROVIDER_SITE_OTHER): Payer: Medicare Other | Admitting: Ophthalmology

## 2022-08-08 ENCOUNTER — Ambulatory Visit (INDEPENDENT_AMBULATORY_CARE_PROVIDER_SITE_OTHER): Payer: Medicare Other | Admitting: Internal Medicine

## 2022-08-08 ENCOUNTER — Encounter: Payer: Self-pay | Admitting: Internal Medicine

## 2022-08-08 ENCOUNTER — Encounter (INDEPENDENT_AMBULATORY_CARE_PROVIDER_SITE_OTHER): Payer: Medicare Other | Admitting: Ophthalmology

## 2022-08-08 ENCOUNTER — Ambulatory Visit: Payer: Medicare Other | Admitting: Podiatry

## 2022-08-08 VITALS — BP 138/70 | HR 65 | Temp 99.0°F | Ht 61.0 in | Wt 144.0 lb

## 2022-08-08 DIAGNOSIS — H35033 Hypertensive retinopathy, bilateral: Secondary | ICD-10-CM | POA: Diagnosis not present

## 2022-08-08 DIAGNOSIS — E1169 Type 2 diabetes mellitus with other specified complication: Secondary | ICD-10-CM

## 2022-08-08 DIAGNOSIS — H43813 Vitreous degeneration, bilateral: Secondary | ICD-10-CM | POA: Diagnosis not present

## 2022-08-08 DIAGNOSIS — H353231 Exudative age-related macular degeneration, bilateral, with active choroidal neovascularization: Secondary | ICD-10-CM | POA: Diagnosis not present

## 2022-08-08 DIAGNOSIS — I10 Essential (primary) hypertension: Secondary | ICD-10-CM | POA: Diagnosis not present

## 2022-08-08 LAB — POCT GLYCOSYLATED HEMOGLOBIN (HGB A1C): Hemoglobin A1C: 6.2 % — AB (ref 4.0–5.6)

## 2022-08-08 NOTE — Assessment & Plan Note (Signed)
POC HgA1c done improved to 6.2. Will maintain metformin 500 mg daily for now. Recheck 3-6 months. If continued improvement will consider stopping metformin.

## 2022-08-08 NOTE — Patient Instructions (Signed)
The HgA1c is 6.2 so that is great

## 2022-08-08 NOTE — Progress Notes (Signed)
   Subjective:   Patient ID: Amber Roman, female    DOB: 1946-02-19, 76 y.o.   MRN: [id number]  HPI The patient is a 76 YO female coming in for follow up sugars.  Review of Systems  Constitutional: Negative.   HENT: Negative.    Eyes: Negative.   Respiratory:  Negative for cough, chest tightness and shortness of breath.   Cardiovascular:  Negative for chest pain, palpitations and leg swelling.  Gastrointestinal:  Negative for abdominal distention, abdominal pain, constipation, diarrhea, nausea and vomiting.  Musculoskeletal: Negative.   Skin: Negative.   Neurological: Negative.   Psychiatric/Behavioral: Negative.      Objective:  Physical Exam Constitutional:      Appearance: She is well-developed.  HENT:     Head: Normocephalic and atraumatic.  Cardiovascular:     Rate and Rhythm: Normal rate and regular rhythm.  Pulmonary:     Effort: Pulmonary effort is normal. No respiratory distress.     Breath sounds: Normal breath sounds. No wheezing or rales.  Abdominal:     General: Bowel sounds are normal. There is no distension.     Palpations: Abdomen is soft.     Tenderness: There is no abdominal tenderness. There is no rebound.  Musculoskeletal:     Cervical back: Normal range of motion.  Skin:    General: Skin is warm and dry.  Neurological:     Mental Status: She is alert and oriented to person, place, and time.     Coordination: Coordination normal.     Vitals:   08/08/22 0844 08/08/22 0851  BP: (!) 140/62 138/70  Pulse: 65   Temp: 99 F (37.2 C)   TempSrc: Oral   SpO2: 95%   Weight: 144 lb (65.3 kg)   Height: '5\' 1"'$  (1.549 m)     Assessment & Plan:

## 2022-08-11 ENCOUNTER — Other Ambulatory Visit: Payer: Self-pay

## 2022-09-11 ENCOUNTER — Encounter (INDEPENDENT_AMBULATORY_CARE_PROVIDER_SITE_OTHER): Payer: Medicare Other | Admitting: Ophthalmology

## 2022-09-11 DIAGNOSIS — H353231 Exudative age-related macular degeneration, bilateral, with active choroidal neovascularization: Secondary | ICD-10-CM

## 2022-09-11 DIAGNOSIS — I10 Essential (primary) hypertension: Secondary | ICD-10-CM

## 2022-09-11 DIAGNOSIS — H35033 Hypertensive retinopathy, bilateral: Secondary | ICD-10-CM | POA: Diagnosis not present

## 2022-09-11 DIAGNOSIS — H43813 Vitreous degeneration, bilateral: Secondary | ICD-10-CM | POA: Diagnosis not present

## 2022-09-20 DIAGNOSIS — Z23 Encounter for immunization: Secondary | ICD-10-CM | POA: Diagnosis not present

## 2022-10-09 ENCOUNTER — Encounter (INDEPENDENT_AMBULATORY_CARE_PROVIDER_SITE_OTHER): Payer: Medicare Other | Admitting: Ophthalmology

## 2022-10-09 ENCOUNTER — Ambulatory Visit (INDEPENDENT_AMBULATORY_CARE_PROVIDER_SITE_OTHER): Payer: Medicare Other | Admitting: Podiatry

## 2022-10-09 DIAGNOSIS — H35033 Hypertensive retinopathy, bilateral: Secondary | ICD-10-CM | POA: Diagnosis not present

## 2022-10-09 DIAGNOSIS — H353231 Exudative age-related macular degeneration, bilateral, with active choroidal neovascularization: Secondary | ICD-10-CM | POA: Diagnosis not present

## 2022-10-09 DIAGNOSIS — I10 Essential (primary) hypertension: Secondary | ICD-10-CM

## 2022-10-09 DIAGNOSIS — M79675 Pain in left toe(s): Secondary | ICD-10-CM | POA: Diagnosis not present

## 2022-10-09 DIAGNOSIS — H43813 Vitreous degeneration, bilateral: Secondary | ICD-10-CM

## 2022-10-09 DIAGNOSIS — E1169 Type 2 diabetes mellitus with other specified complication: Secondary | ICD-10-CM

## 2022-10-09 DIAGNOSIS — B351 Tinea unguium: Secondary | ICD-10-CM | POA: Diagnosis not present

## 2022-10-09 DIAGNOSIS — M79674 Pain in right toe(s): Secondary | ICD-10-CM | POA: Diagnosis not present

## 2022-10-09 NOTE — Progress Notes (Signed)
  Subjective:  Patient ID: Amber Roman, female    DOB: Mar 13, 1946,  MRN: [id number]  Chief Complaint  Patient presents with   Nail Problem    Thick painful toenails, 5 month follow up    Diabetes    At risk foot care    76 y.o. female presents with the above complaint. History confirmed with patient.  Doing well no new issues the nails are painful again, she is getting ready return to [place] for the winter  Objective:  Physical Exam: warm, good capillary refill, no trophic changes or ulcerative lesions, normal DP and PT pulses, and normal sensory exam. Left Foot: dystrophic yellowed discolored nail plates with subungual debris Right Foot: dystrophic yellowed discolored nail plates with subungual debris  Assessment:   1. Pain due to onychomycosis of toenails of both feet   2. Type 2 diabetes mellitus with other specified complication, without long-term current use of insulin ([place])      Plan:  Patient was evaluated and treated and all questions answered.  Patient educated on diabetes. Discussed proper diabetic foot care and discussed risks and complications of disease. Educated patient in depth on reasons to return to the office immediately should he/she discover anything concerning or new on the feet. All questions answered. Discussed proper shoes as well.   Discussed the etiology and treatment options for the condition in detail with the patient. Educated patient on the topical and oral treatment options for mycotic nails. Recommended debridement of the nails today. Sharp and mechanical debridement performed of all painful and mycotic nails today. Nails debrided in length and thickness using a nail nipper to level of comfort. Discussed treatment options including appropriate shoe gear. Follow up as needed for painful nails.   Return if symptoms worsen or fail to improve.

## 2022-10-16 DIAGNOSIS — H26492 Other secondary cataract, left eye: Secondary | ICD-10-CM | POA: Diagnosis not present

## 2022-10-16 DIAGNOSIS — Z961 Presence of intraocular lens: Secondary | ICD-10-CM | POA: Diagnosis not present

## 2022-10-16 DIAGNOSIS — H353232 Exudative age-related macular degeneration, bilateral, with inactive choroidal neovascularization: Secondary | ICD-10-CM | POA: Diagnosis not present

## 2022-10-17 ENCOUNTER — Ambulatory Visit: Payer: Medicare Other | Admitting: Podiatry

## 2022-10-19 ENCOUNTER — Other Ambulatory Visit: Payer: Self-pay | Admitting: Internal Medicine

## 2022-11-14 DIAGNOSIS — H353231 Exudative age-related macular degeneration, bilateral, with active choroidal neovascularization: Secondary | ICD-10-CM | POA: Diagnosis not present

## 2022-11-24 DIAGNOSIS — Z23 Encounter for immunization: Secondary | ICD-10-CM | POA: Diagnosis not present

## 2022-12-26 DIAGNOSIS — H353231 Exudative age-related macular degeneration, bilateral, with active choroidal neovascularization: Secondary | ICD-10-CM | POA: Diagnosis not present

## 2023-01-16 ENCOUNTER — Other Ambulatory Visit: Payer: Self-pay | Admitting: Internal Medicine

## 2023-02-19 DIAGNOSIS — H353231 Exudative age-related macular degeneration, bilateral, with active choroidal neovascularization: Secondary | ICD-10-CM | POA: Diagnosis not present

## 2023-02-23 DIAGNOSIS — H353231 Exudative age-related macular degeneration, bilateral, with active choroidal neovascularization: Secondary | ICD-10-CM | POA: Diagnosis not present

## 2023-03-26 DIAGNOSIS — H43813 Vitreous degeneration, bilateral: Secondary | ICD-10-CM | POA: Diagnosis not present

## 2023-03-26 DIAGNOSIS — H353231 Exudative age-related macular degeneration, bilateral, with active choroidal neovascularization: Secondary | ICD-10-CM | POA: Diagnosis not present

## 2023-03-26 DIAGNOSIS — H43393 Other vitreous opacities, bilateral: Secondary | ICD-10-CM | POA: Diagnosis not present

## 2023-03-26 DIAGNOSIS — H35033 Hypertensive retinopathy, bilateral: Secondary | ICD-10-CM | POA: Diagnosis not present

## 2023-03-30 ENCOUNTER — Other Ambulatory Visit: Payer: Self-pay | Admitting: Internal Medicine

## 2023-04-02 DIAGNOSIS — M545 Low back pain, unspecified: Secondary | ICD-10-CM | POA: Diagnosis not present

## 2023-04-02 DIAGNOSIS — M1712 Unilateral primary osteoarthritis, left knee: Secondary | ICD-10-CM | POA: Diagnosis not present

## 2023-04-03 DIAGNOSIS — M5416 Radiculopathy, lumbar region: Secondary | ICD-10-CM | POA: Diagnosis not present

## 2023-04-11 IMAGING — DX DG CHEST 2V
2 series · 2 of 2 positions shown · non-contrast
Comparison: 05/11/2020

CLINICAL DATA: Former smoker

EXAM:
CHEST - 2 VIEW

[chest pa]
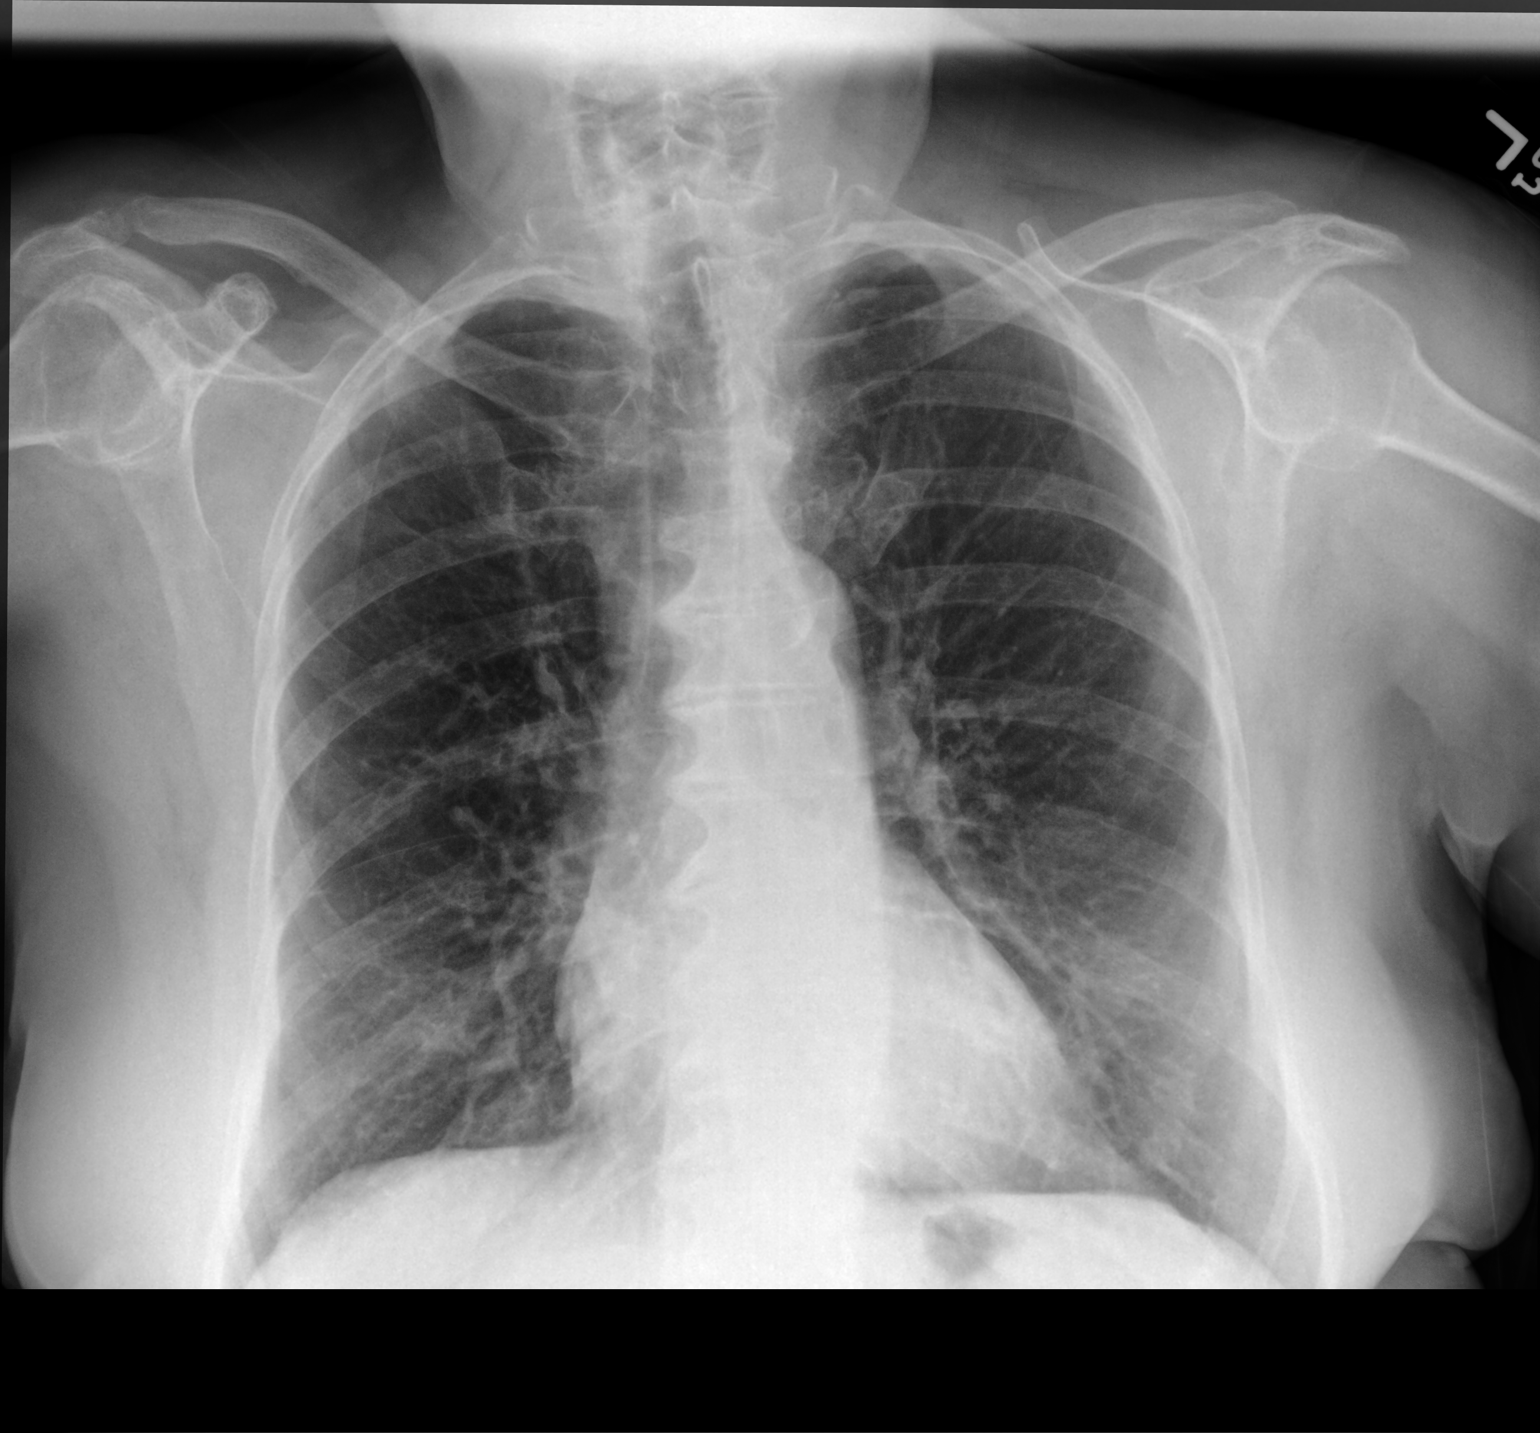

[chest lat]
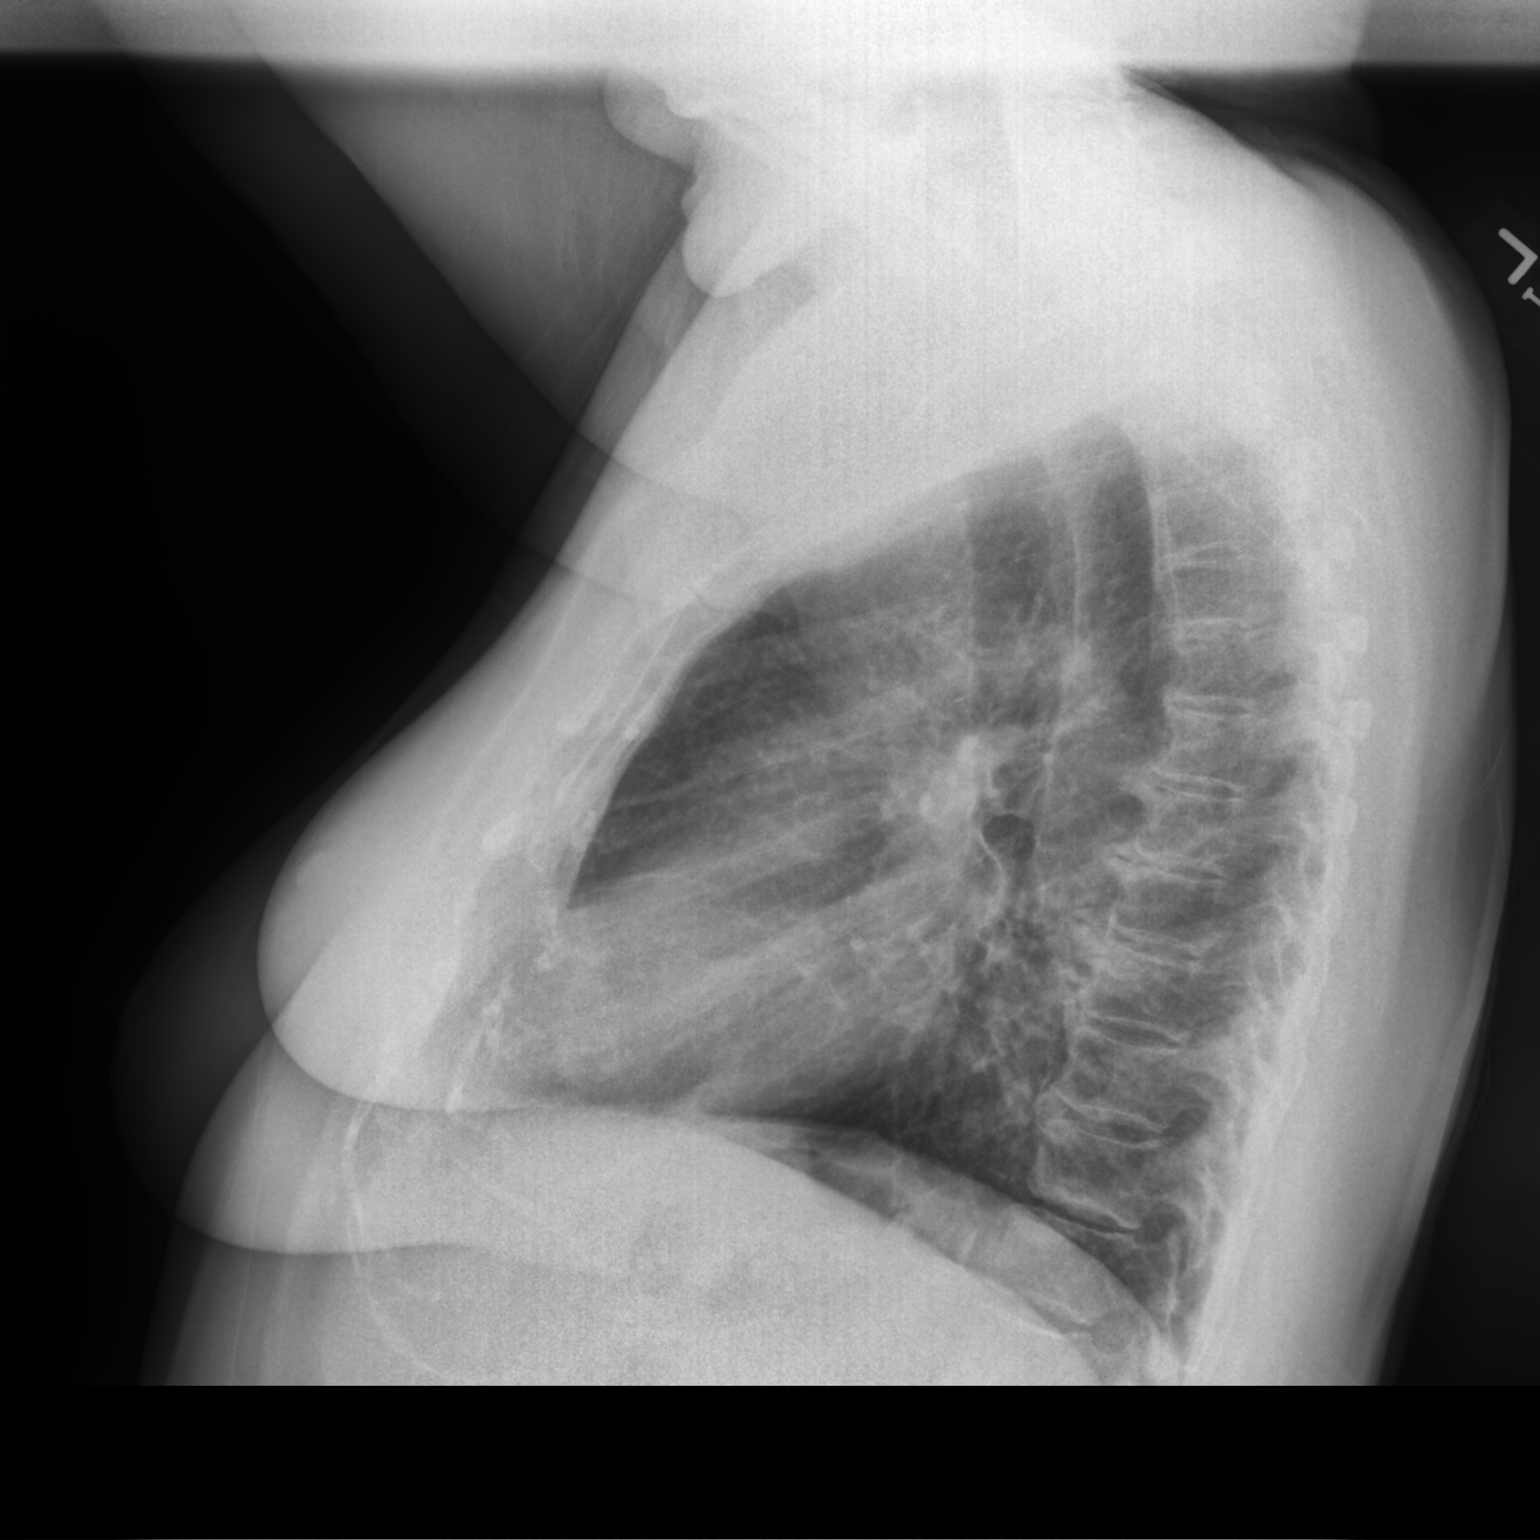

[2 of 2 positions shown; findings below may reference images not displayed]

FINDINGS: Cardiomediastinal silhouette and pulmonary vasculature are within
normal limits.

Patchy opacities at the lung bases are new since prior examination.
One of the opacities at the right lung base is somewhat nodular
measures 10 mm in diameter.
IMPRESSION: Interval development of patchy opacities at the lung bases, 1 of
which appears somewhat nodular on the right side. Further evaluation
with chest CT should be performed to exclude underlying mass.

These results will be called to the ordering clinician or
representative by the Radiologist Assistant, and communication
documented in the PACS or [REDACTED].

## 2023-04-12 DIAGNOSIS — M545 Low back pain, unspecified: Secondary | ICD-10-CM | POA: Diagnosis not present

## 2023-04-17 ENCOUNTER — Other Ambulatory Visit: Payer: Self-pay | Admitting: Internal Medicine

## 2023-04-17 DIAGNOSIS — M5416 Radiculopathy, lumbar region: Secondary | ICD-10-CM | POA: Diagnosis not present

## 2023-04-17 MED ORDER — IRBESARTAN-HYDROCHLOROTHIAZIDE 300-12.5 MG PO TABS: 1.0000 | ORAL_TABLET | Freq: Every day | ORAL | 0 refills | Status: DC

## 2023-04-17 NOTE — Telephone Encounter (Signed)
Script for [employer] out. Fax to [facility] via [email or web address].Marland [email or web address] Chute

## 2023-04-17 NOTE — Addendum Note (Signed)
Addended by: Deatra James on: 04/17/2023 11:20 AM   Modules accepted: Orders

## 2023-04-19 ENCOUNTER — Ambulatory Visit (INDEPENDENT_AMBULATORY_CARE_PROVIDER_SITE_OTHER): Payer: Medicare Other | Admitting: Nurse Practitioner

## 2023-04-19 ENCOUNTER — Encounter: Payer: Self-pay | Admitting: Nurse Practitioner

## 2023-04-19 VITALS — BP 124/56 | HR 63 | Temp 98.3°F | Ht 61.5 in | Wt 149.4 lb

## 2023-04-19 DIAGNOSIS — K580 Irritable bowel syndrome with diarrhea: Secondary | ICD-10-CM

## 2023-04-19 DIAGNOSIS — J4520 Mild intermittent asthma, uncomplicated: Secondary | ICD-10-CM

## 2023-04-19 DIAGNOSIS — R911 Solitary pulmonary nodule: Secondary | ICD-10-CM | POA: Diagnosis not present

## 2023-04-19 DIAGNOSIS — R918 Other nonspecific abnormal finding of lung field: Secondary | ICD-10-CM | POA: Diagnosis not present

## 2023-04-19 DIAGNOSIS — K589 Irritable bowel syndrome without diarrhea: Secondary | ICD-10-CM | POA: Insufficient documentation

## 2023-04-19 MED ORDER — ALBUTEROL SULFATE HFA 108 (90 BASE) MCG/ACT IN AERS: INHALATION_SPRAY | RESPIRATORY_TRACT | 2 refills | Status: DC

## 2023-04-19 MED ORDER — DIPHENOXYLATE-ATROPINE 2.5-0.025 MG PO TABS: 1.0000 | ORAL_TABLET | Freq: Four times a day (QID) | ORAL | 0 refills | Status: AC | PRN

## 2023-04-19 NOTE — Assessment & Plan Note (Signed)
PDMP reviewed. Last fill 08/2021. No aberrant behavior. Side effect profile reviewed. Will refill today for PRN use. Understands to notify her PCP if symptoms worsen or she's having to use the medication more frequently.

## 2023-04-19 NOTE — Progress Notes (Signed)
@Patient  ID: Amber Roman, female    DOB: [birth year], 77 y.o.   MRN: [id number]  Chief Complaint  Patient presents with   Follow-up    No flare ups,     Referring provider: Myrlene Broker, *  HPI: 77 year old female, former smoker followed for asthma and lung nodules. She is a patient of Dr. Roxy Cedar and last seen in office 04/24/2022. Past medical history significant HTN, OSA on CPAP, HLD, DM, insomnia.   TEST/EVENTS:  07/16/2020 PFT: FVC 88, FEV1 94, ratio 84, TLC 92, DLCOcor 89. No BD 04/27/2022 CT chest: mild atherosclerosis. Multiple small solid and ground glass nodules without change from 05/2021 CT. Largest ground glass is 6 mm in LLL; will need follow up CT in 2 years.   04/24/2022: OV with Dr. Maple Hudson. Would like CXR or something to follow her nodules. Missed her last appointment. Rescheduled CT. Otherwise doing good. Occasional use of rescue. No acute events. [place] and winter in [place]; tends to breathe better there.   04/19/2023: Today - follow up Patient presents today for yearly follow up. She has been doing well since she was here last. She has not had any flares requiring steroids/abx. She is using her symbicort on an as needed basis. Usually only has to use it a few times a week when she's back in [place]. Otherwise, her breathing seems to do well in [place]. She uses her rescue maybe 3 times a month. No significant cough, chest congestion, or wheezing. She has some occasional episodes of diarrhea/abd cramping. She was prescribed lomotil by Dr. Maple Hudson years ago and takes it maybe 4-5 times a year. Would like a refill. Symptoms are unchanged; ongoing for many years. No bloody stools or changes in bowel habits. No changes in appetite, nausea, vomiting, abd pain.   Allergies  Allergen Reactions   Sulfonamide Derivatives Other (See Comments)    Childhood allergy     Immunization History  Administered Date(s) Administered   DT (Pediatric) 06/16/2013    Fluad Quad(high Dose 65+) 09/19/2019, 09/12/2021   Influenza Split 08/19/2011, 09/17/2013, 11/16/2014, 09/15/2015, 09/14/2016   Influenza Whole 09/26/2006, 09/17/2009, 10/06/2010, 09/16/2012   Influenza, High Dose Seasonal PF 10/15/2018   Influenza-Unspecified 09/26/2017, 08/30/2018   Moderna Sars-Covid-2 Vaccination 01/23/2020, 02/19/2020, 10/29/2020, 05/04/2021   Pneumococcal Conjugate-13 10/22/2018   Pneumococcal Polysaccharide-23 10/29/2003, 09/17/2009   Pneumococcal-Unspecified 05/22/2014, 07/06/2014   Td 07/20/2003   Tdap 06/19/2013   Zoster Recombinat (Shingrix) 09/17/2020, 11/19/2020   Zoster, Live 05/30/2011    Past Medical History:  Diagnosis Date   Allergic conjunctivitis    Arthritis    Basal cell carcinoma 10/29/1997   ledt nasal ala (MOHS)   Bronchitis    Bruxism, sleep-related 04/13/2014   Depression    Esophageal reflux    Family history of heart disease    H/O abdominal hysterectomy    w/o recurrence   Hyperlipidemia    Hypertension    Insomnia 04/13/2014   Obstructive sleep apnea    improved on C Pap   Squamous cell carcinoma of skin 09/20/2015   left upper arm (Txpbx)   Uterine cancer (HCC)    hysterectomy    Tobacco History: Social History   Tobacco Use  Smoking Status Former   Packs/day: 2.00   Years: 15.00   Additional pack years: 0.00   Total pack years: 30.00   Types: Cigarettes   Quit date: 12/18/1978   Years since quitting: 44.3  Smokeless Tobacco Never  Counseling given: Not Answered   Outpatient Medications Prior to Visit  Medication Sig Dispense Refill   azelastine (OPTIVAR) 0.05 % ophthalmic solution Place 1 drop into both eyes 2 (two) times daily. 6 mL 3   b complex vitamins capsule Take 1 capsule by mouth daily.     BREO ELLIPTA 100-25 MCG/ACT AEPB      budesonide-formoterol (SYMBICORT) 160-4.5 MCG/ACT inhaler Inhale 2 puffs into the lungs 2 (two) times daily. Via [employer] pt assistance 3 each 3   buPROPion (WELLBUTRIN XL) 150  MG 24 hr tablet Take 150 mg by mouth every morning.     DULoxetine (CYMBALTA) 60 MG capsule Take 1 capsule by mouth daily.  1   fluticasone (FLONASE) 50 MCG/ACT nasal spray 1-2 puffs each nostril once daily while needed 16 g prn   gabapentin (NEURONTIN) 300 MG capsule TAKE 1 CAPSULE(300 MG) BY MOUTH TWICE DAILY 180 capsule 0   guaiFENesin (MUCINEX) 600 MG 12 hr tablet Take 1,200 mg by mouth 2 (two) times daily as needed. For chest tightness     ipratropium-albuterol (DUONEB) 0.5-2.5 (3) MG/3ML SOLN Take 3 mLs by nebulization every 6 (six) hours as needed. 75 mL 12   irbesartan-hydrochlorothiazide (AVALIDE) 300-12.5 MG tablet Take 1 tablet by mouth daily. Annual appt due in May must see provider for future refills . 30 tablet 0   meloxicam (MOBIC) 15 MG tablet TAKE 1 TABLET(15 MG) BY MOUTH DAILY 90 tablet 3   metFORMIN (GLUCOPHAGE) 500 MG tablet TAKE 1 TABLET(500 MG) BY MOUTH DAILY WITH BREAKFAST 90 tablet 3   metoprolol succinate (TOPROL-XL) 100 MG 24 hr tablet Take 1 tablet (100 mg total) by mouth daily. Annual appt due in May must see provider for future refills 90 tablet 1   Multiple Vitamin (MULTIVITAMIN) tablet Take 1 tablet by mouth daily.     Multiple Vitamins-Minerals (PRESERVISION AREDS PO) Take by mouth daily.     Multiple Vitamins-Minerals (VITAMIN D3 COMPLETE PO) Take 50 mcg by mouth daily.     Nebulizers (COMPRESSOR NEBULIZER) MISC As directed 1 each 0   rosuvastatin (CRESTOR) 20 MG tablet TAKE 1 TABLET(20 MG) BY MOUTH DAILY Annual appt due in May must see provider for future refills 30 tablet 0   traZODone (DESYREL) 50 MG tablet TAKE 1 TABLET(50 MG) BY MOUTH AT BEDTIME AS NEEDED FOR SLEEP 30 tablet 5   albuterol (PROAIR HFA) 108 (90 Base) MCG/ACT inhaler INHALE 2 PUFFS INTO THE LUNGS EVERY 6 HOURS AS NEEDED FOR WHEEZING OR SHORTNESS OF BREATH 18 g 12   diphenoxylate-atropine (LOMOTIL) 2.5-0.025 MG tablet Take 1 tablet by mouth 4 (four) times daily as needed. For stomach 30 tablet 5    No facility-administered medications prior to visit.     Review of Systems:   Constitutional: No weight loss or gain, night sweats, fevers, chills, fatigue, or lassitude. HEENT: No headaches, difficulty swallowing, tooth/dental problems, or sore throat. No sneezing, itching, ear ache, nasal congestion, or post nasal drip CV:  No chest pain, orthopnea, PND, swelling in lower extremities, anasarca, dizziness, palpitations, syncope Resp: +occasional shortness of breath with exertion; occasional chest tightness. No excess mucus or change in color of mucus. No productive or non-productive. No hemoptysis. No wheezing.  No chest wall deformity GI:  +occasional diarrhea/abd cramping. No heartburn, indigestion, abdominal pain, nausea, vomiting, change in bowel habits, loss of appetite, bloody stools.  GU: No dysuria, change in color of urine, urgency or frequency.   Skin: No rash, lesions, ulcerations MSK:  No  joint pain or swelling.   Neuro: No dizziness or lightheadedness.  Psych: No depression or anxiety. Mood stable.     Physical Exam:  BP (!) 124/56   Pulse 63   Temp 98.3 F (36.8 C) (Oral)   Ht 5' 1.5" (1.562 m)   Wt 149 lb 6.4 oz (67.8 kg)   SpO2 97%   BMI 27.77 kg/m   GEN: Pleasant, interactive, well-appearing; in no acute distress HEENT:  Normocephalic and atraumatic. PERRLA. Sclera white. Nasal turbinates pink, moist and patent bilaterally. No rhinorrhea present. Oropharynx pink and moist, without exudate or edema. No lesions, ulcerations, or postnasal drip.  NECK:  Supple w/ fair ROM. No JVD present. Normal carotid impulses w/o bruits. Thyroid symmetrical with no goiter or nodules palpated. No lymphadenopathy.   CV: RRR, no m/r/g, no peripheral edema. Pulses intact, +2 bilaterally. No cyanosis, pallor or clubbing. PULMONARY:  Unlabored, regular breathing. Clear bilaterally A&P w/o wheezes/rales/rhonchi. No accessory muscle use.  GI: BS present and normoactive. Soft,  non-tender to palpation. No organomegaly or masses detected.  MSK: No erythema, warmth or tenderness. Cap refil <2 sec all extrem. No deformities or joint swelling noted.  Neuro: A/Ox3. No focal deficits noted.   Skin: Warm, no lesions or rashe Psych: Normal affect and behavior. Judgement and thought content appropriate.     Lab Results:  CBC    Component Value Date/Time   WBC 8.3 05/16/2022 0931   RBC 4.69 05/16/2022 0931   HGB 12.8 05/16/2022 0931   HCT 38.6 05/16/2022 0931   PLT 229.0 05/16/2022 0931   MCV 82.2 05/16/2022 0931   MCH 27.9 01/12/2013 0433   MCHC 33.2 05/16/2022 0931   RDW 16.3 (H) 05/16/2022 0931   LYMPHSABS 2.1 09/03/2017 1008   MONOABS 1.2 (H) 09/03/2017 1008   EOSABS 0.2 09/03/2017 1008   BASOSABS 0.1 09/03/2017 1008    BMET    Component Value Date/Time   NA 141 05/16/2022 0931   NA 141 09/19/2017 0000   K 4.5 05/16/2022 0931   CL 103 05/16/2022 0931   CO2 29 05/16/2022 0931   GLUCOSE 118 (H) 05/16/2022 0931   BUN 18 05/16/2022 0931   BUN 22 (A) 09/19/2017 0000   CREATININE 1.04 05/16/2022 0931   CALCIUM 9.9 05/16/2022 0931   GFRNONAA >90 01/11/2013 0444   GFRAA >90 01/11/2013 0444    BNP No results found for: "BNP"   Imaging:  No results found.       Latest Ref Rng & Units 07/16/2020    2:59 PM 09/17/2017    8:44 AM  PFT Results  FVC-Pre L 2.21  2.52   FVC-Predicted Pre % 88  97   FVC-Post L 2.30  2.50   FVC-Predicted Post % 91  96   Pre FEV1/FVC % % 80  81   Post FEV1/FCV % % 84  85   FEV1-Pre L 1.77  2.05   FEV1-Predicted Pre % 94  105   FEV1-Post L 1.94  2.13   DLCO uncorrected ml/min/mmHg 15.68  16.23   DLCO UNC% % 89  77   DLCO corrected ml/min/mmHg 15.68  16.13   DLCO COR %Predicted % 89  77   DLVA Predicted % 91  85   TLC L 4.31  4.34   TLC % Predicted % 92  92   RV % Predicted % 94  92     No results found for: "NITRICOXIDE"      Assessment & Plan:   Mild  intermittent asthma Compensated on current  regimen. No acute flares requiring abx/steroids or hospitalizations. She will continue ICS/LABA on a PRN basis. Asthma action plan in place.   Patient Instructions  Continue Albuterol inhaler 2 puffs or 3 mL neb every 6 hours as needed for shortness of breath or wheezing. Notify if symptoms persist despite rescue inhaler/neb use.  Continue Symbicort 2 puffs Twice daily as needed for shortness of breath, wheezing. Brush tongue and rinse mouth afterwards Continue flonase nasal spray Continue lomotil 1 tab as needed for diarrhea Continue using CPAP nightly   Follow up in one year with Dr. Maple Hudson. If symptoms worsen, please contact office for sooner follow up or seek emergency care.    Lung nodule Multiple small solid and ground glass nodules are all stable from 05/2021 to 04/2022. 6 mm ground glass nodule, LLL, needs f/u every 2 years until 5 years of stability is documented. Repeat CT chest in May 2025.   IBS (irritable bowel syndrome) PDMP reviewed. Last fill 08/2021. No aberrant behavior. Side effect profile reviewed. Will refill today for PRN use. Understands to notify her PCP if symptoms worsen or she's having to use the medication more frequently.    I spent 32 minutes of dedicated to the care of this patient on the date of this encounter to include pre-visit review of records, face-to-face time with the patient discussing conditions above, post visit ordering of testing, clinical documentation with the electronic health record, making appropriate referrals as documented, and communicating necessary findings to members of the patients care team.  Noemi Chapel, NP 04/19/2023  Pt aware and understands NP's role.

## 2023-04-19 NOTE — Assessment & Plan Note (Signed)
Multiple small solid and ground glass nodules are all stable from 05/2021 to 04/2022. 6 mm ground glass nodule, LLL, needs f/u every 2 years until 5 years of stability is documented. Repeat CT chest in May 2025.

## 2023-04-19 NOTE — Assessment & Plan Note (Signed)
Compensated on current regimen. No acute flares requiring abx/steroids or hospitalizations. She will continue ICS/LABA on a PRN basis. Asthma action plan in place.   Patient Instructions  Continue Albuterol inhaler 2 puffs or 3 mL neb every 6 hours as needed for shortness of breath or wheezing. Notify if symptoms persist despite rescue inhaler/neb use.  Continue Symbicort 2 puffs Twice daily as needed for shortness of breath, wheezing. Brush tongue and rinse mouth afterwards Continue flonase nasal spray Continue lomotil 1 tab as needed for diarrhea Continue using CPAP nightly   Follow up in one year with Dr. Maple Hudson. If symptoms worsen, please contact office for sooner follow up or seek emergency care.

## 2023-04-19 NOTE — Patient Instructions (Addendum)
Continue Albuterol inhaler 2 puffs or 3 mL neb every 6 hours as needed for shortness of breath or wheezing. Notify if symptoms persist despite rescue inhaler/neb use.  Continue Symbicort 2 puffs Twice daily as needed for shortness of breath, wheezing. Brush tongue and rinse mouth afterwards Continue flonase nasal spray Continue lomotil 1 tab as needed for diarrhea Continue using CPAP nightly   Follow up in one year with Dr. Maple Hudson. If symptoms worsen, please contact office for sooner follow up or seek emergency care.

## 2023-04-23 ENCOUNTER — Telehealth: Payer: Self-pay | Admitting: Internal Medicine

## 2023-04-23 NOTE — Telephone Encounter (Signed)
Contacted Amber Roman to schedule their annual wellness visit. Appointment made for 05/08/2023.  [facility] Care Guide [facility] AWV TEAM Direct Dial: [phone]

## 2023-04-26 ENCOUNTER — Encounter: Payer: Self-pay | Admitting: Podiatry

## 2023-04-26 ENCOUNTER — Ambulatory Visit (INDEPENDENT_AMBULATORY_CARE_PROVIDER_SITE_OTHER): Payer: Medicare Other | Admitting: Podiatry

## 2023-04-26 DIAGNOSIS — E119 Type 2 diabetes mellitus without complications: Secondary | ICD-10-CM

## 2023-04-26 DIAGNOSIS — E1169 Type 2 diabetes mellitus with other specified complication: Secondary | ICD-10-CM

## 2023-04-26 DIAGNOSIS — M79674 Pain in right toe(s): Secondary | ICD-10-CM

## 2023-04-26 NOTE — Progress Notes (Signed)
  Subjective:  Patient ID: Amber Roman, female    DOB: 1946/12/02,  MRN: [id number]  Chief Complaint  Patient presents with   Nail Problem    diabetic foot and nail care    77 y.o. female presents with the above complaint. History confirmed with patient.  Doing well no new issues the nails are painful again, she is getting ready return to [place] for the winter  Objective:  Physical Exam: warm, good capillary refill, no trophic changes or ulcerative lesions, normal DP and PT pulses, and normal sensory exam. Left Foot: dystrophic yellowed discolored nail plates with subungual debris Right Foot: dystrophic yellowed discolored nail plates with subungual debris  Assessment:   1. Pain due to onychomycosis of toenails of both feet   2. Type 2 diabetes mellitus with other specified complication, without long-term current use of insulin (HCC)   3. Encounter for diabetic foot exam [facility])      Plan:  Patient was evaluated and treated and all questions answered.  Patient educated on diabetes. Discussed proper diabetic foot care and discussed risks and complications of disease. Educated patient in depth on reasons to return to the office immediately should he/she discover anything concerning or new on the feet. All questions answered. Discussed proper shoes as well.  Overall low risk for complications with good circulation and sensation.  Annual diabetic foot risk examination performed today  Discussed the etiology and treatment options for the condition in detail with the patient. Recommended debridement of the nails today. Sharp and mechanical debridement performed of all painful and mycotic nails today. Nails debrided in length and thickness using a nail nipper to level of comfort. Discussed treatment options including appropriate shoe gear. Follow up as needed for painful nails.  Long-term we discussed permanent removal of toenails that are bothersome.  She will let me know if she is  interested in this.   Return in about 3 months (around 07/27/2023) for at risk diabetic foot care.

## 2023-04-27 DIAGNOSIS — M5416 Radiculopathy, lumbar region: Secondary | ICD-10-CM | POA: Diagnosis not present

## 2023-05-01 ENCOUNTER — Telehealth: Payer: Self-pay | Admitting: Internal Medicine

## 2023-05-01 NOTE — Telephone Encounter (Signed)
Patient called to see if her MRI from [facility] was received yesterday.  She needs a renal ultrasound scheduled before she see's Dr. Okey Dupre on 05/18/2023.  Please advise patient.  Patient's phone:  [phone]

## 2023-05-02 NOTE — Telephone Encounter (Signed)
Reviewed imaging and non-emergent can be ordered at visit typically insurance will require notes/visit to approve scan.

## 2023-05-02 NOTE — Telephone Encounter (Signed)
Called pt and read back MD message pt verbalized she understood

## 2023-05-03 DIAGNOSIS — H353211 Exudative age-related macular degeneration, right eye, with active choroidal neovascularization: Secondary | ICD-10-CM | POA: Diagnosis not present

## 2023-05-03 DIAGNOSIS — H35033 Hypertensive retinopathy, bilateral: Secondary | ICD-10-CM | POA: Diagnosis not present

## 2023-05-03 DIAGNOSIS — H43393 Other vitreous opacities, bilateral: Secondary | ICD-10-CM | POA: Diagnosis not present

## 2023-05-03 DIAGNOSIS — H353222 Exudative age-related macular degeneration, left eye, with inactive choroidal neovascularization: Secondary | ICD-10-CM | POA: Diagnosis not present

## 2023-05-03 DIAGNOSIS — H43813 Vitreous degeneration, bilateral: Secondary | ICD-10-CM | POA: Diagnosis not present

## 2023-05-08 ENCOUNTER — Ambulatory Visit (INDEPENDENT_AMBULATORY_CARE_PROVIDER_SITE_OTHER): Payer: Medicare Other

## 2023-05-08 VITALS — Ht 61.5 in | Wt 154.0 lb

## 2023-05-08 DIAGNOSIS — Z0000 Encounter for general adult medical examination without abnormal findings: Secondary | ICD-10-CM

## 2023-05-08 NOTE — Patient Instructions (Signed)
Amber Roman , Thank you for taking time to come for your Medicare Wellness Visit. I appreciate your ongoing commitment to your health goals. Please review the following plan we discussed and let me know if I can assist you in the future.   These are the goals we discussed:  Goals      Patient Stated: My goal for 2024 is to [facility] my health.        This is a list of the screening recommended for you and due dates:  Health Maintenance  Topic Date Due   Hepatitis C Screening: USPSTF Recommendation to screen - Ages 40-79 yo.  Never done   COVID-19 Vaccine (5 - 2023-24 season) 08/18/2022   Hemoglobin A1C  02/08/2023   Eye exam for diabetics  04/25/2023   Yearly kidney function blood test for diabetes  05/17/2023   Yearly kidney health urinalysis for diabetes  05/17/2023   DTaP/Tdap/Td vaccine (4 - Td or Tdap) 06/20/2023   Flu Shot  07/19/2023   Complete foot exam   04/25/2024   Medicare Annual Wellness Visit  05/07/2024   Pneumonia Vaccine  Completed   DEXA scan (bone density measurement)  Completed   Zoster (Shingles) Vaccine  Completed   HPV Vaccine  Aged Out   Colon Cancer Screening  Discontinued    Advanced directives: Yes  Conditions/risks identified: Yes  Next appointment: Follow up in one year for your annual wellness visit.   Preventive Care 42 Years and Older, Female Preventive care refers to lifestyle choices and visits with your health care provider that can promote health and wellness. What does preventive care include? A yearly physical exam. This is also called an annual well check. Dental exams once or twice a year. Routine eye exams. Ask your health care provider how often you should have your eyes checked. Personal lifestyle choices, including: Daily care of your teeth and gums. Regular physical activity. Eating a healthy diet. Avoiding tobacco and drug use. Limiting alcohol use. Practicing safe sex. Taking low-dose aspirin every day. Taking vitamin  and mineral supplements as recommended by your health care provider. What happens during an annual well check? The services and screenings done by your health care provider during your annual well check will depend on your age, overall health, lifestyle risk factors, and family history of disease. Counseling  Your health care provider may ask you questions about your: Alcohol use. Tobacco use. Drug use. Emotional well-being. Home and relationship well-being. Sexual activity. Eating habits. History of falls. Memory and ability to understand (cognition). Work and work Astronomer. Reproductive health. Screening  You may have the following tests or measurements: Height, weight, and BMI. Blood pressure. Lipid and cholesterol levels. These may be checked every 5 years, or more frequently if you are over 11 years old. Skin check. Lung cancer screening. You may have this screening every year starting at age 2 if you have a 30-pack-year history of smoking and currently smoke or have quit within the past 15 years. Fecal occult blood test (FOBT) of the stool. You may have this test every year starting at age 50. Flexible sigmoidoscopy or colonoscopy. You may have a sigmoidoscopy every 5 years or a colonoscopy every 10 years starting at age 80. Hepatitis C blood test. Hepatitis B blood test. Sexually transmitted disease (STD) testing. Diabetes screening. This is done by checking your blood sugar (glucose) after you have not eaten for a while (fasting). You may have this done every 1-3 years. Bone density scan. This  is done to screen for osteoporosis. You may have this done starting at age 69. Mammogram. This may be done every 1-2 years. Talk to your health care provider about how often you should have regular mammograms. Talk with your health care provider about your test results, treatment options, and if necessary, the need for more tests. Vaccines  Your health care provider may recommend  certain vaccines, such as: Influenza vaccine. This is recommended every year. Tetanus, diphtheria, and acellular pertussis (Tdap, Td) vaccine. You may need a Td booster every 10 years. Zoster vaccine. You may need this after age 72. Pneumococcal 13-valent conjugate (PCV13) vaccine. One dose is recommended after age 45. Pneumococcal polysaccharide (PPSV23) vaccine. One dose is recommended after age 23. Talk to your health care provider about which screenings and vaccines you need and how often you need them. This information is not intended to replace advice given to you by your health care provider. Make sure you discuss any questions you have with your health care provider. Document Released: 12/31/2015 Document Revised: 08/23/2016 Document Reviewed: 10/05/2015 Elsevier Interactive Patient Education  2017 [employer].  Fall Prevention in the Home Falls can cause injuries. They can happen to people of all ages. There are many things you can do to make your home safe and to help prevent falls. What can I do on the outside of my home? Regularly fix the edges of walkways and driveways and fix any cracks. Remove anything that might make you trip as you walk through a door, such as a raised step or threshold. Trim any bushes or trees on the path to your home. Use bright outdoor lighting. Clear any walking paths of anything that might make someone trip, such as rocks or tools. Regularly check to see if handrails are loose or broken. Make sure that both sides of any steps have handrails. Any raised decks and porches should have guardrails on the edges. Have any leaves, snow, or ice cleared regularly. Use sand or salt on walking paths during winter. Clean up any spills in your garage right away. This includes oil or grease spills. What can I do in the bathroom? Use night lights. Install grab bars by the toilet and in the tub and shower. Do not use towel bars as grab bars. Use non-skid mats or  decals in the tub or shower. If you need to sit down in the shower, use a plastic, non-slip stool. Keep the floor dry. Clean up any water that spills on the floor as soon as it happens. Remove soap buildup in the tub or shower regularly. Attach bath mats securely with double-sided non-slip rug tape. Do not have throw rugs and other things on the floor that can make you trip. What can I do in the bedroom? Use night lights. Make sure that you have a light by your bed that is easy to reach. Do not use any sheets or blankets that are too big for your bed. They should not hang down onto the floor. Have a firm chair that has side arms. You can use this for support while you get dressed. Do not have throw rugs and other things on the floor that can make you trip. What can I do in the kitchen? Clean up any spills right away. Avoid walking on wet floors. Keep items that you use a lot in easy-to-reach places. If you need to reach something above you, use a strong step stool that has a grab bar. Keep electrical cords out  of the way. Do not use floor polish or wax that makes floors slippery. If you must use wax, use non-skid floor wax. Do not have throw rugs and other things on the floor that can make you trip. What can I do with my stairs? Do not leave any items on the stairs. Make sure that there are handrails on both sides of the stairs and use them. Fix handrails that are broken or loose. Make sure that handrails are as long as the stairways. Check any carpeting to make sure that it is firmly attached to the stairs. Fix any carpet that is loose or worn. Avoid having throw rugs at the top or bottom of the stairs. If you do have throw rugs, attach them to the floor with carpet tape. Make sure that you have a light switch at the top of the stairs and the bottom of the stairs. If you do not have them, ask someone to add them for you. What else can I do to help prevent falls? Wear shoes that: Do not  have high heels. Have rubber bottoms. Are comfortable and fit you well. Are closed at the toe. Do not wear sandals. If you use a stepladder: Make sure that it is fully opened. Do not climb a closed stepladder. Make sure that both sides of the stepladder are locked into place. Ask someone to hold it for you, if possible. Clearly mark and make sure that you can see: Any grab bars or handrails. First and last steps. Where the edge of each step is. Use tools that help you move around (mobility aids) if they are needed. These include: Canes. Walkers. Scooters. Crutches. Turn on the lights when you go into a dark area. Replace any light bulbs as soon as they burn out. Set up your furniture so you have a clear path. Avoid moving your furniture around. If any of your floors are uneven, fix them. If there are any pets around you, be aware of where they are. Review your medicines with your doctor. Some medicines can make you feel dizzy. This can increase your chance of falling. Ask your doctor what other things that you can do to help prevent falls. This information is not intended to replace advice given to you by your health care provider. Make sure you discuss any questions you have with your health care provider. Document Released: 09/30/2009 Document Revised: 05/11/2016 Document Reviewed: 01/08/2015 Elsevier Interactive Patient Education  2017 [employer].

## 2023-05-08 NOTE — Progress Notes (Signed)
I connected with  Eulis Canner on 05/08/23 by a audio enabled telemedicine application and verified that I am speaking with the correct person using two identifiers.  Patient Location: Home  Provider Location: Office/Clinic  I discussed the limitations of evaluation and management by telemedicine. The patient expressed understanding and agreed to proceed.  Subjective:   Amber Roman is a 77 y.o. female who presents for Medicare Annual (Subsequent) preventive examination.  Review of Systems     Cardiac Risk Factors include: advanced age (>52men, >48 women);diabetes mellitus;dyslipidemia;family history of premature cardiovascular disease;hypertension     Objective:    Today's Vitals   05/08/23 1433  Weight: 154 lb (69.9 kg)  Height: 5' 1.5" (1.562 m)  PainSc: 0-No pain   Body mass index is 28.63 kg/m.     05/08/2023    2:34 PM 05/04/2022    9:50 AM 07/04/2021    2:00 PM 01/10/2013    2:06 PM 01/10/2013    6:13 AM 12/31/2012   10:03 AM  Advanced Directives  Does Patient Have a Medical Advance Directive? Yes Yes Yes Patient has advance directive, copy not in chart Patient does not have advance directive   Type of Advance Directive Healthcare Power of [place];Living will Healthcare Power of [place];Living will    Healthcare Power of [place];Living will  Does patient want to make changes to medical advance directive?  No - Patient declined No - Patient declined     Copy of Healthcare Power of Attorney in Chart? No - copy requested No - copy requested      Pre-existing out of facility DNR order (yellow form or pink MOST form)    No  No    Current Medications (verified) Outpatient Encounter Medications as of 05/08/2023  Medication Sig   albuterol (PROAIR HFA) 108 (90 Base) MCG/ACT inhaler INHALE 2 PUFFS INTO THE LUNGS EVERY 6 HOURS AS NEEDED FOR WHEEZING OR SHORTNESS OF BREATH   azelastine (OPTIVAR) 0.05 % ophthalmic solution Place 1 drop into both eyes 2 (two) times daily.    b complex vitamins capsule Take 1 capsule by mouth daily.   BREO ELLIPTA 100-25 MCG/ACT AEPB    budesonide-formoterol (SYMBICORT) 160-4.5 MCG/ACT inhaler Inhale 2 puffs into the lungs 2 (two) times daily. Via [employer] pt assistance   buPROPion (WELLBUTRIN XL) 150 MG 24 hr tablet Take 150 mg by mouth every morning.   diphenoxylate-atropine (LOMOTIL) 2.5-0.025 MG tablet Take 1 tablet by mouth 4 (four) times daily as needed. For diarrhea   DULoxetine (CYMBALTA) 60 MG capsule Take 1 capsule by mouth daily.   fluticasone (FLONASE) 50 MCG/ACT nasal spray 1-2 puffs each nostril once daily while needed   gabapentin (NEURONTIN) 300 MG capsule TAKE 1 CAPSULE(300 MG) BY MOUTH TWICE DAILY   guaiFENesin (MUCINEX) 600 MG 12 hr tablet Take 1,200 mg by mouth 2 (two) times daily as needed. For chest tightness   ipratropium-albuterol (DUONEB) 0.5-2.5 (3) MG/3ML SOLN Take 3 mLs by nebulization every 6 (six) hours as needed.   irbesartan-hydrochlorothiazide (AVALIDE) 300-12.5 MG tablet Take 1 tablet by mouth daily. Annual appt due in May must see provider for future refills .   meloxicam (MOBIC) 15 MG tablet TAKE 1 TABLET(15 MG) BY MOUTH DAILY   metFORMIN (GLUCOPHAGE) 500 MG tablet TAKE 1 TABLET(500 MG) BY MOUTH DAILY WITH BREAKFAST   metoprolol succinate (TOPROL-XL) 100 MG 24 hr tablet Take 1 tablet (100 mg total) by mouth daily. Annual appt due in May must see provider for future refills  Multiple Vitamin (MULTIVITAMIN) tablet Take 1 tablet by mouth daily.   Multiple Vitamins-Minerals (PRESERVISION AREDS PO) Take by mouth daily.   Multiple Vitamins-Minerals (VITAMIN D3 COMPLETE PO) Take 50 mcg by mouth daily.   Nebulizers (COMPRESSOR NEBULIZER) MISC As directed   rosuvastatin (CRESTOR) 20 MG tablet TAKE 1 TABLET(20 MG) BY MOUTH DAILY Annual appt due in May must see provider for future refills   traZODone (DESYREL) 50 MG tablet TAKE 1 TABLET(50 MG) BY MOUTH AT BEDTIME AS NEEDED FOR SLEEP   No  facility-administered encounter medications on file as of 05/08/2023.    Allergies (verified) Sulfonamide derivatives   History: Past Medical History:  Diagnosis Date   Allergic conjunctivitis    Arthritis    Basal cell carcinoma 10/29/1997   ledt nasal ala (MOHS)   Bronchitis    Bruxism, sleep-related 04/13/2014   Depression    Esophageal reflux    Family history of heart disease    H/O abdominal hysterectomy    w/o recurrence   Hyperlipidemia    Hypertension    Insomnia 04/13/2014   Obstructive sleep apnea    improved on C Pap   Squamous cell carcinoma of skin 09/20/2015   left upper arm (Txpbx)   Uterine cancer (HCC)    hysterectomy   Past Surgical History:  Procedure Laterality Date   BILATERAL SALPINGOOPHORECTOMY     CARPAL TUNNEL RELEASE     bilateral   DOPPLER ECHOCARDIOGRAPHY     NASAL SEPTUM SURGERY     NM MYOVIEW LTD     ROTATOR CUFF REPAIR Right    TOTAL ABDOMINAL HYSTERECTOMY     TOTAL HIP ARTHROPLASTY  01/10/2013   Procedure: TOTAL HIP ARTHROPLASTY ANTERIOR APPROACH;  Surgeon: Kathryne Hitch, MD;  Location: [facility];  Service: Orthopedics;  Laterality: Right;   Family History  Problem Relation Age of Onset   Lung disease Mother    Heart attack Father    High blood pressure Father    Multiple sclerosis Father    Diabetes Mellitus II Sister    High blood pressure Sister    Colon cancer Brother    High blood pressure Brother    Sleep apnea Neg Hx    Social History   Socioeconomic History   Marital status: Single    Spouse name: Not on file   Number of children: 1   Years of education: College   Highest education level: Not on file  Occupational History   Not on file  Tobacco Use   Smoking status: Former    Packs/day: 2.00    Years: 15.00    Additional pack years: 0.00    Total pack years: 30.00    Types: Cigarettes    Quit date: 12/18/1978    Years since quitting: 44.4   Smokeless tobacco: Never  Vaping Use   Vaping Use: Never used   Substance and Sexual Activity   Alcohol use: No   Drug use: No    Comment: hx of marijuana use    Sexual activity: Not on file  Other Topics Concern   Not on file  Social History Narrative   Patient is single.   Patient has a college education.   Patient drinks one caffeine drink per day.   Patient is right-handed.   Patient has one child.         Social Determinants of Health   Financial Resource Strain: Low Risk  (05/08/2023)   Overall Financial Resource Strain (CARDIA)    Difficulty  of Paying Living Expenses: Not hard at all  Food Insecurity: No Food Insecurity (05/08/2023)   Hunger Vital Sign    Worried About Running Out of Food in the Last Year: Never true    Ran Out of Food in the Last Year: Never true  Transportation Needs: No Transportation Needs (05/08/2023)   PRAPARE - Administrator, Civil Service (Medical): No    Lack of Transportation (Non-Medical): No  Physical Activity: Sufficiently Active (05/08/2023)   Exercise Vital Sign    Days of Exercise per Week: 7 days    Minutes of Exercise per Session: 30 min  Stress: No Stress Concern Present (05/08/2023)   [employer] of Occupational Health - Occupational Stress Questionnaire    Feeling of Stress : Not at all  Social Connections: Moderately Integrated (05/08/2023)   Social Connection and Isolation Panel [NHANES]    Frequency of Communication with Friends and Family: More than three times a week    Frequency of Social Gatherings with Friends and Family: More than three times a week    Attends Religious Services: More than 4 times per year    Active Member of [employer] or Organizations: Yes    Attends Engineer, structural: More than 4 times per year    Marital Status: Never married    Tobacco Counseling Counseling given: Not Answered   Clinical Intake:  Pre-visit preparation completed: Yes  Pain : No/denies pain Pain Score: 0-No pain     BMI - recorded: 28.63 Nutritional Status:  BMI 25 -29 Overweight Nutritional Risks: None Diabetes: No  How often do you need to have someone help you when you read instructions, pamphlets, or other written materials from your doctor or pharmacy?: 1 - Never What is the last grade level you completed in school?: College Education  Nutrition Risk Assessment:  Has the patient had any N/V/D within the last 2 months?  No  Does the patient have any non-healing wounds?  No  Has the patient had any unintentional weight loss or weight gain?  No   Diabetes:  Is the patient diabetic?  Yes  If diabetic, was a CBG obtained today?  No  Did the patient bring in their glucometer from home?  No  How often do you monitor your CBG's? No; oral medication only.   Financial Strains and Diabetes Management:  Are you having any financial strains with the device, your supplies or your medication? No .  Does the patient want to be seen by Chronic Care Management for management of their diabetes?  No  Would the patient like to be referred to a Nutritionist or for Diabetic Management?  No   Diabetic Exams:  Diabetic Eye Exam: Completed 04/25/2022 Diabetic Foot Exam: Completed 04/26/2023   Interpreter Needed?: No  Information entered by :: Nataliee Shurtz N. Johnathon Olden, LPN.   Activities of Daily Living    05/08/2023    2:35 PM  In your present state of health, do you have any difficulty performing the following activities:  Hearing? 0  Vision? 0  Difficulty concentrating or making decisions? 0  Walking or climbing stairs? 0  Dressing or bathing? 0  Doing errands, shopping? 0  Preparing Food and eating ? N  Using the Toilet? N  In the past six months, have you accidently leaked urine? N  Do you have problems with loss of bowel control? N  Managing your Medications? N  Managing your Finances? N  Housekeeping or managing your Housekeeping? N  Patient Care Team: Myrlene Broker, MD as PCP - General (Internal Medicine) Runell Gess, MD  as PCP - Cardiology (Cardiology) Maris Berger, MD as Consulting Physician (Ophthalmology) Lucille Passy, MD as Referring Physician (Ophthalmology) Waymon Budge, MD as Consulting Physician (Pulmonary Disease)  Indicate any recent Medical Services you may have received from other than [facility] providers in the past year (date may be approximate).     Assessment:   This is a routine wellness examination for [place].  Hearing/Vision screen Hearing Screening - Comments:: Denies hearing difficulties   Vision Screening - Comments:: Wears rx glasses - up to date with routine eye exams with Dr. Maris Berger and Dr. Arlys John   Dietary issues and exercise activities discussed: Current Exercise Habits: Home exercise routine, Type of exercise: walking;Other - see comments (walk dog 2-3 times per day), Time (Minutes): 30, Frequency (Times/Week): 7, Weekly Exercise (Minutes/Week): 210, Intensity: Moderate, Exercise limited by: respiratory conditions(s);orthopedic condition(s)   Goals Addressed             This Visit's Progress    Patient Stated: My goal for 2024 is to [facility] my health.        Depression Screen    05/08/2023    2:35 PM 05/16/2022    9:09 AM 05/04/2022    9:54 AM 07/04/2021    2:01 PM 05/02/2021    1:12 PM 05/02/2021   12:58 PM 04/28/2020   10:14 AM  PHQ 2/9 Scores  PHQ - 2 Score 0 0 0 0 0 0 0  PHQ- 9 Score 0 0         Fall Risk    05/08/2023    2:35 PM 05/16/2022    9:09 AM 05/04/2022    9:51 AM 07/04/2021    2:00 PM 05/02/2021   12:57 PM  Fall Risk   Falls in the past year? 0 1 1 0 1  Number falls in past yr: 0 0 0  0  Injury with Fall? 0 0 0  1  Risk for fall due to : No Fall Risks  No Fall Risks    Follow up Falls prevention discussed  Falls prevention discussed      FALL RISK PREVENTION PERTAINING TO THE HOME:  Any stairs in or around the home? No  If so, are there any without handrails? No  Home free of loose throw rugs in walkways, pet  beds, electrical cords, etc? Yes  Adequate lighting in your home to reduce risk of falls? Yes   ASSISTIVE DEVICES UTILIZED TO PREVENT FALLS:  Life alert? No  Use of a cane, walker or w/c? No  Grab bars in the bathroom? No  Shower chair or bench in shower? No  Elevated toilet seat or a handicapped toilet? No   TIMED UP AND GO:  Was the test performed? No .   Cognitive Function:      09/04/2016    8:03 AM  Montreal Cognitive Assessment   Visuospatial/ Executive (0/5) 5  Naming (0/3) 3  Attention: Read list of digits (0/2) 2  Attention: Read list of letters (0/1) 1  Attention: Serial 7 subtraction starting at 100 (0/3) 3  Language: Repeat phrase (0/2) 2  Language : Fluency (0/1) 1  Abstraction (0/2) 2  Delayed Recall (0/5) 3  Orientation (0/6) 6  Total 28  Adjusted Score (based on education) 28      05/08/2023    2:44 PM 05/04/2022   10:00 AM  6CIT Screen  What Year? 0 points 0 points  What month? 0 points 0 points  What time? 0 points 0 points  Count back from 20 0 points 0 points  Months in reverse 0 points 0 points  Repeat phrase 0 points 0 points  Total Score 0 points 0 points    Immunizations Immunization History  Administered Date(s) Administered   DT (Pediatric) 06/16/2013   Fluad Quad(high Dose 65+) 09/19/2019, 09/12/2021   Influenza Split 08/19/2011, 09/17/2013, 11/16/2014, 09/15/2015, 09/14/2016   Influenza Whole 09/26/2006, 09/17/2009, 10/06/2010, 09/16/2012   Influenza, High Dose Seasonal PF 10/15/2018   Influenza-Unspecified 09/26/2017, 08/30/2018   Moderna Sars-Covid-2 Vaccination 01/23/2020, 02/19/2020, 10/29/2020, 05/04/2021   Pneumococcal Conjugate-13 10/22/2018   Pneumococcal Polysaccharide-23 10/29/2003, 09/17/2009   Pneumococcal-Unspecified 05/22/2014, 07/06/2014   Td 07/20/2003   Tdap 06/19/2013   Zoster Recombinat (Shingrix) 09/17/2020, 11/19/2020   Zoster, Live 05/30/2011    TDAP status: Up to date  Flu Vaccine status: Up to  date  Pneumococcal vaccine status: Up to date  Covid-19 vaccine status: Completed vaccines  Qualifies for Shingles Vaccine? Yes   Zostavax completed Yes   Shingrix Completed?: Yes  Screening Tests Health Maintenance  Topic Date Due   Hepatitis C Screening  Never done   COVID-19 Vaccine (5 - 2023-24 season) 08/18/2022   HEMOGLOBIN A1C  02/08/2023   OPHTHALMOLOGY EXAM  04/25/2023   Diabetic kidney evaluation - eGFR measurement  05/17/2023   Diabetic kidney evaluation - Urine ACR  05/17/2023   DTaP/Tdap/Td (4 - Td or Tdap) 06/20/2023   INFLUENZA VACCINE  07/19/2023   FOOT EXAM  04/25/2024   Medicare Annual Wellness (AWV)  05/07/2024   Pneumonia Vaccine 60+ Years old  Completed   DEXA SCAN  Completed   Zoster Vaccines- Shingrix  Completed   HPV VACCINES  Aged Out   COLONOSCOPY (Pts 45-63yrs Insurance coverage will need to be confirmed)  Discontinued    Health Maintenance  Health Maintenance Due  Topic Date Due   Hepatitis C Screening  Never done   COVID-19 Vaccine (5 - 2023-24 season) 08/18/2022   HEMOGLOBIN A1C  02/08/2023   OPHTHALMOLOGY EXAM  04/25/2023   Diabetic kidney evaluation - eGFR measurement  05/17/2023   Diabetic kidney evaluation - Urine ACR  05/17/2023    Colorectal cancer screening: No longer required.   Mammogram status: Completed 08/17/2021. Repeat every year  Bone Density status: Completed 04/15/2018. Results reflect: Bone density results: OSTEOPENIA. Repeat every 2-3 years.  Lung Cancer Screening: (Low Dose CT Chest recommended if Age 30-80 years, 30 pack-year currently smoking OR have quit w/in 15years.) does not qualify.   Lung Cancer Screening Referral: No  Additional Screening:  Hepatitis C Screening: does qualify; Completed: no  Vision Screening: Recommended annual ophthalmology exams for early detection of glaucoma and other disorders of the eye. Is the patient up to date with their annual eye exam?  Yes  Who is the provider or what is the  name of the office in which the patient attends annual eye exams? Maris Berger, MD and Arlys John, MD. If pt is not established with a provider, would they like to be referred to a provider to establish care? No .   Dental Screening: Recommended annual dental exams for proper oral hygiene  Community Resource Referral / Chronic Care Management: CRR required this visit?  No   CCM required this visit?  No      Plan:     I have personally reviewed and noted the following in the patient's  chart:   Medical and social history Use of alcohol, tobacco or illicit drugs  Current medications and supplements including opioid prescriptions. Patient is not currently taking opioid prescriptions. Functional ability and status Nutritional status Physical activity Advanced directives List of other physicians Hospitalizations, surgeries, and ER visits in previous 12 months Vitals Screenings to include cognitive, depression, and falls Referrals and appointments  In addition, I have reviewed and discussed with patient certain preventive protocols, quality metrics, and best practice recommendations. A written personalized care plan for preventive services as well as general preventive health recommendations were provided to patient.     Mickeal Needy, LPN   [id number]/61/0960   Nurse Notes:  Normal cognitive status assessed by direct observation via telephone conversation by this Nurse Health Advisor. No abnormalities found.

## 2023-05-10 ENCOUNTER — Ambulatory Visit: Payer: Medicare Other | Admitting: Adult Health

## 2023-05-12 ENCOUNTER — Other Ambulatory Visit: Payer: Self-pay | Admitting: Internal Medicine

## 2023-05-15 ENCOUNTER — Encounter: Payer: Self-pay | Admitting: Adult Health

## 2023-05-15 ENCOUNTER — Ambulatory Visit (INDEPENDENT_AMBULATORY_CARE_PROVIDER_SITE_OTHER): Payer: Medicare Other | Admitting: Adult Health

## 2023-05-15 VITALS — BP 156/66 | HR 60 | Ht 61.5 in | Wt 147.6 lb

## 2023-05-15 DIAGNOSIS — G4733 Obstructive sleep apnea (adult) (pediatric): Secondary | ICD-10-CM | POA: Diagnosis not present

## 2023-05-15 NOTE — Progress Notes (Signed)
PATIENT: Amber Roman DOB: 1946/11/03  REASON FOR VISIT: follow up HISTORY FROM: patient  Chief Complaint  Patient presents with   Follow-up    Rm 4, alone.  cpap follow up.BROUGHT MACHINE, got DL.  She will bring sd card next time.  No concerns.      HISTORY OF PRESENT ILLNESS: Today 05/15/23:  Amber Roman is a 77 y.o. female with a history of OSA on CPAP . Returns today for follow-up.  Reports that the CPAP is working well.  Denies any new issues.  Her download is below.   05/09/22: Amber Roman is a 77 year old female with a history of OSA On CPAP. She returns today for follow-up. Denies any issues. DL is below. /  9/81/19: Amber Roman is a 77 year old female with a history of obstructive sleep apnea on CPAP.  She returns today for follow-up.  Overall she feels that the CPAP works well for her.  She denies any new issues.  Her download is below.  Neuropathy: Patient reports that she does not have any pain in the hands or legs.  Denies any numbness.  She states on the right foot she feels as if she has a "duck" foot.  She states that it feels as if her first 3 toes are connected together.  Other than that she does not have any other symptoms.  No change in her gait or balance.  She states that the symptoms have not progressed over the last 5 years.  She is on Cymbalta prescribed by Dr. Lafayette Dragon     REVIEW OF SYSTEMS: Out of a complete 14 system review of symptoms, the patient complains only of the following symptoms, and all other reviewed systems are negative.   ESS 0  ALLERGIES: Allergies  Allergen Reactions   Sulfonamide Derivatives Other (See Comments)    Childhood allergy     HOME MEDICATIONS: Outpatient Medications Prior to Visit  Medication Sig Dispense Refill   albuterol (PROAIR HFA) 108 (90 Base) MCG/ACT inhaler INHALE 2 PUFFS INTO THE LUNGS EVERY 6 HOURS AS NEEDED FOR WHEEZING OR SHORTNESS OF BREATH 18 g 2   azelastine (OPTIVAR) 0.05 % ophthalmic solution  Place 1 drop into both eyes 2 (two) times daily. 6 mL 3   b complex vitamins capsule Take 1 capsule by mouth daily.     BREO ELLIPTA 100-25 MCG/ACT AEPB      budesonide-formoterol (SYMBICORT) 160-4.5 MCG/ACT inhaler Inhale 2 puffs into the lungs 2 (two) times daily. Via AZ&ME pt assistance 3 each 3   buPROPion (WELLBUTRIN XL) 150 MG 24 hr tablet Take 150 mg by mouth every morning.     diphenoxylate-atropine (LOMOTIL) 2.5-0.025 MG tablet Take 1 tablet by mouth 4 (four) times daily as needed. For diarrhea 30 tablet 0   DULoxetine (CYMBALTA) 60 MG capsule Take 1 capsule by mouth daily.  1   fluticasone (FLONASE) 50 MCG/ACT nasal spray 1-2 puffs each nostril once daily while needed 16 g prn   gabapentin (NEURONTIN) 300 MG capsule TAKE 1 CAPSULE(300 MG) BY MOUTH TWICE DAILY 180 capsule 0   guaiFENesin (MUCINEX) 600 MG 12 hr tablet Take 1,200 mg by mouth 2 (two) times daily as needed. For chest tightness     ipratropium-albuterol (DUONEB) 0.5-2.5 (3) MG/3ML SOLN Take 3 mLs by nebulization every 6 (six) hours as needed. 75 mL 12   irbesartan-hydrochlorothiazide (AVALIDE) 300-12.5 MG tablet Take 1 tablet by mouth daily. Annual appt due in May must see provider  for future refills . 30 tablet 0   meloxicam (MOBIC) 15 MG tablet TAKE 1 TABLET(15 MG) BY MOUTH DAILY 90 tablet 3   metFORMIN (GLUCOPHAGE) 500 MG tablet TAKE 1 TABLET(500 MG) BY MOUTH DAILY WITH BREAKFAST 90 tablet 3   metoprolol succinate (TOPROL-XL) 100 MG 24 hr tablet Take 1 tablet (100 mg total) by mouth daily. Annual appt due in May must see provider for future refills 90 tablet 1   Multiple Vitamin (MULTIVITAMIN) tablet Take 1 tablet by mouth daily.     Multiple Vitamins-Minerals (PRESERVISION AREDS PO) Take by mouth daily.     Multiple Vitamins-Minerals (VITAMIN D3 COMPLETE PO) Take 50 mcg by mouth daily.     Nebulizers (COMPRESSOR NEBULIZER) MISC As directed 1 each 0   rosuvastatin (CRESTOR) 20 MG tablet TAKE 1 TABLET(20 MG) BY MOUTH DAILY  Annual appt due in May must see provider for future refills 30 tablet 0   traZODone (DESYREL) 50 MG tablet TAKE 1 TABLET(50 MG) BY MOUTH AT BEDTIME AS NEEDED FOR SLEEP 30 tablet 5   No facility-administered medications prior to visit.    PAST MEDICAL HISTORY: Past Medical History:  Diagnosis Date   Allergic conjunctivitis    Arthritis    Basal cell carcinoma 10/29/1997   ledt nasal ala (MOHS)   Bronchitis    Bruxism, sleep-related 04/13/2014   Depression    Esophageal reflux    Family history of heart disease    H/O abdominal hysterectomy    w/o recurrence   Hyperlipidemia    Hypertension    Insomnia 04/13/2014   Obstructive sleep apnea    improved on C Pap   Squamous cell carcinoma of skin 09/20/2015   left upper arm (Txpbx)   Uterine cancer (HCC)    hysterectomy    PAST SURGICAL HISTORY: Past Surgical History:  Procedure Laterality Date   BILATERAL SALPINGOOPHORECTOMY     CARPAL TUNNEL RELEASE     bilateral   DOPPLER ECHOCARDIOGRAPHY     NASAL SEPTUM SURGERY     NM MYOVIEW LTD     ROTATOR CUFF REPAIR Right    TOTAL ABDOMINAL HYSTERECTOMY     TOTAL HIP ARTHROPLASTY  01/10/2013   Procedure: TOTAL HIP ARTHROPLASTY ANTERIOR APPROACH;  Surgeon: Kathryne Hitch, MD;  Location: [facility];  Service: Orthopedics;  Laterality: Right;    FAMILY HISTORY: Family History  Problem Relation Age of Onset   Lung disease Mother    Heart attack Father    High blood pressure Father    Multiple sclerosis Father    Diabetes Mellitus II Sister    High blood pressure Sister    Colon cancer Brother    High blood pressure Brother    Sleep apnea Neg Hx     SOCIAL HISTORY: Social History   Socioeconomic History   Marital status: Single    Spouse name: Not on file   Number of children: 1   Years of education: College   Highest education level: Not on file  Occupational History   Not on file  Tobacco Use   Smoking status: Former    Packs/day: 2.00    Years: 15.00     Additional pack years: 0.00    Total pack years: 30.00    Types: Cigarettes    Quit date: 12/18/1978    Years since quitting: 44.4   Smokeless tobacco: Never  Vaping Use   Vaping Use: Never used  Substance and Sexual Activity   Alcohol use: No  Drug use: No    Comment: hx of marijuana use    Sexual activity: Not on file  Other Topics Concern   Not on file  Social History Narrative   Patient is single.   Patient has a college education.   Patient drinks one caffeine drink per day.   Patient is right-handed.   Patient has one child.         Social Determinants of Health   Financial Resource Strain: Low Risk  (05/08/2023)   Overall Financial Resource Strain (CARDIA)    Difficulty of Paying Living Expenses: Not hard at all  Food Insecurity: No Food Insecurity (05/08/2023)   Hunger Vital Sign    Worried About Running Out of Food in the Last Year: Never true    Ran Out of Food in the Last Year: Never true  Transportation Needs: No Transportation Needs (05/08/2023)   PRAPARE - Administrator, Civil Service (Medical): No    Lack of Transportation (Non-Medical): No  Physical Activity: Sufficiently Active (05/08/2023)   Exercise Vital Sign    Days of Exercise per Week: 7 days    Minutes of Exercise per Session: 30 min  Stress: No Stress Concern Present (05/08/2023)   [employer] of Occupational Health - Occupational Stress Questionnaire    Feeling of Stress : Not at all  Social Connections: Moderately Integrated (05/08/2023)   Social Connection and Isolation Panel [NHANES]    Frequency of Communication with Friends and Family: More than three times a week    Frequency of Social Gatherings with Friends and Family: More than three times a week    Attends Religious Services: More than 4 times per year    Active Member of [employer] or Organizations: Yes    Attends Banker Meetings: More than 4 times per year    Marital Status: Never married  Intimate Partner  Violence: Not At Risk (05/08/2023)   Humiliation, Afraid, Rape, and Kick questionnaire    Fear of Current or Ex-Partner: No    Emotionally Abused: No    Physically Abused: No    Sexually Abused: No      PHYSICAL EXAM  Vitals:   05/15/23 1100  BP: (!) 156/66  Pulse: 60  Weight: 147 lb 9.6 oz (67 kg)  Height: 5' 1.5" (1.562 m)   Body mass index is 27.44 kg/m.  Generalized: Well developed, in no acute distress  Chest: Lungs clear to auscultation bilaterally  Neurological examination  Mentation: Alert oriented to time, place, history taking. Follows all commands speech and language fluent Cranial nerve II-XII: Facial Symmetry noted   DIAGNOSTIC DATA (LABS, IMAGING, TESTING) - I reviewed patient records, labs, notes, testing and imaging myself where available.  Lab Results  Component Value Date   WBC 8.3 05/16/2022   HGB 12.8 05/16/2022   HCT 38.6 05/16/2022   MCV 82.2 05/16/2022   PLT 229.0 05/16/2022      Component Value Date/Time   NA 141 05/16/2022 0931   NA 141 09/19/2017 0000   K 4.5 05/16/2022 0931   CL 103 05/16/2022 0931   CO2 29 05/16/2022 0931   GLUCOSE 118 (H) 05/16/2022 0931   BUN 18 05/16/2022 0931   BUN 22 (A) 09/19/2017 0000   CREATININE 1.04 05/16/2022 0931   CALCIUM 9.9 05/16/2022 0931   PROT 7.0 05/16/2022 0931   PROT 7.1 07/22/2019 1158   ALBUMIN 4.7 05/16/2022 0931   ALBUMIN 5.0 (H) 07/22/2019 1158   AST 15 05/16/2022 0931  ALT 16 05/16/2022 0931   ALKPHOS 79 05/16/2022 0931   BILITOT 0.7 05/16/2022 0931   BILITOT 0.3 07/22/2019 1158   GFRNONAA >90 01/11/2013 0444   GFRAA >90 01/11/2013 0444   Lab Results  Component Value Date   CHOL 140 05/16/2022   HDL 42.20 05/16/2022   LDLCALC 60 05/16/2022   LDLDIRECT 84.0 05/02/2021   TRIG 193.0 (H) 05/16/2022   CHOLHDL 3 05/16/2022   Lab Results  Component Value Date   HGBA1C 6.2 (A) 08/08/2022   Lab Results  Component Value Date   VITAMINB12 1,444 (H) 05/16/2022   Lab Results   Component Value Date   TSH 3.47 05/16/2022      ASSESSMENT AND PLAN 77 y.o. year old female  has a past medical history of Allergic conjunctivitis, Arthritis, Basal cell carcinoma (10/29/1997), Bronchitis, Bruxism, sleep-related (04/13/2014), Depression, Esophageal reflux, Family history of heart disease, H/O abdominal hysterectomy, Hyperlipidemia, Hypertension, Insomnia (04/13/2014), Obstructive sleep apnea, Squamous cell carcinoma of skin (09/20/2015), and Uterine cancer (HCC). here with:  OSA on CPAP  - CPAP compliance excellent - Good treatment of AHI  - Encourage patient to use CPAP nightly and > 4 hours each night - F/U in 1 year or sooner if needed     Butch Penny, MSN, NP-C 05/15/2023, 11:13 AM [facility] [address], [address] [place], [place] [address] [phone]

## 2023-05-16 DIAGNOSIS — Z1231 Encounter for screening mammogram for malignant neoplasm of breast: Secondary | ICD-10-CM | POA: Diagnosis not present

## 2023-05-16 LAB — HM MAMMOGRAPHY

## 2023-05-18 ENCOUNTER — Ambulatory Visit (INDEPENDENT_AMBULATORY_CARE_PROVIDER_SITE_OTHER): Payer: Medicare Other | Admitting: Internal Medicine

## 2023-05-18 ENCOUNTER — Encounter: Payer: Self-pay | Admitting: Internal Medicine

## 2023-05-18 VITALS — BP 120/80 | HR 67 | Temp 98.4°F | Ht 61.5 in | Wt 149.0 lb

## 2023-05-18 DIAGNOSIS — G4733 Obstructive sleep apnea (adult) (pediatric): Secondary | ICD-10-CM

## 2023-05-18 DIAGNOSIS — I700 Atherosclerosis of aorta: Secondary | ICD-10-CM

## 2023-05-18 DIAGNOSIS — E1169 Type 2 diabetes mellitus with other specified complication: Secondary | ICD-10-CM | POA: Diagnosis not present

## 2023-05-18 DIAGNOSIS — N281 Cyst of kidney, acquired: Secondary | ICD-10-CM | POA: Diagnosis not present

## 2023-05-18 DIAGNOSIS — Z7984 Long term (current) use of oral hypoglycemic drugs: Secondary | ICD-10-CM | POA: Diagnosis not present

## 2023-05-18 DIAGNOSIS — E785 Hyperlipidemia, unspecified: Secondary | ICD-10-CM | POA: Diagnosis not present

## 2023-05-18 DIAGNOSIS — I10 Essential (primary) hypertension: Secondary | ICD-10-CM

## 2023-05-18 LAB — CBC
HCT: 38.1 % (ref 36.0–46.0)
Hemoglobin: 12.2 g/dL (ref 12.0–15.0)
MCHC: 32 g/dL (ref 30.0–36.0)
MCV: 83.2 fl (ref 78.0–100.0)
Platelets: 213 10*3/uL (ref 150.0–400.0)
RBC: 4.59 Mil/uL (ref 3.87–5.11)
RDW: 17.5 % — ABNORMAL HIGH (ref 11.5–15.5)
WBC: 6.7 10*3/uL (ref 4.0–10.5)

## 2023-05-18 LAB — LIPID PANEL
Cholesterol: 145 mg/dL (ref 0–200)
HDL: 49.9 mg/dL (ref >39.00)
NonHDL: 95.27
Total CHOL/HDL Ratio: 3
Triglycerides: 262 mg/dL — ABNORMAL HIGH (ref 0.0–149.0)
VLDL: 52.4 mg/dL — ABNORMAL HIGH (ref 0.0–40.0)

## 2023-05-18 LAB — COMPREHENSIVE METABOLIC PANEL
ALT: 22 U/L (ref 0–35)
AST: 18 U/L (ref 0–37)
Albumin: 4.1 g/dL (ref 3.5–5.2)
Alkaline Phosphatase: 71 U/L (ref 39–117)
BUN: 22 mg/dL (ref 6–23)
CO2: 33 mEq/L — ABNORMAL HIGH (ref 19–32)
Calcium: 9.4 mg/dL (ref 8.4–10.5)
Chloride: 104 mEq/L (ref 96–112)
Creatinine, Ser: 1 mg/dL (ref 0.40–1.20)
GFR: 53.87 mL/min — ABNORMAL LOW (ref >60.00)
Glucose, Bld: 141 mg/dL — ABNORMAL HIGH (ref 70–99)
Potassium: 5 mEq/L (ref 3.5–5.1)
Sodium: 141 mEq/L (ref 135–145)
Total Bilirubin: 0.4 mg/dL (ref 0.2–1.2)
Total Protein: 6.7 g/dL (ref 6.0–8.3)

## 2023-05-18 LAB — MICROALBUMIN / CREATININE URINE RATIO
Creatinine,U: 44.1 mg/dL
Microalb Creat Ratio: 1.6 mg/g (ref 0.0–30.0)
Microalb, Ur: <0.7 mg/dL (ref 0.0–1.9)

## 2023-05-18 LAB — LDL CHOLESTEROL, DIRECT: Direct LDL: 69 mg/dL

## 2023-05-18 LAB — HEMOGLOBIN A1C: Hgb A1c MFr Bld: 7 % — ABNORMAL HIGH (ref 4.6–6.5)

## 2023-05-18 MED ORDER — MELOXICAM 15 MG PO TABS: ORAL_TABLET | ORAL | 3 refills | Status: DC

## 2023-05-18 MED ORDER — METFORMIN HCL 500 MG PO TABS: 500.0000 mg | ORAL_TABLET | Freq: Every day | ORAL | 3 refills | Status: DC

## 2023-05-18 MED ORDER — ROSUVASTATIN CALCIUM 20 MG PO TABS: 20.0000 mg | ORAL_TABLET | Freq: Every day | ORAL | 3 refills | Status: DC

## 2023-05-18 MED ORDER — METOPROLOL SUCCINATE ER 100 MG PO TB24: 100.0000 mg | ORAL_TABLET | Freq: Every day | ORAL | 3 refills | Status: DC

## 2023-05-18 MED ORDER — GABAPENTIN 300 MG PO CAPS: 300.0000 mg | ORAL_CAPSULE | Freq: Two times a day (BID) | ORAL | 3 refills | Status: DC

## 2023-05-18 MED ORDER — IRBESARTAN-HYDROCHLOROTHIAZIDE 300-12.5 MG PO TABS: 1.0000 | ORAL_TABLET | Freq: Every day | ORAL | 3 refills | Status: DC

## 2023-05-18 NOTE — Assessment & Plan Note (Signed)
Checking lipid panel and adjust as needed crestor 20 mg daily for LDL goal <100.

## 2023-05-18 NOTE — Assessment & Plan Note (Signed)
Ordered renal US as recommended by MRI lumbar. Counseled patient this may be adequate imaging and the cyst may or may not need further follow up.

## 2023-05-18 NOTE — Assessment & Plan Note (Signed)
Taking crestor 20 mg daily and will continue.

## 2023-05-18 NOTE — Progress Notes (Signed)
   Subjective:   Patient ID: Amber Roman, female    DOB: 09-Jul-1946, 77 y.o.   MRN: [id number]  HPI The patient is a 77 YO female coming in for follow up and concerns about renal cyst on back imaging.   Review of Systems  Constitutional: Negative.   HENT: Negative.    Eyes: Negative.   Respiratory:  Negative for cough, chest tightness and shortness of breath.   Cardiovascular:  Negative for chest pain, palpitations and leg swelling.  Gastrointestinal:  Negative for abdominal distention, abdominal pain, constipation, diarrhea, nausea and vomiting.  Musculoskeletal: Negative.   Skin: Negative.   Neurological: Negative.   Psychiatric/Behavioral: Negative.      Objective:  Physical Exam Constitutional:      Appearance: She is well-developed.  HENT:     Head: Normocephalic and atraumatic.  Cardiovascular:     Rate and Rhythm: Normal rate and regular rhythm.  Pulmonary:     Effort: Pulmonary effort is normal. No respiratory distress.     Breath sounds: Normal breath sounds. No wheezing or rales.  Abdominal:     General: Bowel sounds are normal. There is no distension.     Palpations: Abdomen is soft.     Tenderness: There is no abdominal tenderness. There is no rebound.  Musculoskeletal:     Cervical back: Normal range of motion.  Skin:    General: Skin is warm and dry.  Neurological:     Mental Status: She is alert and oriented to person, place, and time.     Coordination: Coordination normal.     Vitals:   05/18/23 1341  BP: 120/80  Pulse: 67  Temp: 98.4 F (36.9 C)  TempSrc: Oral  SpO2: 98%  Weight: 149 lb (67.6 kg)  Height: 5' 1.5" (1.562 m)    Assessment & Plan:

## 2023-05-18 NOTE — Assessment & Plan Note (Signed)
Checking HgA1c, lipid panel CMP and microalbumin to creatinine ratio. Adjust her metformin 500 mg daily as needed. On ARB and statin. Eye exam up to date.

## 2023-05-18 NOTE — Assessment & Plan Note (Signed)
Continue CPAP and still getting benefit from usage. Uses every day at least 4 hours.

## 2023-05-18 NOTE — Patient Instructions (Addendum)
We will check the labs and the ultrasound.

## 2023-05-18 NOTE — Assessment & Plan Note (Signed)
BP at goal on irbesartan 300/12.5 mg daily and metoprolol 100 mg daily. Checking CMP and adjust as needed.

## 2023-05-21 ENCOUNTER — Telehealth: Payer: Self-pay | Admitting: Internal Medicine

## 2023-05-21 NOTE — Telephone Encounter (Signed)
Patient would like lab results from 05/18/2023 mailed to the address on file. Address was confirmed. Best callback is [phone].

## 2023-05-21 NOTE — Telephone Encounter (Signed)
Printed and placed inside the [employer]

## 2023-05-22 ENCOUNTER — Ambulatory Visit: Payer: Medicare Other | Admitting: Physician Assistant

## 2023-05-23 ENCOUNTER — Encounter: Payer: Self-pay | Admitting: Internal Medicine

## 2023-05-24 DIAGNOSIS — M5416 Radiculopathy, lumbar region: Secondary | ICD-10-CM | POA: Diagnosis not present

## 2023-06-14 DIAGNOSIS — D3131 Benign neoplasm of right choroid: Secondary | ICD-10-CM | POA: Diagnosis not present

## 2023-06-14 DIAGNOSIS — H35033 Hypertensive retinopathy, bilateral: Secondary | ICD-10-CM | POA: Diagnosis not present

## 2023-06-14 DIAGNOSIS — H353222 Exudative age-related macular degeneration, left eye, with inactive choroidal neovascularization: Secondary | ICD-10-CM | POA: Diagnosis not present

## 2023-06-14 DIAGNOSIS — H43813 Vitreous degeneration, bilateral: Secondary | ICD-10-CM | POA: Diagnosis not present

## 2023-06-14 DIAGNOSIS — H43393 Other vitreous opacities, bilateral: Secondary | ICD-10-CM | POA: Diagnosis not present

## 2023-06-14 DIAGNOSIS — H353211 Exudative age-related macular degeneration, right eye, with active choroidal neovascularization: Secondary | ICD-10-CM | POA: Diagnosis not present

## 2023-06-15 ENCOUNTER — Ambulatory Visit
Admission: RE | Admit: 2023-06-15 | Discharge: 2023-06-15 | Disposition: A | Discharge status: Home / Self Care | Payer: Medicare Other | Source: Ambulatory Visit | Attending: Internal Medicine | Admitting: Internal Medicine

## 2023-06-15 DIAGNOSIS — N281 Cyst of kidney, acquired: Secondary | ICD-10-CM

## 2023-06-26 DIAGNOSIS — E1169 Type 2 diabetes mellitus with other specified complication: Secondary | ICD-10-CM | POA: Diagnosis not present

## 2023-06-26 DIAGNOSIS — E785 Hyperlipidemia, unspecified: Secondary | ICD-10-CM | POA: Diagnosis not present

## 2023-06-26 DIAGNOSIS — I10 Essential (primary) hypertension: Secondary | ICD-10-CM | POA: Diagnosis not present

## 2023-06-26 DIAGNOSIS — F32A Depression, unspecified: Secondary | ICD-10-CM | POA: Diagnosis not present

## 2023-06-26 DIAGNOSIS — G629 Polyneuropathy, unspecified: Secondary | ICD-10-CM | POA: Diagnosis not present

## 2023-07-24 DIAGNOSIS — Z7189 Other specified counseling: Secondary | ICD-10-CM | POA: Diagnosis not present

## 2023-07-24 DIAGNOSIS — L821 Other seborrheic keratosis: Secondary | ICD-10-CM | POA: Diagnosis not present

## 2023-07-24 DIAGNOSIS — D225 Melanocytic nevi of trunk: Secondary | ICD-10-CM | POA: Diagnosis not present

## 2023-07-30 DIAGNOSIS — H43393 Other vitreous opacities, bilateral: Secondary | ICD-10-CM | POA: Diagnosis not present

## 2023-07-30 DIAGNOSIS — H35033 Hypertensive retinopathy, bilateral: Secondary | ICD-10-CM | POA: Diagnosis not present

## 2023-07-30 DIAGNOSIS — D3131 Benign neoplasm of right choroid: Secondary | ICD-10-CM | POA: Diagnosis not present

## 2023-07-30 DIAGNOSIS — H43813 Vitreous degeneration, bilateral: Secondary | ICD-10-CM | POA: Diagnosis not present

## 2023-07-30 DIAGNOSIS — H353222 Exudative age-related macular degeneration, left eye, with inactive choroidal neovascularization: Secondary | ICD-10-CM | POA: Diagnosis not present

## 2023-07-30 DIAGNOSIS — H353211 Exudative age-related macular degeneration, right eye, with active choroidal neovascularization: Secondary | ICD-10-CM | POA: Diagnosis not present

## 2023-07-31 ENCOUNTER — Ambulatory Visit (INDEPENDENT_AMBULATORY_CARE_PROVIDER_SITE_OTHER): Payer: Medicare Other | Admitting: Podiatry

## 2023-07-31 DIAGNOSIS — M79675 Pain in left toe(s): Secondary | ICD-10-CM

## 2023-07-31 DIAGNOSIS — B351 Tinea unguium: Secondary | ICD-10-CM | POA: Diagnosis not present

## 2023-07-31 DIAGNOSIS — M79674 Pain in right toe(s): Secondary | ICD-10-CM

## 2023-07-31 DIAGNOSIS — E1169 Type 2 diabetes mellitus with other specified complication: Secondary | ICD-10-CM

## 2023-07-31 NOTE — Progress Notes (Signed)
  Subjective:  Patient ID: Amber Roman, female    DOB: Feb 25, 1946,  MRN: [id number]  Chief Complaint  Patient presents with   Nail Problem    Dfc, pt denies pain    77 y.o. female presents with the above complaint. History confirmed with patient.  She is doing well the nails have thickened and elongated again, previous debridements have been helpful in reducing pain and improving function.  Blood sugar remains well-controlled.  Objective:  Physical Exam: warm, good capillary refill, no trophic changes or ulcerative lesions, normal DP and PT pulses, and normal sensory exam. Left Foot: dystrophic yellowed discolored nail plates with subungual debris Right Foot: dystrophic yellowed discolored nail plates with subungual debris  Assessment:   1. Pain due to onychomycosis of toenails of both feet   2. Type 2 diabetes mellitus with other specified complication, without long-term current use of insulin (HCC)      Plan:  Patient was evaluated and treated and all questions answered.   Discussed the etiology and treatment options for the condition in detail with the patient. Recommended debridement of the nails today. Sharp and mechanical debridement performed of all painful and mycotic nails today. Nails debrided in length and thickness using a nail nipper to level of comfort. Discussed treatment options including appropriate shoe gear. Follow up as needed for painful nails.   Return in about 3 months (around 10/31/2023) for at risk diabetic foot care.

## 2023-09-11 DIAGNOSIS — Z23 Encounter for immunization: Secondary | ICD-10-CM | POA: Diagnosis not present

## 2023-09-24 DIAGNOSIS — H353222 Exudative age-related macular degeneration, left eye, with inactive choroidal neovascularization: Secondary | ICD-10-CM | POA: Diagnosis not present

## 2023-09-24 DIAGNOSIS — H43813 Vitreous degeneration, bilateral: Secondary | ICD-10-CM | POA: Diagnosis not present

## 2023-09-24 DIAGNOSIS — H353211 Exudative age-related macular degeneration, right eye, with active choroidal neovascularization: Secondary | ICD-10-CM | POA: Diagnosis not present

## 2023-09-24 DIAGNOSIS — H43393 Other vitreous opacities, bilateral: Secondary | ICD-10-CM | POA: Diagnosis not present

## 2023-09-24 DIAGNOSIS — D3131 Benign neoplasm of right choroid: Secondary | ICD-10-CM | POA: Diagnosis not present

## 2023-09-24 DIAGNOSIS — H35033 Hypertensive retinopathy, bilateral: Secondary | ICD-10-CM | POA: Diagnosis not present

## 2023-09-24 LAB — HM DIABETES EYE EXAM

## 2023-09-25 ENCOUNTER — Encounter: Payer: Self-pay | Admitting: Internal Medicine

## 2023-09-25 ENCOUNTER — Encounter: Payer: Self-pay | Admitting: Cardiovascular Disease

## 2023-09-25 ENCOUNTER — Ambulatory Visit: Payer: Medicare Other | Attending: Cardiovascular Disease | Admitting: Cardiovascular Disease

## 2023-09-25 VITALS — BP 128/64 | HR 67 | Ht 61.0 in | Wt 149.8 lb

## 2023-09-25 DIAGNOSIS — E785 Hyperlipidemia, unspecified: Secondary | ICD-10-CM | POA: Insufficient documentation

## 2023-09-25 DIAGNOSIS — E1169 Type 2 diabetes mellitus with other specified complication: Secondary | ICD-10-CM | POA: Diagnosis not present

## 2023-09-25 DIAGNOSIS — I10 Essential (primary) hypertension: Secondary | ICD-10-CM | POA: Insufficient documentation

## 2023-09-25 DIAGNOSIS — E782 Mixed hyperlipidemia: Secondary | ICD-10-CM | POA: Diagnosis not present

## 2023-09-25 DIAGNOSIS — G4733 Obstructive sleep apnea (adult) (pediatric): Secondary | ICD-10-CM | POA: Insufficient documentation

## 2023-09-25 NOTE — Assessment & Plan Note (Signed)
History of hyperlipidemia on statin therapy with lipid profile performed 05/08/2023 revealing a total cholesterol 145, LDL 60 and HDL of 50.

## 2023-09-25 NOTE — Assessment & Plan Note (Signed)
History of essential pressure measured today at 128/64.  She is on metoprolol.

## 2023-09-25 NOTE — Assessment & Plan Note (Signed)
History of obstructive sleep apnea on CPAP. 

## 2023-09-25 NOTE — Progress Notes (Signed)
09/25/2023 Amber Roman   [birth year]  [id number]  Primary Physician Myrlene Broker, MD Primary Cardiologist: Runell Gess MD Nicholes Calamity, [place]  HPI:  Amber Roman is a 77 y.o.    fit-appearing, divorced Caucasian female, mother of a 33 year old [place] adopted daughter named Samara Deist who she adopted when she was 77 years old and has graduated from [employer]. She currently lives in [place] and owns an indoor plant shop... I last saw her in the office 05/06/2022.  She has a history of hypertension, hyperlipidemia, and a strong family history for heart disease with a father who had his first MI at age 24 and died 20 years later of a massive myocardial infarction. Her last Myoview stress test performed 10/25/12 was not ischemic.She has never had a heart attack or stroke, otherwise is asymptomatic. Her other problems include uterine cancer having undergone a total abdominal hysterectomy without recurrence 12 years ago. Dr. Felipa Eth follows her lipid profile. She had an uncomplicated total hip replacement by Dr. Allie Bossier 01/09/2013. Since I saw her back she denies chest pain but does complain of occasional shortness of breath on exertion. She had negative breathing studies by Dr. Maple Hudson recently.  She was diagnosed obstructive sleep apnea by Dr. Lahoma Rocker and has been a remarkable responder to CPAP.   Since I saw her in the office 1 year ago she has remained stable.   She was also diagnosed with type 2 diabetes and was placed on metformin.  She denies chest pain or shortness of breath.   Current Meds  Medication Sig   albuterol (PROAIR HFA) 108 (90 Base) MCG/ACT inhaler INHALE 2 PUFFS INTO THE LUNGS EVERY 6 HOURS AS NEEDED FOR WHEEZING OR SHORTNESS OF BREATH   azelastine (OPTIVAR) 0.05 % ophthalmic solution Place 1 drop into both eyes 2 (two) times daily.   b complex vitamins capsule Take 1 capsule by mouth daily.   budesonide-formoterol (SYMBICORT) 160-4.5 MCG/ACT inhaler Inhale 2  puffs into the lungs 2 (two) times daily. Via AZ&ME pt assistance   diphenoxylate-atropine (LOMOTIL) 2.5-0.025 MG tablet Take 1 tablet by mouth 4 (four) times daily as needed. For diarrhea   DULoxetine (CYMBALTA) 60 MG capsule Take 1 capsule by mouth daily.   fluticasone (FLONASE) 50 MCG/ACT nasal spray 1-2 puffs each nostril once daily while needed   gabapentin (NEURONTIN) 300 MG capsule Take 1 capsule (300 mg total) by mouth in the morning and at bedtime.   guaiFENesin (MUCINEX) 600 MG 12 hr tablet Take 1,200 mg by mouth 2 (two) times daily as needed. For chest tightness   ipratropium-albuterol (DUONEB) 0.5-2.5 (3) MG/3ML SOLN Take 3 mLs by nebulization every 6 (six) hours as needed.   irbesartan-hydrochlorothiazide (AVALIDE) 300-12.5 MG tablet Take 1 tablet by mouth daily.   meloxicam (MOBIC) 15 MG tablet TAKE 1 TABLET(15 MG) BY MOUTH DAILY   metFORMIN (GLUCOPHAGE) 500 MG tablet Take 1 tablet (500 mg total) by mouth daily with breakfast.   metoprolol succinate (TOPROL-XL) 100 MG 24 hr tablet Take 1 tablet (100 mg total) by mouth daily.   Multiple Vitamin (MULTIVITAMIN) tablet Take 1 tablet by mouth daily.   Multiple Vitamins-Minerals (PRESERVISION AREDS PO) Take by mouth daily.   Multiple Vitamins-Minerals (VITAMIN D3 COMPLETE PO) Take 50 mcg by mouth daily.   Nebulizers (COMPRESSOR NEBULIZER) MISC As directed   rosuvastatin (CRESTOR) 20 MG tablet Take 1 tablet (20 mg total) by mouth daily.     Allergies  Allergen Reactions  Sulfonamide Derivatives Other (See Comments)    Childhood allergy     Social History   Socioeconomic History   Marital status: Single    Spouse name: Not on file   Number of children: 1   Years of education: College   Highest education level: Not on file  Occupational History   Not on file  Tobacco Use   Smoking status: Former    Current packs/day: 0.00    Average packs/day: 2.0 packs/day for 15.0 years (30.0 ttl pk-yrs)    Types: Cigarettes    Start  date: 12/19/1963    Quit date: 12/18/1978    Years since quitting: 44.8   Smokeless tobacco: Never  Vaping Use   Vaping status: Never Used  Substance and Sexual Activity   Alcohol use: No   Drug use: No    Comment: hx of marijuana use    Sexual activity: Not on file  Other Topics Concern   Not on file  Social History Narrative   Patient is single.   Patient has a college education.   Patient drinks one caffeine drink per day.   Patient is right-handed.   Patient has one child.         Social Determinants of Health   Financial Resource Strain: Low Risk  (05/08/2023)   Overall Financial Resource Strain (CARDIA)    Difficulty of Paying Living Expenses: Not hard at all  Food Insecurity: No Food Insecurity (05/08/2023)   Hunger Vital Sign    Worried About Running Out of Food in the Last Year: Never true    Ran Out of Food in the Last Year: Never true  Transportation Needs: No Transportation Needs (05/08/2023)   PRAPARE - Administrator, Civil Service (Medical): No    Lack of Transportation (Non-Medical): No  Physical Activity: Sufficiently Active (05/08/2023)   Exercise Vital Sign    Days of Exercise per Week: 7 days    Minutes of Exercise per Session: 30 min  Stress: No Stress Concern Present (05/08/2023)   [employer] of Occupational Health - Occupational Stress Questionnaire    Feeling of Stress : Not at all  Social Connections: Moderately Integrated (05/08/2023)   Social Connection and Isolation Panel [NHANES]    Frequency of Communication with Friends and Family: More than three times a week    Frequency of Social Gatherings with Friends and Family: More than three times a week    Attends Religious Services: More than 4 times per year    Active Member of [employer] or Organizations: Yes    Attends Banker Meetings: More than 4 times per year    Marital Status: Never married  Intimate Partner Violence: Not At Risk (05/08/2023)   Humiliation, Afraid,  Rape, and Kick questionnaire    Fear of Current or Ex-Partner: No    Emotionally Abused: No    Physically Abused: No    Sexually Abused: No     Review of Systems: General: negative for chills, fever, night sweats or weight changes.  Cardiovascular: negative for chest pain, dyspnea on exertion, edema, orthopnea, palpitations, paroxysmal nocturnal dyspnea or shortness of breath Dermatological: negative for rash Respiratory: negative for cough or wheezing Urologic: negative for hematuria Abdominal: negative for nausea, vomiting, diarrhea, bright red blood per rectum, melena, or hematemesis Neurologic: negative for visual changes, syncope, or dizziness All other systems reviewed and are otherwise negative except as noted above.    Blood pressure 128/64, pulse 67, height 5\' 1"  (1.549  m), weight 149 lb 12.8 oz (67.9 kg), SpO2 96%.  General appearance: alert and no distress Neck: no adenopathy, no carotid bruit, no JVD, supple, symmetrical, trachea midline, and thyroid not enlarged, symmetric, no tenderness/mass/nodules Lungs: clear to auscultation bilaterally Heart: regular rate and rhythm, S1, S2 normal, no murmur, click, rub or gallop Extremities: extremities normal, atraumatic, no cyanosis or edema Pulses: 2+ and symmetric Skin: Skin color, texture, turgor normal. No rashes or lesions Neurologic: Grossly normal  EKG EKG Interpretation Date/Time:  Tuesday September 25 2023 14:25:42 EDT Ventricular Rate:  67 PR Interval:  180 QRS Duration:  92 QT Interval:  398 QTC Calculation: 420 R Axis:   79  Text Interpretation: Normal sinus rhythm Anteroseptal infarct , age undetermined No previous ECGs available Confirmed by Nanetta Batty [phone]) on 09/25/2023 2:32:27 PM    ASSESSMENT AND PLAN:   Hyperlipidemia associated with type 2 diabetes mellitus (HCC) History of hyperlipidemia on statin therapy with lipid profile performed 05/08/2023 revealing a total cholesterol 145, LDL 60 and HDL  of 50.  Essential hypertension History of essential pressure measured today at 128/64.  She is on metoprolol.  OSA on CPAP History of obstructive sleep apnea on CPAP     Runell Gess MD [facility], [facility] 09/25/2023 2:41 PM

## 2023-09-25 NOTE — Patient Instructions (Signed)
Medication Instructions:  Your physician recommends that you continue on your current medications as directed. Please refer to the Current Medication list given to you today.  *If you need a refill on your cardiac medications before your next appointment, please call your pharmacy*   Lab Work: None  Testing/Procedures: None   Follow-Up: At [facility], you and your health needs are our priority.  As part of our continuing mission to provide you with exceptional heart care, we have created designated Provider Care Teams.  These Care Teams include your primary Cardiologist (physician) and Advanced Practice Providers (APPs -  Physician Assistants and Nurse Practitioners) who all work together to provide you with the care you need, when you need it.   Your next appointment:   12 month(s)  Provider:   Nanetta Batty, MD

## 2023-10-08 DIAGNOSIS — Z23 Encounter for immunization: Secondary | ICD-10-CM | POA: Diagnosis not present

## 2023-11-19 ENCOUNTER — Encounter: Payer: Self-pay | Admitting: Podiatry

## 2023-11-19 ENCOUNTER — Ambulatory Visit (INDEPENDENT_AMBULATORY_CARE_PROVIDER_SITE_OTHER): Payer: Medicare Other | Admitting: Podiatry

## 2023-11-19 DIAGNOSIS — E1169 Type 2 diabetes mellitus with other specified complication: Secondary | ICD-10-CM

## 2023-11-19 DIAGNOSIS — D3131 Benign neoplasm of right choroid: Secondary | ICD-10-CM | POA: Diagnosis not present

## 2023-11-19 DIAGNOSIS — B351 Tinea unguium: Secondary | ICD-10-CM

## 2023-11-19 DIAGNOSIS — H43813 Vitreous degeneration, bilateral: Secondary | ICD-10-CM | POA: Diagnosis not present

## 2023-11-19 DIAGNOSIS — H43393 Other vitreous opacities, bilateral: Secondary | ICD-10-CM | POA: Diagnosis not present

## 2023-11-19 DIAGNOSIS — H353211 Exudative age-related macular degeneration, right eye, with active choroidal neovascularization: Secondary | ICD-10-CM | POA: Diagnosis not present

## 2023-11-19 DIAGNOSIS — H353222 Exudative age-related macular degeneration, left eye, with inactive choroidal neovascularization: Secondary | ICD-10-CM | POA: Diagnosis not present

## 2023-11-19 DIAGNOSIS — M79675 Pain in left toe(s): Secondary | ICD-10-CM | POA: Diagnosis not present

## 2023-11-19 DIAGNOSIS — M79674 Pain in right toe(s): Secondary | ICD-10-CM

## 2023-11-19 DIAGNOSIS — H35033 Hypertensive retinopathy, bilateral: Secondary | ICD-10-CM | POA: Diagnosis not present

## 2023-11-19 NOTE — Progress Notes (Signed)
  Subjective:  Patient ID: Amber Roman, female    DOB: 08-Oct-1946,  MRN: [id number]  Chief Complaint  Patient presents with   Diabetes    "Trim my toenails."    77 y.o. female presents with the above complaint. History confirmed with patient.  She is doing well the nails have thickened and elongated again, previous debridements have been helpful in reducing pain and improving function.  No other new issues  Objective:  Physical Exam: warm, good capillary refill, no trophic changes or ulcerative lesions, normal DP and PT pulses, and normal sensory exam. Left Foot: dystrophic yellowed discolored nail plates with subungual debris Right Foot: dystrophic yellowed discolored nail plates with subungual debris  Assessment:   1. Pain due to onychomycosis of toenails of both feet   2. Type 2 diabetes mellitus with other specified complication, without long-term current use of insulin (HCC)      Plan:  Patient was evaluated and treated and all questions answered.   Discussed the etiology and treatment options for the condition in detail with the patient. Recommended debridement of the nails today. Sharp and mechanical debridement performed of all painful and mycotic nails today. Nails debrided in length and thickness using a nail nipper to level of comfort. Discussed treatment options including appropriate shoe gear. Follow up as needed for painful nails.   Return in about 12 weeks (around 02/11/2024).

## 2024-01-21 DIAGNOSIS — H353231 Exudative age-related macular degeneration, bilateral, with active choroidal neovascularization: Secondary | ICD-10-CM | POA: Diagnosis not present

## 2024-02-11 ENCOUNTER — Ambulatory Visit: Payer: Medicare Other | Admitting: Podiatry

## 2024-02-25 ENCOUNTER — Ambulatory Visit: Payer: Medicare Other | Admitting: Podiatry

## 2024-03-10 ENCOUNTER — Ambulatory Visit (INDEPENDENT_AMBULATORY_CARE_PROVIDER_SITE_OTHER): Payer: Medicare Other | Admitting: Podiatry

## 2024-03-10 DIAGNOSIS — B351 Tinea unguium: Secondary | ICD-10-CM

## 2024-03-10 DIAGNOSIS — M79675 Pain in left toe(s): Secondary | ICD-10-CM

## 2024-03-10 DIAGNOSIS — M79674 Pain in right toe(s): Secondary | ICD-10-CM | POA: Diagnosis not present

## 2024-03-11 NOTE — Progress Notes (Signed)
  Subjective:  Patient ID: Amber Roman, female    DOB: 11/18/1946,  MRN: [id number]  Chief Complaint  Patient presents with   Nail Problem    RM 6: Needds nail trimmed    78 y.o. female presents with the above complaint. History confirmed with patient.  She is doing well the nails have thickened and elongated again, previous debridements have been helpful in reducing pain and improving function.  No other new issues  Objective:  Physical Exam: warm, good capillary refill, no trophic changes or ulcerative lesions, normal DP and PT pulses, and normal sensory exam. Left Foot: dystrophic yellowed discolored nail plates with subungual debris Right Foot: dystrophic yellowed discolored nail plates with subungual debris  Assessment:   1. Pain due to onychomycosis of toenails of both feet      Plan:  Patient was evaluated and treated and all questions answered.   Discussed the etiology and treatment options for the condition in detail with the patient. Recommended debridement of the nails today. Sharp and mechanical debridement performed of all painful and mycotic nails today. Nails debrided in length and thickness using a nail nipper to level of comfort. Discussed treatment options including appropriate shoe gear. Follow up as needed for painful nails.   Return in about 12 weeks (around 06/02/2024) for painful thick fungal nails.

## 2024-03-31 ENCOUNTER — Other Ambulatory Visit: Payer: Self-pay | Admitting: Nurse Practitioner

## 2024-03-31 DIAGNOSIS — R911 Solitary pulmonary nodule: Secondary | ICD-10-CM

## 2024-04-02 IMAGING — CT CT CHEST W/O CM
2 of 4 series · 15 of 36 positions shown, 18 images · non-contrast
Comparison: 05/20/2021.

CLINICAL DATA: Follow-up lung nodule.



[Series 2: thorax · axial · 0.77mm/px · z∈[+1430,+1666]mm · 12 of 140 slices shown, 15 images]
[im 11/140  mediastinal]
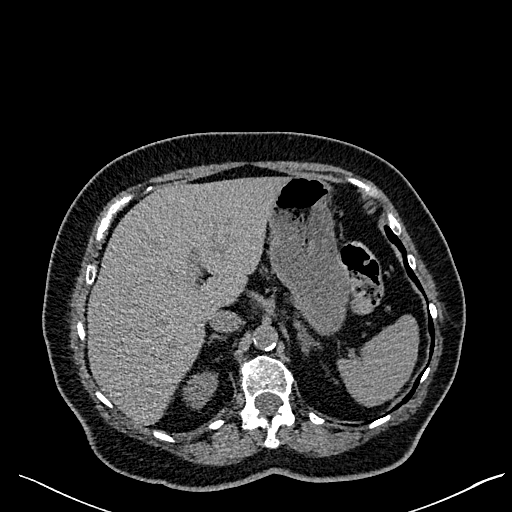
[im 11/140  lung]
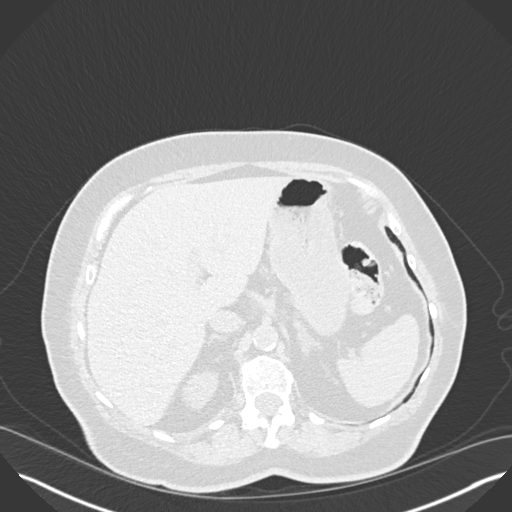
[im 22/140  lung]
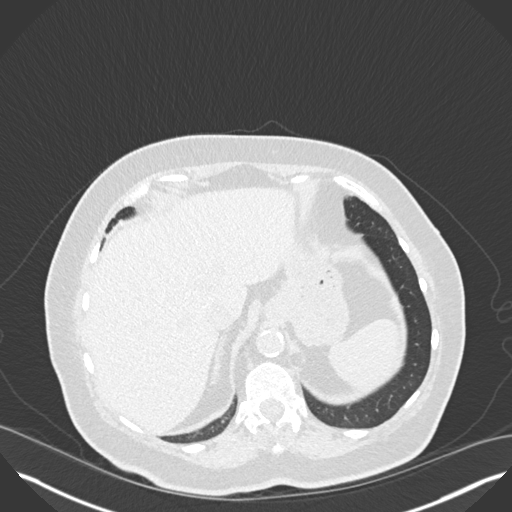
[im 33/140  lung]
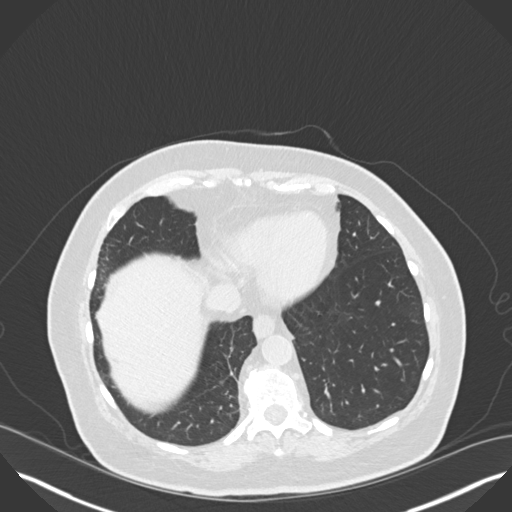
[im 43/140  lung]
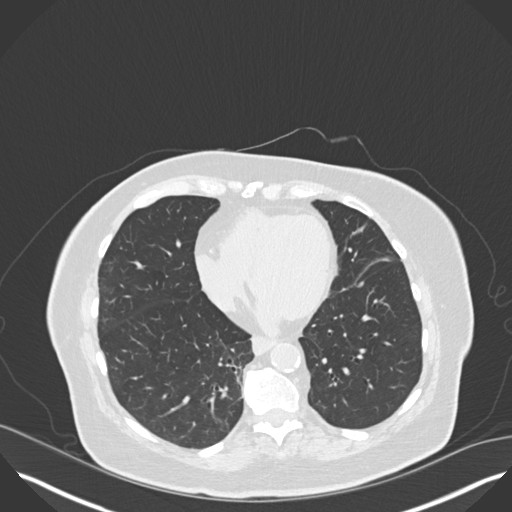
[im 54/140  mediastinal]
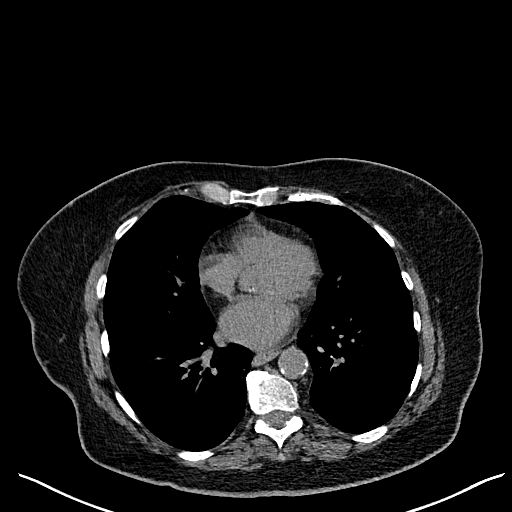
[im 54/140  lung]
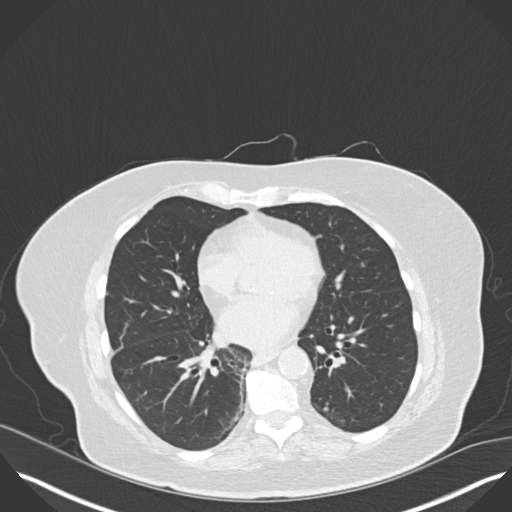
[im 65/140  lung]
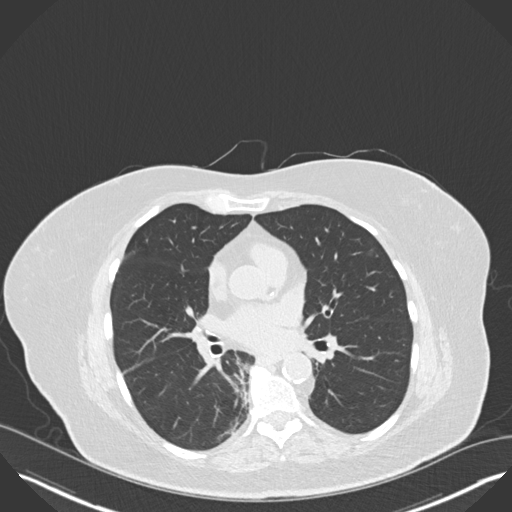
[im 75/140  lung]
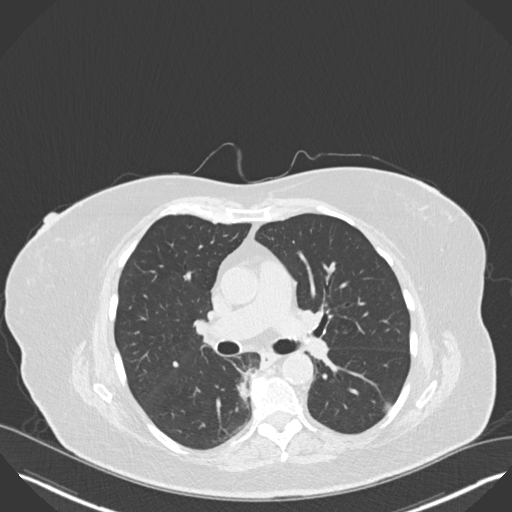
[im 86/140  lung]
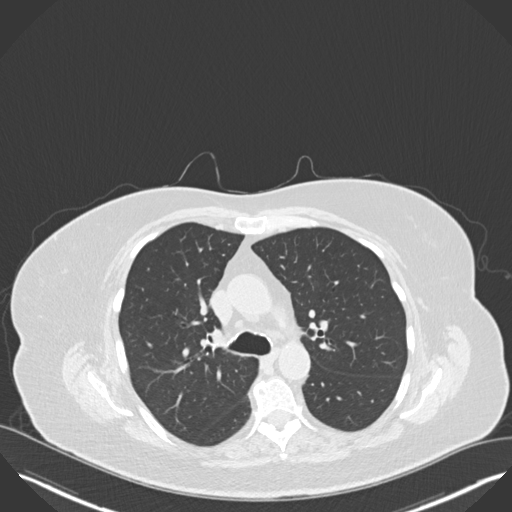
[im 97/140  mediastinal]
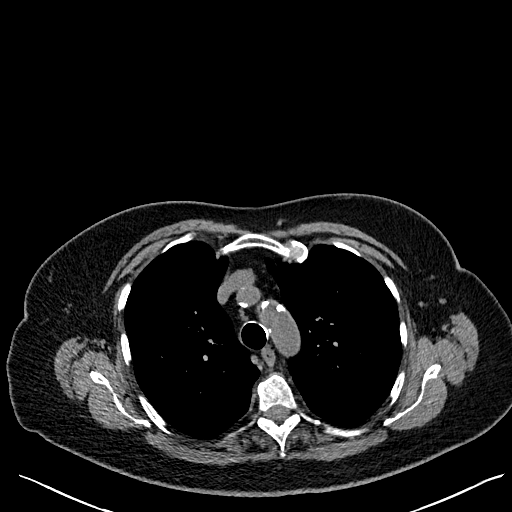
[im 97/140  lung]
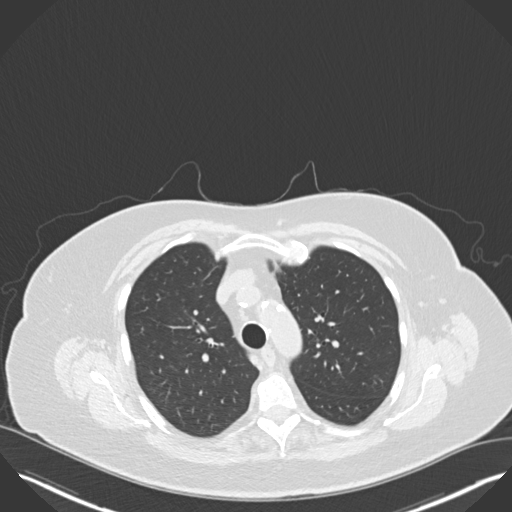
[im 107/140  lung]
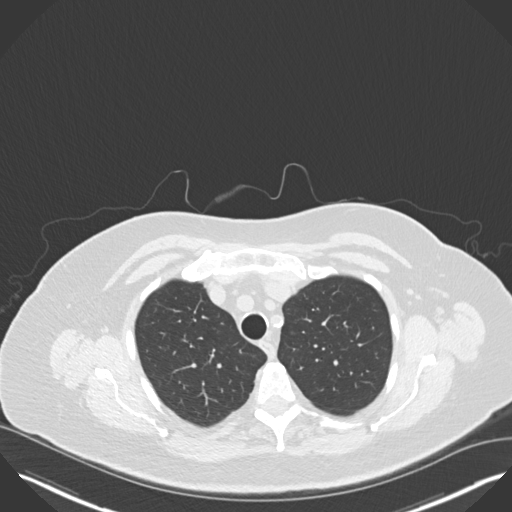
[im 118/140  lung]
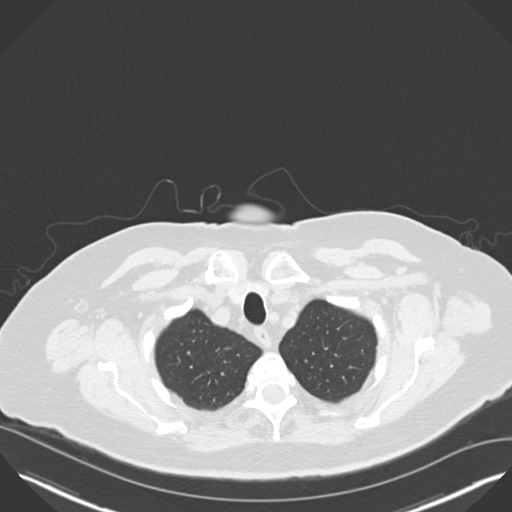
[im 129/140  lung]
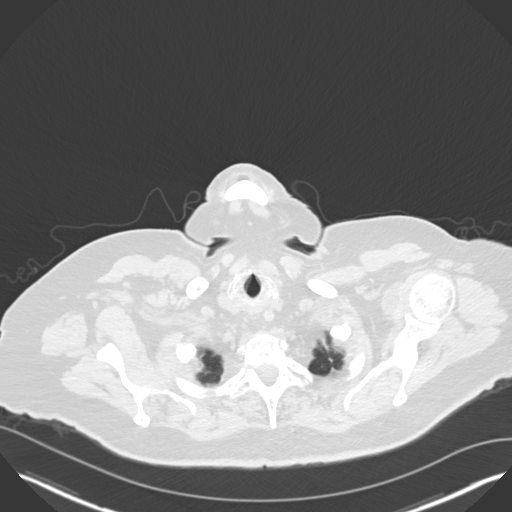

[Series 5: coronal · coronal · 0.61mm/px · 3 of 156 slices shown]
[im 32/156  lung]
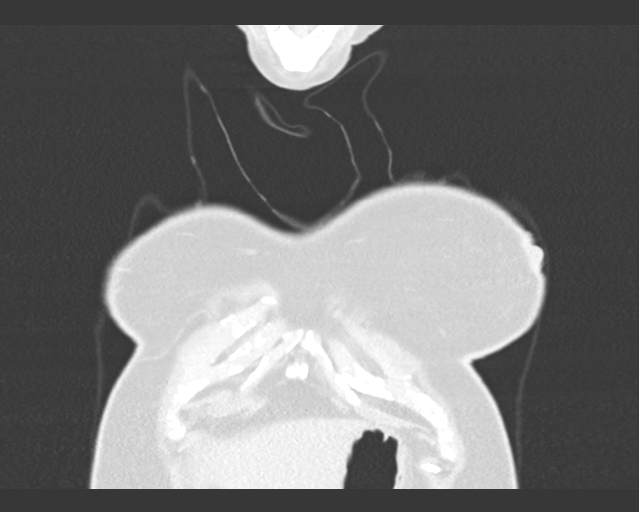
[im 63/156  lung]
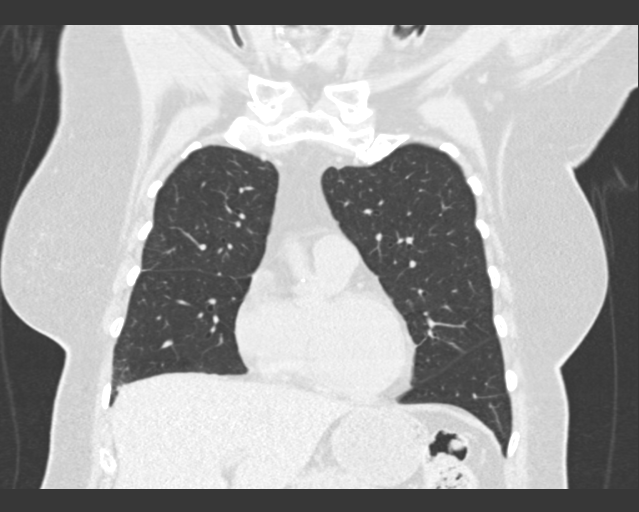
[im 94/156  lung]
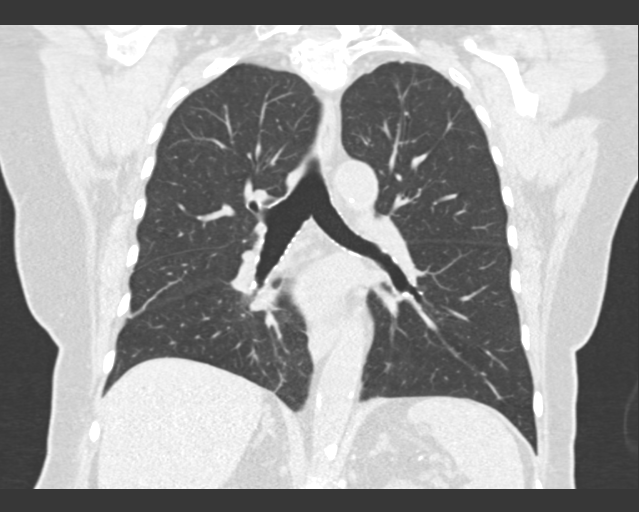

[15 of 36 positions shown; findings below may reference images not displayed]

FINDINGS: Cardiovascular: Heart normal in size and configuration. No
pericardial effusion. Mild left and right coronary artery
calcifications. Great vessels are normal in caliber. Stable aortic
atherosclerotic calcifications.

Mediastinum/Nodes: No enlarged mediastinal or axillary lymph nodes.
Thyroid gland, trachea, and esophagus demonstrate no significant
findings.

Lungs/Pleura: Multiple small solid and ground-glass nodules, without
change from the prior CT. Two similar sized ground-glass nodules, 5
mm, in the left upper lobe, image 33 and image 39, series 7. 4 mm
solid oval nodule, right upper lobe, image 66, series 7. Stable
perifissural nodule, right middle lobe, image 80, series 7. Pleural
based right middle lobe nodule, 3 mm, image 88, series 7. 4 mm
nodule, left lower lobe, image 110, series 7. 6 mm ground-glass
nodule, peripheral left lower lobe, image 67. Additional small
nodules, 3 mm in [place]. All of these are unchanged. No new nodules.

Stable scarring along the peripheral medial right lower lobe. Stable
interstitial thickening along the peripheral right middle lobe.

No evidence of pneumonia or pulmonary edema. No pleural effusion or
pneumothorax.

Upper Abdomen: No acute abnormality.

Musculoskeletal: No fracture or acute finding.  No bone lesion.
IMPRESSION: 1. Multiple small solid and ground-glass lung nodules, all stable
from the CT dated 05/20/2021. Largest ground-glass nodule is 6 mm,
left lower lobe. Recommend chest CT every 2 years until stable for 5
years based on Fleischner criteria.
2. No acute findings.

Aortic Atherosclerosis ([id number]).

## 2024-04-03 ENCOUNTER — Encounter: Payer: Self-pay | Admitting: Behavioral Health

## 2024-04-03 ENCOUNTER — Ambulatory Visit (INDEPENDENT_AMBULATORY_CARE_PROVIDER_SITE_OTHER): Payer: Self-pay | Admitting: Behavioral Health

## 2024-04-03 VITALS — BP 131/58 | HR 63 | Ht 61.0 in | Wt 145.0 lb

## 2024-04-03 DIAGNOSIS — H35033 Hypertensive retinopathy, bilateral: Secondary | ICD-10-CM | POA: Diagnosis not present

## 2024-04-03 DIAGNOSIS — F411 Generalized anxiety disorder: Secondary | ICD-10-CM | POA: Diagnosis not present

## 2024-04-03 DIAGNOSIS — H353211 Exudative age-related macular degeneration, right eye, with active choroidal neovascularization: Secondary | ICD-10-CM | POA: Diagnosis not present

## 2024-04-03 DIAGNOSIS — H43813 Vitreous degeneration, bilateral: Secondary | ICD-10-CM | POA: Diagnosis not present

## 2024-04-03 DIAGNOSIS — H43393 Other vitreous opacities, bilateral: Secondary | ICD-10-CM | POA: Diagnosis not present

## 2024-04-03 DIAGNOSIS — F330 Major depressive disorder, recurrent, mild: Secondary | ICD-10-CM | POA: Diagnosis not present

## 2024-04-03 DIAGNOSIS — E119 Type 2 diabetes mellitus without complications: Secondary | ICD-10-CM | POA: Diagnosis not present

## 2024-04-03 DIAGNOSIS — H353222 Exudative age-related macular degeneration, left eye, with inactive choroidal neovascularization: Secondary | ICD-10-CM | POA: Diagnosis not present

## 2024-04-03 DIAGNOSIS — D3131 Benign neoplasm of right choroid: Secondary | ICD-10-CM | POA: Diagnosis not present

## 2024-04-03 MED ORDER — DULOXETINE HCL 60 MG PO CPEP
60.0000 mg | ORAL_CAPSULE | Freq: Every day | ORAL | 1 refills | Status: DC
Start: 2024-04-03 — End: 2024-10-17

## 2024-04-03 NOTE — Progress Notes (Signed)
 [facility] MD/PA/NP Initial Note  04/03/2024 9:31 AM Amber Roman  MRN:  [id number]  Chief Complaint:  Chief Complaint   Depression; Anxiety; Medication Refill; Establish Care; Patient Education     HPI:  "Amber Roman", 78 year old female presents to this office for initial visit and to establish care.  Collateral information should be considered reliable.  Patient is a former patient of Dr. Aldona Bar.  Says that she is seeking a new provider to manage her medication due to now being on Medicare.  She reports being stable on Cymbalta for 20+ years and is not requesting any changes.  She reports anxiety and depression at 3/10.  PHQ 2 was negative.  MDQ was negative.  She is a little stressed due to being diagnosed with macular degeneration.  She has also type II diabetic.  Reports sleeping well 7 to 8 hours per night.  She lives alone in [place].  Denies mania, no psychosis, no auditory or visual hallucinations.  No delirium.  No SI or HI.  Past psychiatric medication trials: Paxil Lexapro     Visit Diagnosis: No diagnosis found.  Past Psychiatric History: Anxiety, MDD  Past Medical History:  Past Medical History:  Diagnosis Date   Allergic conjunctivitis    Arthritis    Basal cell carcinoma 10/29/1997   ledt nasal ala ([facility])   Bronchitis    Bruxism, sleep-related 04/13/2014   Depression    Esophageal reflux    Family history of heart disease    H/O abdominal hysterectomy    w/o recurrence   Hyperlipidemia    Hypertension    Insomnia 04/13/2014   Obstructive sleep apnea    improved on C Pap   Squamous cell carcinoma of skin 09/20/2015   left upper arm (Txpbx)   Uterine cancer (HCC)    hysterectomy    Past Surgical History:  Procedure Laterality Date   BILATERAL SALPINGOOPHORECTOMY     CARPAL TUNNEL RELEASE     bilateral   DOPPLER ECHOCARDIOGRAPHY     NASAL SEPTUM SURGERY     NM MYOVIEW LTD     ROTATOR CUFF REPAIR Right    TOTAL ABDOMINAL  HYSTERECTOMY     TOTAL HIP ARTHROPLASTY  01/10/2013   Procedure: TOTAL HIP ARTHROPLASTY ANTERIOR APPROACH;  Surgeon: Kathryne Hitch, MD;  Location: [facility];  Service: Orthopedics;  Laterality: Right;    Family Psychiatric History: see chart  Family History:  Family History  Problem Relation Age of Onset   Depression Mother    Lung disease Mother    Heart attack Father    High blood pressure Father    Multiple sclerosis Father    Diabetes Mellitus II Sister    High blood pressure Sister    Colon cancer Brother    High blood pressure Brother    Sleep apnea Neg Hx     Social History:  Social History   Socioeconomic History   Marital status: Single    Spouse name: Not on file   Number of children: 1   Years of education: College   Highest education level: Bachelor's degree (e.g., BA, AB, BS)  Occupational History   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   Patient has a college education.   Patient drinks one caffeine drink per day.   Patient is right-handed.   Patient has one child.         Social Drivers of Corporate investment banker Strain: Low Risk  (05/08/2023)   Overall Financial Resource Strain (CARDIA)    Difficulty of Paying Living Expenses: Not hard at all  Food Insecurity: No Food Insecurity (05/08/2023)   Hunger Vital Sign    Worried About Running Out of Food in the Last Year: Never true    Ran Out of  Food in the Last Year: Never true  Transportation Needs: No Transportation Needs (05/08/2023)   PRAPARE - Administrator, Civil Service (Medical): No    Lack of Transportation (Non-Medical): No  Physical Activity: Sufficiently Active (05/08/2023)   Exercise Vital Sign    Days of Exercise per Week: 7 days    Minutes of Exercise per Session: 30 min  Stress: No Stress Concern Present (05/08/2023)   [employer] of Occupational Health - Occupational Stress Questionnaire    Feeling of Stress : Not at all  Social Connections: Moderately Integrated (05/08/2023)   Social Connection and Isolation Panel [NHANES]    Frequency of Communication with Friends and Family: More than three times a week    Frequency of Social Gatherings with Friends and Family: More than three times a week    Attends Religious Services: More than 4 times per year    Active Member of [employer] or Organizations: Yes    Attends Banker Meetings: More than 4 times per year    Marital Status: Never married    Allergies:  Allergies  Allergen Reactions   Sulfonamide Derivatives Other (See Comments)    Childhood allergy     Metabolic Disorder Labs: Lab Results  Component Value Date   HGBA1C 7.0 (H) 05/18/2023   No results found for: "PROLACTIN" Lab Results  Component Value Date   CHOL 145 05/18/2023   TRIG 262.0 (H) 05/18/2023   HDL 49.90 05/18/2023   CHOLHDL 3 05/18/2023   VLDL 52.4 (H) 05/18/2023   LDLCALC 60 05/16/2022   LDLCALC 72 04/28/2020   Lab Results  Component Value Date   TSH 3.47 05/16/2022   TSH 3.59 09/19/2017    Therapeutic Level Labs: No results found for: "LITHIUM" No results found for: "VALPROATE" No results found for: "CBMZ"  Current Medications: Current Outpatient Medications  Medication Sig Dispense Refill   gabapentin (NEURONTIN) 300 MG capsule Take 1 capsule (300 mg total) by mouth in the morning and at bedtime. 180 capsule 3   guaiFENesin (MUCINEX) 600 MG  12 hr tablet Take 1,200 mg by mouth 2 (two) times daily as needed. For chest tightness     ipratropium-albuterol (DUONEB) 0.5-2.5 (3) MG/3ML SOLN Take 3 mLs by nebulization every 6 (six) hours as needed. 75 mL 12   irbesartan-hydrochlorothiazide (AVALIDE) 300-12.5 MG tablet Take 1 tablet by mouth daily. 90 tablet 3   meloxicam (MOBIC) 15 MG tablet TAKE 1 TABLET(15 MG) BY MOUTH DAILY 90 tablet 3  metFORMIN (GLUCOPHAGE) 500 MG tablet Take 1 tablet (500 mg total) by mouth daily with breakfast. 90 tablet 3   metoprolol succinate (TOPROL-XL) 100 MG 24 hr tablet Take 1 tablet (100 mg total) by mouth daily. 90 tablet 3   Multiple Vitamins-Minerals (PRESERVISION AREDS PO) Take by mouth daily.     Multiple Vitamins-Minerals (VITAMIN D3 COMPLETE PO) Take 50 mcg by mouth daily.     Nebulizers (COMPRESSOR NEBULIZER) MISC As directed 1 each 0   rosuvastatin (CRESTOR) 20 MG tablet Take 1 tablet (20 mg total) by mouth daily. 90 tablet 3   albuterol (PROAIR HFA) 108 (90 Base) MCG/ACT inhaler INHALE 2 PUFFS INTO THE LUNGS EVERY 6 HOURS AS NEEDED FOR WHEEZING OR SHORTNESS OF BREATH 18 g 2   azelastine (OPTIVAR) 0.05 % ophthalmic solution Place 1 drop into both eyes 2 (two) times daily. 6 mL 3   b complex vitamins capsule Take 1 capsule by mouth daily.     budesonide-formoterol (SYMBICORT) 160-4.5 MCG/ACT inhaler Inhale 2 puffs into the lungs 2 (two) times daily. Via AZ&ME pt assistance 3 each 3   buPROPion (WELLBUTRIN XL) 150 MG 24 hr tablet Take 150 mg by mouth every morning. (Patient not taking: Reported on 04/03/2024)     diphenoxylate-atropine (LOMOTIL) 2.5-0.025 MG tablet Take 1 tablet by mouth 4 (four) times daily as needed. For diarrhea 30 tablet 0   DULoxetine (CYMBALTA) 60 MG capsule Take 1 capsule by mouth daily.  1   fluticasone (FLONASE) 50 MCG/ACT nasal spray 1-2 puffs each nostril once daily while needed 16 g prn   Multiple Vitamin (MULTIVITAMIN) tablet Take 1 tablet by mouth daily.     traZODone  (DESYREL) 50 MG tablet TAKE 1 TABLET(50 MG) BY MOUTH AT BEDTIME AS NEEDED FOR SLEEP (Patient not taking: Reported on 04/03/2024) 30 tablet 5   No current facility-administered medications for this visit.    Medication Side Effects: none  Orders placed this visit:  No orders of the defined types were placed in this encounter.   Psychiatric Specialty Exam:  Review of Systems  Constitutional: Negative.   Musculoskeletal:  Positive for joint swelling.  Allergic/Immunologic: Positive for environmental allergies.    Blood pressure (!) 131/58, pulse 63, height 5\' 1"  (1.549 m), weight 145 lb (65.8 kg).Body mass index is 27.4 kg/m.  General Appearance: Casual and Neat  Eye Contact:  Good  Speech:  Clear and Coherent  Volume:  Normal  Mood:  NA  Affect:  Appropriate  Thought Process:  Coherent  Orientation:  Full (Time, Place, and Person)  Thought Content: Logical   Suicidal Thoughts:  No  Homicidal Thoughts:  No  Memory:  WNL  Judgement:  Good  Insight:  Good  Psychomotor Activity:  Normal  Concentration:  Concentration: Good  Recall:  Good  Fund of Knowledge: Good  Language: Good  Assets:  Desire for Improvement  ADL's:  Intact  Cognition: WNL  Prognosis:  Good   Screenings:  PHQ2-9    Flowsheet Row Clinical Support from 05/08/2023 in [facility] [place] Office Visit from 05/16/2022 in [facility] [place] Clinical Support from 05/04/2022 in [facility] [place] Nutrition from 07/04/2021 in [facility] Nutr Diab Ed  - A [facility] Office Visit from 05/02/2021 in [facility]  PHQ-2 Total Score 0 0 0 0 0  PHQ-9 Total Score 0 0 -- -- --  Receiving Psychotherapy: No   Treatment Plan/Recommendations:   Greater than 50% of face to face time with patient was spent on counseling and coordination of care. We discussed her long history of  anxiety and depression stemming back 30+ years.  She has been on duloxetine the majority of the time and the medication has worked very well.  She is requesting no changes to her medication at this time.  We agreed today to: To continue Cymbalta 60 mg daily. Will follow-up in 6 months to reassess Will report worsening symptoms or side effects promptly Provided emergency contact information number Reviewed PDMP  Lincoln Renshaw, NP

## 2024-04-19 NOTE — Progress Notes (Unsigned)
 HPI  female former smoker followed for asthma/bronchitis, rhinitis, allergic conjunctivitis, complicated by OSA ([facility]/ Dr Albertina Hugger), GERD PFT 09/17/17-minimal diffusion defect.  FVC 2.50/96%, FEV1 2.13/109%, ratio 0.85, FEF 25-75% 2.63/163%, no response to dilator.  TLC 92%, DLCO 77%.  ---------------------------------------------------------------------------   09/12/21- 78 year old female former smoker followed for asthma/bronchitis, rhinitis, allergic conjunctivitis, Lung Nodules, complicated by OSA ([facility]), GERD, HTN,  CAD, Aortic Atherosclerosis, Macular Degeneration CPAP 7/ managed by Neurology Dr Albertina Hugger. Trazodone  from PCP helps occ insomnia.  -Symbicort  160, Proair  hfa, flonase , Trazodone , Neb Duoneb,  Covid vax-4 Modernaa ------Asthma f/u, request flu vaccine today She winters in [place] , summers at a cabin in [place]. Seeking manufacturer's assistance for Trelegy from pharmacist working with her primary physician. Has felt well controlled with no significant exacerbation. Neurology continues to manage her OSA CT reviewed, discussing multiple small nodules and CAD.Aaron Aas CT chest 05/21/21-  IMPRESSION: 1. Multiple bilateral small solid and ground-glass pulmonary nodules measuring up to 5 mm. No follow-up needed if patient is low-risk (and has no known or suspected primary neoplasm). Non-contrast chest CT can be considered in 12 months if patient is high-risk. This recommendation follows the consensus statement: Guidelines for Management of Incidental Pulmonary Nodules Detected on CT Images: From the Fleischner Society 2017; Radiology 2017; 284:228-243. 2. Small peripheral ground-glass opacity in the left upper lobe may reflect an infectious or inflammatory process. 3. Three-vessel coronary artery calcifications. 4. Aortic atherosclerosis. Aortic Atherosclerosis (ICD10-I70.0).  04/24/22- 79 year old female former smoker followed for asthma/bronchitis, rhinitis, allergic  conjunctivitis, Lung Nodules, complicated by OSA ([facility]), GERD, HTN,  CAD/ Aortic Atherosclerosis, Macular Degeneration CPAP 7/ managed by Neurology Dr Albertina Hugger. Trazodone  from PCP helps occ insomnia.  -Symbicort  160, Proair  hfa Covid vax-4 moderna Flu vax-had -----Patient would like CXR or something to follow up on Nodule. Otherwise doing good. She missed her appointment for follow-up CT scan.  We discussed at our rescheduling that tract lung nodules.  CPAP is managed by neurology. She says her breathing is doing well with Symbicort  and occasional use of rescue inhaler, no acute events.  [place] and [place] in [place]  where she says she "breathes better".  04/21/24- 78 year old female former smoker followed for asthma/bronchitis, rhinitis, allergic conjunctivitis, Lung Nodules, complicated by OSA ([facility]), GERD, HTN,  CAD/ Aortic Atherosclerosis, Macular Degeneration CPAP 7/ managed by Neurology Dr Albertina Hugger. Trazodone  from PCP helps occ insomnia.  -Symbicort  160, Proair  hfa LOV Cobb, NP 04/19/23 CT chest pending 05/15/24 for nodules  ROS-see HPI + = positive Constitutional:   No-   weight loss, night sweats, fevers, chills, fatigue, lassitude. HEENT:   No-  headaches, difficulty swallowing, tooth/dental problems, +sore throat,     No- sneezing, itching, ear ache,             nasal congestion, post nasal drip,  CV:  No-   chest pain, orthopnea, PND, swelling in lower extremities, anasarca, dizziness, palpitations Resp: No-   shortness of breath with exertion or at rest.                No- coughing up of blood,  productive cough, +nonproductive cough            change in color of mucus.  + Occasional wheezing.   Skin: No-   rash or lesions. GI:  No-   heartburn, indigestion, abdominal pain, nausea, vomiting,  GU:   Neuro-     nothing unusual Psych:  No- change in mood or affect. No depression or  anxiety.  No memory loss.  OBJ General- Alert, Oriented,  Affect-appropriate, Distress- none acute, + overweight Skin- rash-none, lesions- none, excoriation- none Lymphadenopathy- none Head- atraumatic            Eyes- Gross vision intact, PERRLA, conjunctivae look pale, clear secretions            Ears- Hearing,             Nose- , no-Septal dev, mucus, polyps, erosion, perforation             Throat- Mallampati III , mucosa clear , drainage- none, tonsils- atrophic Neck- flexible , trachea midline, no stridor , thyroid  nl, carotid no bruit Chest - symmetrical excursion , unlabored           Heart/CV- RRR , no murmur , no gallop  , no rub, nl s1 s2                           - JVD- none , edema- none, stasis changes- none, varices- none           Lung-  Wheeze-none, cough-none, dullness-none, rub- none           Chest wall-  Abd-  Br/ Gen/ Rectal- Not done, not indicated Extrem- cyanosis- none, clubbing, none, atrophy- none, strength- nl Neuro- grossly intact to observation. No nystagmus, and no carotid bruit.

## 2024-04-21 ENCOUNTER — Ambulatory Visit: Payer: Medicare Other | Admitting: Internal Medicine

## 2024-04-21 ENCOUNTER — Other Ambulatory Visit: Payer: Self-pay | Admitting: Internal Medicine

## 2024-04-21 ENCOUNTER — Encounter: Payer: Self-pay | Admitting: Internal Medicine

## 2024-04-21 DIAGNOSIS — R918 Other nonspecific abnormal finding of lung field: Secondary | ICD-10-CM | POA: Diagnosis not present

## 2024-04-21 DIAGNOSIS — J4520 Mild intermittent asthma, uncomplicated: Secondary | ICD-10-CM

## 2024-04-21 DIAGNOSIS — Z87891 Personal history of nicotine dependence: Secondary | ICD-10-CM | POA: Diagnosis not present

## 2024-04-21 MED ORDER — ALBUTEROL SULFATE HFA 108 (90 BASE) MCG/ACT IN AERS
INHALATION_SPRAY | RESPIRATORY_TRACT | 12 refills | Status: AC
Start: 2024-04-21 — End: ?

## 2024-04-21 NOTE — Patient Instructions (Signed)
 Glad you are doing well  Albuterol  rescue inhaler refilled  Keep appointment for the screening chest CT to keep an eye on lung nodules

## 2024-05-08 ENCOUNTER — Ambulatory Visit (INDEPENDENT_AMBULATORY_CARE_PROVIDER_SITE_OTHER)

## 2024-05-08 VITALS — Ht 60.0 in | Wt 147.0 lb

## 2024-05-08 DIAGNOSIS — Z0000 Encounter for general adult medical examination without abnormal findings: Secondary | ICD-10-CM | POA: Diagnosis not present

## 2024-05-08 DIAGNOSIS — Z1159 Encounter for screening for other viral diseases: Secondary | ICD-10-CM

## 2024-05-08 NOTE — Progress Notes (Signed)
 Subjective:  Please attest and cosign this visit due to patients primary care provider not being in the office at the time the visit was completed.  (Pt of Dr Bambi Lever)   Amber Roman is a 78 y.o. who presents for a Medicare Wellness preventive visit.  As a reminder, Annual Wellness Visits don't include a physical exam, and some assessments may be limited, especially if this visit is performed virtually. We may recommend an in-person follow-up visit with your provider if needed.  Visit Complete: Virtual I connected with  Amber Roman on 05/08/24 by a audio enabled telemedicine application and verified that I am speaking with the correct person using two identifiers.  Patient Location: Home  Provider Location: Office/Clinic  I discussed the limitations of evaluation and management by telemedicine. The patient expressed understanding and agreed to proceed.  Vital Signs: Because this visit was a virtual/telehealth visit, some criteria may be missing or patient reported. Any vitals not documented were not able to be obtained and vitals that have been documented are patient reported.  VideoDeclined- This patient declined Librarian, academic. Therefore the visit was completed with audio only.  Persons Participating in Visit: Patient.  AWV Questionnaire: No: Patient Medicare AWV questionnaire was not completed prior to this visit.  Cardiac Risk Factors include: advanced age (>60men, >53 women);dyslipidemia;diabetes mellitus;hypertension;obesity (BMI >30kg/m2)     Objective:     Today's Vitals   05/08/24 1442  Weight: 147 lb (66.7 kg)  Height: 5' (1.524 m)   Body mass index is 28.71 kg/m.     05/08/2024    2:39 PM 05/08/2023    2:34 PM 05/04/2022    9:50 AM 07/04/2021    2:00 PM 01/10/2013    2:06 PM 01/10/2013    6:13 AM 12/31/2012   10:03 AM  Advanced Directives  Does Patient Have a Medical Advance Directive? Yes Yes Yes Yes Patient has  advance directive, copy not in chart Patient does not have advance directive   Type of Advance Directive Healthcare Power of [place];Living will Healthcare Power of [place];Living will Healthcare Power of [place];Living will    Healthcare Power of [place];Living will  Does patient want to make changes to medical advance directive?   No - Patient declined No - Patient declined     Copy of Healthcare Power of Attorney in Chart? No - copy requested No - copy requested No - copy requested      Pre-existing out of facility DNR order (yellow form or pink MOST form)     No  No    Current Medications (verified) Outpatient Encounter Medications as of 05/08/2024  Medication Sig   albuterol  (PROAIR  HFA) 108 (90 Base) MCG/ACT inhaler INHALE 2 PUFFS INTO THE LUNGS EVERY 6 HOURS AS NEEDED FOR WHEEZING OR SHORTNESS OF BREATH   azelastine  (OPTIVAR ) 0.05 % ophthalmic solution Place 1 drop into both eyes 2 (two) times daily.   b complex vitamins capsule Take 1 capsule by mouth daily.   budesonide -formoterol  (SYMBICORT ) 160-4.5 MCG/ACT inhaler Inhale 2 puffs into the lungs 2 (two) times daily. Via AZ&ME pt assistance   buPROPion (WELLBUTRIN XL) 150 MG 24 hr tablet Take 150 mg by mouth every morning.   diphenoxylate -atropine  (LOMOTIL ) 2.5-0.025 MG tablet Take 1 tablet by mouth 4 (four) times daily as needed. For diarrhea   DULoxetine  (CYMBALTA ) 60 MG capsule Take 1 capsule (60 mg total) by mouth daily.   fluticasone  (FLONASE ) 50 MCG/ACT nasal spray 1-2 puffs each nostril  once daily while needed   gabapentin  (NEURONTIN ) 300 MG capsule Take 1 capsule (300 mg total) by mouth in the morning and at bedtime.   guaiFENesin (MUCINEX) 600 MG 12 hr tablet Take 1,200 mg by mouth 2 (two) times daily as needed. For chest tightness   ipratropium-albuterol  (DUONEB) 0.5-2.5 (3) MG/3ML SOLN Take 3 mLs by nebulization every 6 (six) hours as needed.   irbesartan -hydrochlorothiazide  (AVALIDE) 300-12.5 MG tablet TAKE 1 TABLET BY  MOUTH DAILY   meloxicam  (MOBIC ) 15 MG tablet TAKE 1 TABLET(15 MG) BY MOUTH DAILY   metFORMIN  (GLUCOPHAGE ) 500 MG tablet Take 1 tablet (500 mg total) by mouth daily with breakfast.   metoprolol  succinate (TOPROL -XL) 100 MG 24 hr tablet Take 1 tablet (100 mg total) by mouth daily.   Multiple Vitamin (MULTIVITAMIN) tablet Take 1 tablet by mouth daily.   Multiple Vitamins-Minerals (PRESERVISION AREDS PO) Take by mouth daily.   Multiple Vitamins-Minerals (VITAMIN D3 COMPLETE PO) Take 50 mcg by mouth daily.   Nebulizers (COMPRESSOR NEBULIZER) MISC As directed   rosuvastatin  (CRESTOR ) 20 MG tablet TAKE 1 TABLET(20 MG) BY MOUTH DAILY   [DISCONTINUED] DULoxetine  (CYMBALTA ) 60 MG capsule Take 1 capsule by mouth daily.   traZODone  (DESYREL ) 50 MG tablet TAKE 1 TABLET(50 MG) BY MOUTH AT BEDTIME AS NEEDED FOR SLEEP (Patient not taking: Reported on 05/08/2024)   No facility-administered encounter medications on file as of 05/08/2024.    Allergies (verified) Sulfonamide derivatives   History: Past Medical History:  Diagnosis Date   Allergic conjunctivitis    Arthritis    Basal cell carcinoma 10/29/1997   ledt nasal ala (MOHS)   Bronchitis    Bruxism, sleep-related 04/13/2014   Depression    Esophageal reflux    Family history of heart disease    H/O abdominal hysterectomy    w/o recurrence   Hyperlipidemia    Hypertension    Insomnia 04/13/2014   Obstructive sleep apnea    improved on C Pap   Squamous cell carcinoma of skin 09/20/2015   left upper arm (Txpbx)   Uterine cancer (HCC)    hysterectomy   Past Surgical History:  Procedure Laterality Date   BILATERAL SALPINGOOPHORECTOMY     CARPAL TUNNEL RELEASE     bilateral   DOPPLER ECHOCARDIOGRAPHY     NASAL SEPTUM SURGERY     NM MYOVIEW LTD     ROTATOR CUFF REPAIR Right    TOTAL ABDOMINAL HYSTERECTOMY     TOTAL HIP ARTHROPLASTY  01/10/2013   Procedure: TOTAL HIP ARTHROPLASTY ANTERIOR APPROACH;  Surgeon: Arnie Lao, MD;   Location: [facility];  Service: Orthopedics;  Laterality: Right;   Family History  Problem Relation Age of Onset   Depression Mother    Lung disease Mother    Heart attack Father    High blood pressure Father    Multiple sclerosis Father    Diabetes Mellitus II Sister    High blood pressure Sister    Colon cancer Brother    High blood pressure Brother    Sleep apnea Neg Hx    Social History   Socioeconomic History   Marital status: Single    Spouse name: Not on file   Number of children: 1   Years of education: College   Highest education level: Bachelor's degree (e.g., BA, AB, BS)  Occupational History   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   Patient has a college education.   Patient drinks one caffeine drink per day.   Patient is right-handed.   Patient has one child.         Social Drivers of Corporate investment banker Strain: Low Risk  (05/08/2024)   Overall Financial Resource Strain (CARDIA)    Difficulty of Paying Living Expenses: Not hard at all  Food Insecurity: No Food Insecurity (05/08/2024)   Hunger Vital Sign    Worried About Running Out of Food in the Last Year: Never true    Ran Out of Food in the Last Year: Never true  Transportation Needs: No Transportation Needs (05/08/2024)   PRAPARE - Administrator, Civil Service (Medical): No    Lack of Transportation  (Non-Medical): No  Physical Activity: Sufficiently Active (05/08/2024)   Exercise Vital Sign    Days of Exercise per Week: 7 days    Minutes of Exercise per Session: 30 min  Stress: No Stress Concern Present (05/08/2024)   [employer] of Occupational Health - Occupational Stress Questionnaire    Feeling of Stress : Not at all  Social Connections: Moderately Integrated (05/08/2024)   Social Connection and Isolation Panel [NHANES]    Frequency of Communication with Friends and Family: More than three times a week    Frequency of Social Gatherings with Friends and Family: More than three times a week    Attends Religious Services: More than 4 times per year    Active Member of [employer] or Organizations: Yes    Attends Engineer, structural: More than 4 times per year    Marital Status: Never married    Tobacco Counseling Counseling given: No    Clinical Intake:  Pre-visit preparation completed: Yes  Pain : No/denies pain     BMI - recorded: 28.71 Nutritional Status: BMI 25 -29 Overweight Nutritional Risks: None Diabetes: Yes CBG done?: No Did pt. bring in CBG monitor from home?: No  Lab Results  Component Value Date   HGBA1C 7.0 (H) 05/18/2023   HGBA1C 6.2 (A) 08/08/2022   HGBA1C 6.7 (H) 05/16/2022     How often do you need to have someone help you when you read instructions, pamphlets, or other written materials from your doctor or pharmacy?: 1 - Never  Interpreter Needed?: No  Information entered by :: Kandy Orris, CMA   Activities of Daily Living     05/08/2024    2:45 PM  In your present state of health, do you have any difficulty performing the following activities:  Hearing? 0  Vision? 0  Difficulty concentrating or making decisions? 0  Walking or climbing stairs? 0  Dressing or bathing? 0  Doing errands, shopping? 0  Preparing Food and eating ? N  Using the Toilet? N  In the past six months, have you accidently leaked urine? Y  Comment  seldom but no need to wear a pantyliner  Do you have problems with loss of bowel control? N  Managing your Medications? N  Managing your Finances? N  Housekeeping or managing your Housekeeping? N    Patient Care Team: Adelia Homestead, MD as PCP - General (Internal Medicine) Avanell Leigh, MD as PCP - Cardiology (Cardiology) Dema Filler, MD as Consulting Physician (Ophthalmology) Alyce Jubilee, MD as Referring Physician (Ophthalmology) Faustina Hood, MD as Consulting Physician (Pulmonary Disease) Alyce Jubilee, MD as Referring Physician (Ophthalmology)  Indicate any recent Medical Services you may have received from other than [facility] providers in the past year (date may be approximate).     Assessment:    This is a routine wellness examination for [place].  Hearing/Vision screen Hearing Screening - Comments:: Denies hearing difficulties   Vision Screening - Comments:: Wears rx glasses - up to date with routine eye exams with Dr Lenon Radar   Goals Addressed               This Visit's Progress     Patient Stated (pt-stated)        Patient stated she plans to reduce sugar intake       Depression Screen     05/08/2024    2:47 PM 04/03/2024    9:44 AM 05/08/2023    2:35 PM 05/16/2022    9:09 AM 05/04/2022    9:54 AM 07/04/2021    2:01 PM 05/02/2021    1:12 PM  PHQ 2/9 Scores  PHQ - 2 Score 0  0 0 0 0 0  PHQ- 9 Score 0  0 0        Information is confidential and restricted. Go to Review Flowsheets to unlock data.    Fall Risk     05/08/2024    2:46 PM 05/18/2023    1:44 PM 05/08/2023    2:35 PM 05/16/2022    9:09 AM 05/04/2022    9:51 AM  Fall Risk   Falls in the past year? 0 0 0 1 1  Number falls in past yr: 0 0 0 0 0  Injury with Fall? 0 0 0 0 0  Risk for fall due to :   No Fall Risks  No Fall Risks  Follow up Falls evaluation completed;Falls prevention discussed  Falls prevention discussed  Falls prevention discussed    MEDICARE RISK AT HOME:   Medicare Risk at Home Any stairs in or around the home?: No If so, are there any without handrails?: No Home free of loose throw rugs in walkways, pet beds, electrical cords, etc?: Yes Adequate lighting in your home to reduce risk of falls?: Yes Life alert?: No Use of a cane, walker or w/c?: No Grab bars in the bathroom?: No Shower chair or bench in shower?: No Elevated toilet seat or a handicapped toilet?: No  TIMED UP AND GO:  Was the test performed?  No  Cognitive Function: 6CIT completed      09/04/2016    8:03 AM  Montreal Cognitive Assessment   Visuospatial/ Executive (0/5) 5  Naming (0/3) 3  Attention: Read list of digits (0/2) 2  Attention: Read list of letters (0/1) 1  Attention: Serial 7 subtraction starting at 100 (0/3) 3  Language: Repeat phrase (0/2) 2  Language : Fluency (0/1) 1  Abstraction (0/2) 2  Delayed Recall (0/5) 3  Orientation (0/6) 6  Total 28  Adjusted Score (based on education) 28  05/08/2024    2:49 PM 05/08/2023    2:44 PM 05/04/2022   10:00 AM  6CIT Screen  What Year? 0 points 0 points 0 points  What month? 0 points 0 points 0 points  What time? 0 points 0 points 0 points  Count back from 20 0 points 0 points 0 points  Months in reverse 0 points 0 points 0 points  Repeat phrase 0 points 0 points 0 points  Total Score 0 points 0 points 0 points    Immunizations Immunization History  Administered Date(s) Administered   DT (Pediatric) 06/16/2013   Fluad Quad(high Dose 65+) 09/19/2019, 09/12/2021   Influenza Split 08/19/2011, 09/17/2013, 11/16/2014, 09/15/2015, 09/14/2016   Influenza Whole 09/26/2006, 09/17/2009, 10/06/2010, 09/16/2012   Influenza, High Dose Seasonal PF 10/15/2018   Influenza-Unspecified 09/26/2017, 08/30/2018   Moderna Sars-Covid-2 Vaccination 01/23/2020, 02/19/2020, 10/29/2020, 05/04/2021   Pneumococcal Conjugate-13 10/22/2018   Pneumococcal Polysaccharide-23 10/29/2003, 09/17/2009   Pneumococcal-Unspecified  05/22/2014, 07/06/2014   Td 07/20/2003   Tdap 06/19/2013   Zoster Recombinant(Shingrix) 09/17/2020, 11/19/2020   Zoster, Live 05/30/2011    Screening Tests Health Maintenance  Topic Date Due   Hepatitis C Screening  Never done   DTaP/Tdap/Td (4 - Td or Tdap) 06/20/2023   COVID-19 Vaccine (5 - 2024-25 season) 08/19/2023   HEMOGLOBIN A1C  11/17/2023   FOOT EXAM  04/25/2024   Diabetic kidney evaluation - eGFR measurement  05/17/2024   Diabetic kidney evaluation - Urine ACR  05/17/2024   INFLUENZA VACCINE  07/18/2024   OPHTHALMOLOGY EXAM  09/23/2024   Medicare Annual Wellness (AWV)  05/08/2025   Pneumonia Vaccine 58+ Years old  Completed   DEXA SCAN  Completed   Zoster Vaccines- Shingrix  Completed   HPV VACCINES  Aged Out   Meningococcal B Vaccine  Aged Out   Colonoscopy  Discontinued    Health Maintenance  Health Maintenance Due  Topic Date Due   Hepatitis C Screening  Never done   DTaP/Tdap/Td (4 - Td or Tdap) 06/20/2023   COVID-19 Vaccine (5 - 2024-25 season) 08/19/2023   HEMOGLOBIN A1C  11/17/2023   FOOT EXAM  04/25/2024   Diabetic kidney evaluation - eGFR measurement  05/17/2024   Diabetic kidney evaluation - Urine ACR  05/17/2024   Health Maintenance Items Addressed: Hepatitis C Screening ordered today  Additional Screening:  Vision Screening: Recommended annual ophthalmology exams for early detection of glaucoma and other disorders of the eye.  Dental Screening: Recommended annual dental exams for proper oral hygiene  Community Resource Referral / Chronic Care Management: CRR required this visit?  No   CCM required this visit?  No   Plan:    I have personally reviewed and noted the following in the patient's chart:   Medical and social history Use of alcohol, tobacco or illicit drugs  Current medications and supplements including opioid prescriptions. Patient is not currently taking opioid prescriptions. Functional ability and status Nutritional  status Physical activity Advanced directives List of other physicians Hospitalizations, surgeries, and ER visits in previous 12 months Vitals Screenings to include cognitive, depression, and falls Referrals and appointments  In addition, I have reviewed and discussed with patient certain preventive protocols, quality metrics, and best practice recommendations. A written personalized care plan for preventive services as well as general preventive health recommendations were provided to patient.   Patria Bookbinder, CMA   05/08/2024   After Visit Summary: (MyChart) Due to this being a telephonic visit, the after visit summary with patients personalized plan  was offered to patient via MyChart   Notes: Nothing significant to report at this time.

## 2024-05-08 NOTE — Patient Instructions (Signed)
 Amber Roman , Thank you for taking time out of your busy schedule to complete your Annual Wellness Visit with me. I enjoyed our conversation and look forward to speaking with you again next year. I, as well as your care team,  appreciate your ongoing commitment to your health goals. Please review the following plan we discussed and let me know if I can assist you in the future. Your Game plan/ To Do List Follow up Visits: Next Medicare AWV with our clinical staff: 05/20/2025   Have you seen your provider in the last 6 months (3 months if uncontrolled diabetes)? Yes Next Office Visit with your provider: 05/19/2024  Clinician Recommendations:  Aim for 30 minutes of exercise or brisk walking, 6-8 glasses of water, and 5 servings of fruits and vegetables each day. Educated and advised on getting the COVID and Tdap (Tetenus) vaccines in 2025 at local pharmacy.  Ordered a Hepatitis C Screening (Lab).      This is a list of the screening recommended for you and due dates:  Health Maintenance  Topic Date Due   Hepatitis C Screening  Never done   DTaP/Tdap/Td vaccine (4 - Td or Tdap) 06/20/2023   COVID-19 Vaccine (5 - 2024-25 season) 08/19/2023   Hemoglobin A1C  11/17/2023   Complete foot exam   04/25/2024   Yearly kidney function blood test for diabetes  05/17/2024   Yearly kidney health urinalysis for diabetes  05/17/2024   Flu Shot  07/18/2024   Eye exam for diabetics  09/23/2024   Medicare Annual Wellness Visit  05/08/2025   Pneumonia Vaccine  Completed   DEXA scan (bone density measurement)  Completed   Zoster (Shingles) Vaccine  Completed   HPV Vaccine  Aged Out   Meningitis B Vaccine  Aged Out   Colon Cancer Screening  Discontinued    Advanced directives: (Copy Requested) Please bring a copy of your health care power of attorney and living will to the office to be added to your chart at your convenience. You can mail to [facility] [address] [place], [place] [address] or  email to [email or web address] Advance Care Planning is important because it:  [x]  Makes sure you receive the medical care that is consistent with your values, goals, and preferences  [x]  It provides guidance to your family and loved ones and reduces their decisional burden about whether or not they are making the right decisions based on your wishes.  Follow the link provided in your after visit summary or read over the paperwork we have mailed to you to help you started getting your Advance Directives in place. If you need assistance in completing these, please reach out to us  so that we can help you!

## 2024-05-13 NOTE — Progress Notes (Unsigned)
 PATIENT: Amber Roman DOB: Apr 21, 1946  REASON FOR VISIT: follow up HISTORY FROM: patient  No chief complaint on file.    HISTORY OF PRESENT ILLNESS: Today 05/13/24:  Amber Roman is a 78 y.o. female with a history of OSA on CPAP. Returns today for follow-up.      05/15/23: Amber Roman is a 78 y.o. female with a history of OSA on CPAP . Returns today for follow-up.  Reports that the CPAP is working well.  Denies any new issues.  Her download is below.   05/09/22: Amber Roman is a 78 year old female with a history of OSA On CPAP. She returns today for follow-up. Denies any issues. DL is below. /  0/10/27: Amber Roman is a 78 year old female with a history of obstructive sleep apnea on CPAP.  She returns today for follow-up.  Overall she feels that the CPAP works well for her.  She denies any new issues.  Her download is below.  Neuropathy: Patient reports that she does not have any pain in the hands or legs.  Denies any numbness.  She states on the right foot she feels as if she has a "duck" foot.  She states that it feels as if her first 3 toes are connected together.  Other than that she does not have any other symptoms.  No change in her gait or balance.  She states that the symptoms have not progressed over the last 5 years.  She is on Cymbalta  prescribed by Dr. Gavin Kast     REVIEW OF SYSTEMS: Out of a complete 14 system review of symptoms, the patient complains only of the following symptoms, and all other reviewed systems are negative.   ESS 0  ALLERGIES: Allergies  Allergen Reactions   Sulfonamide Derivatives Other (See Comments)    Childhood allergy      HOME MEDICATIONS: Outpatient Medications Prior to Visit  Medication Sig Dispense Refill   albuterol  (PROAIR  HFA) 108 (90 Base) MCG/ACT inhaler INHALE 2 PUFFS INTO THE LUNGS EVERY 6 HOURS AS NEEDED FOR WHEEZING OR SHORTNESS OF BREATH 18 g 12   azelastine  (OPTIVAR ) 0.05 % ophthalmic solution Place 1 drop into  both eyes 2 (two) times daily. 6 mL 3   b complex vitamins capsule Take 1 capsule by mouth daily.     budesonide -formoterol  (SYMBICORT ) 160-4.5 MCG/ACT inhaler Inhale 2 puffs into the lungs 2 (two) times daily. Via [place]&ME pt assistance 3 each 3   buPROPion (WELLBUTRIN XL) 150 MG 24 hr tablet Take 150 mg by mouth every morning.     diphenoxylate -atropine  (LOMOTIL ) 2.5-0.025 MG tablet Take 1 tablet by mouth 4 (four) times daily as needed. For diarrhea 30 tablet 0   DULoxetine  (CYMBALTA ) 60 MG capsule Take 1 capsule (60 mg total) by mouth daily. 90 capsule 1   fluticasone  (FLONASE ) 50 MCG/ACT nasal spray 1-2 puffs each nostril once daily while needed 16 g prn   gabapentin  (NEURONTIN ) 300 MG capsule Take 1 capsule (300 mg total) by mouth in the morning and at bedtime. 180 capsule 3   guaiFENesin (MUCINEX) 600 MG 12 hr tablet Take 1,200 mg by mouth 2 (two) times daily as needed. For chest tightness     ipratropium-albuterol  (DUONEB) 0.5-2.5 (3) MG/3ML SOLN Take 3 mLs by nebulization every 6 (six) hours as needed. 75 mL 12   irbesartan -hydrochlorothiazide  (AVALIDE) 300-12.5 MG tablet TAKE 1 TABLET BY MOUTH DAILY 90 tablet 0   meloxicam  (MOBIC ) 15 MG tablet TAKE 1 TABLET(15  MG) BY MOUTH DAILY 90 tablet 3   metFORMIN  (GLUCOPHAGE ) 500 MG tablet Take 1 tablet (500 mg total) by mouth daily with breakfast. 90 tablet 3   metoprolol  succinate (TOPROL -XL) 100 MG 24 hr tablet Take 1 tablet (100 mg total) by mouth daily. 90 tablet 3   Multiple Vitamin (MULTIVITAMIN) tablet Take 1 tablet by mouth daily.     Multiple Vitamins-Minerals (PRESERVISION AREDS PO) Take by mouth daily.     Multiple Vitamins-Minerals (VITAMIN D3 COMPLETE PO) Take 50 mcg by mouth daily.     Nebulizers (COMPRESSOR NEBULIZER) MISC As directed 1 each 0   rosuvastatin  (CRESTOR ) 20 MG tablet TAKE 1 TABLET(20 MG) BY MOUTH DAILY 90 tablet 0   traZODone  (DESYREL ) 50 MG tablet TAKE 1 TABLET(50 MG) BY MOUTH AT BEDTIME AS NEEDED FOR SLEEP (Patient not  taking: Reported on 05/08/2024) 30 tablet 5   No facility-administered medications prior to visit.    PAST MEDICAL HISTORY: Past Medical History:  Diagnosis Date   Allergic conjunctivitis    Arthritis    Basal cell carcinoma 10/29/1997   ledt nasal ala ([facility])   Bronchitis    Bruxism, sleep-related 04/13/2014   Depression    Esophageal reflux    Family history of heart disease    H/O abdominal hysterectomy    w/o recurrence   Hyperlipidemia    Hypertension    Insomnia 04/13/2014   Obstructive sleep apnea    improved on C Pap   Squamous cell carcinoma of skin 09/20/2015   left upper arm (Txpbx)   Uterine cancer (HCC)    hysterectomy    PAST SURGICAL HISTORY: Past Surgical History:  Procedure Laterality Date   BILATERAL SALPINGOOPHORECTOMY     CARPAL TUNNEL RELEASE     bilateral   DOPPLER ECHOCARDIOGRAPHY     NASAL SEPTUM SURGERY     NM MYOVIEW LTD     ROTATOR CUFF REPAIR Right    TOTAL ABDOMINAL HYSTERECTOMY     TOTAL HIP ARTHROPLASTY  01/10/2013   Procedure: TOTAL HIP ARTHROPLASTY ANTERIOR APPROACH;  Surgeon: Arnie Lao, MD;  Location: [facility];  Service: Orthopedics;  Laterality: Right;    FAMILY HISTORY: Family History  Problem Relation Age of Onset   Depression Mother    Lung disease Mother    Heart attack Father    High blood pressure Father    Multiple sclerosis Father    Diabetes Mellitus II Sister    High blood pressure Sister    Colon cancer Brother    High blood pressure Brother    Sleep apnea Neg Hx     SOCIAL HISTORY: Social History   Socioeconomic History   Marital status: Single    Spouse name: Not on file   Number of children: 1   Years of education: College   Highest education level: Bachelor's degree (e.g., BA, AB, BS)  Occupational History   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   Patient has a college education.   Patient drinks one caffeine drink per day.   Patient is right-handed.   Patient has one child.         Social Drivers of Corporate investment banker Strain: Low Risk  (05/08/2024)   Overall Financial Resource Strain (CARDIA)    Difficulty of Paying Living Expenses: Not hard at all  Food Insecurity: No Food Insecurity (05/08/2024)   Hunger Vital Sign    Worried About Running Out of Food in the Last Year: Never true    Ran Out of Food in the Last Year: Never true  Transportation Needs: No Transportation Needs (05/08/2024)   PRAPARE - Administrator, Civil Service (Medical): No    Lack of Transportation (Non-Medical): No  Physical Activity: Sufficiently Active (05/08/2024)   Exercise Vital Sign    Days of Exercise per Week: 7 days    Minutes of Exercise per Session: 30 min  Stress: No Stress Concern Present (05/08/2024)   [employer] of Occupational Health - Occupational Stress Questionnaire    Feeling of Stress : Not at all  Social Connections: Moderately Integrated (05/08/2024)   Social Connection and Isolation Panel [NHANES]    Frequency of Communication with Friends and Family: More than three times a week    Frequency of Social Gatherings with Friends and Family: More than three times a week    Attends Religious Services: More than 4 times per year    Active Member of [employer] or Organizations: Yes    Attends Banker Meetings: More than 4 times per year    Marital Status: Never  married  Intimate Partner Violence: Not At Risk (05/08/2024)   Humiliation, Afraid, Rape, and Kick questionnaire    Fear of Current or Ex-Partner: No    Emotionally Abused: No    Physically Abused: No    Sexually Abused: No      PHYSICAL EXAM  There were no vitals filed for this visit.  There is no height or weight on file to calculate BMI.  Generalized: Well developed, in no acute distress  Chest: Lungs clear to auscultation bilaterally  Neurological examination  Mentation: Alert oriented to time, place, history taking. Follows all commands speech and language fluent Cranial nerve II-XII: Facial Symmetry noted   DIAGNOSTIC DATA (LABS, IMAGING, TESTING) - I reviewed patient records, labs, notes, testing and imaging myself where available.  Lab Results  Component Value Date   WBC 6.7 05/18/2023   HGB 12.2 05/18/2023   HCT 38.1 05/18/2023   MCV 83.2 05/18/2023   PLT 213.0 05/18/2023      Component Value Date/Time   NA 141 05/18/2023 1406   NA 141 09/19/2017 0000   K 5.0 05/18/2023 1406   CL 104 05/18/2023 1406   CO2 33 (H) 05/18/2023 1406   GLUCOSE 141 (H) 05/18/2023 1406   BUN 22 05/18/2023 1406   BUN 22 (A) 09/19/2017 0000   CREATININE 1.00 05/18/2023 1406   CALCIUM  9.4 05/18/2023 1406   PROT 6.7 05/18/2023 1406   PROT 7.1 07/22/2019 1158   ALBUMIN 4.1 05/18/2023 1406   ALBUMIN 5.0 (H) 07/22/2019 1158   AST 18 05/18/2023 1406   ALT 22 05/18/2023 1406   ALKPHOS 71 05/18/2023 1406   BILITOT 0.4 05/18/2023 1406  BILITOT 0.3 07/22/2019 1158   GFRNONAA >90 01/11/2013 0444   GFRAA >90 01/11/2013 0444   Lab Results  Component Value Date   CHOL 145 05/18/2023   HDL 49.90 05/18/2023   LDLCALC 60 05/16/2022   LDLDIRECT 69.0 05/18/2023   TRIG 262.0 (H) 05/18/2023   CHOLHDL 3 05/18/2023   Lab Results  Component Value Date   HGBA1C 7.0 (H) 05/18/2023   Lab Results  Component Value Date   VITAMINB12 1,444 (H) 05/16/2022   Lab Results  Component Value  Date   TSH 3.47 05/16/2022      ASSESSMENT AND PLAN 78 y.o. year old female  has a past medical history of Allergic conjunctivitis, Arthritis, Basal cell carcinoma (10/29/1997), Bronchitis, Bruxism, sleep-related (04/13/2014), Depression, Esophageal reflux, Family history of heart disease, H/O abdominal hysterectomy, Hyperlipidemia, Hypertension, Insomnia (04/13/2014), Obstructive sleep apnea, Squamous cell carcinoma of skin (09/20/2015), and Uterine cancer (HCC). here with:  OSA on CPAP  - CPAP compliance excellent - Good treatment of AHI  - Encourage patient to use CPAP nightly and > 4 hours each night - F/U in 1 year or sooner if needed     Clem Currier, MSN, NP-C 05/13/2024, 3:48 PM [facility] [address] [place], [place] [address] [phone]

## 2024-05-14 ENCOUNTER — Encounter: Payer: Self-pay | Admitting: Adult Health

## 2024-05-14 ENCOUNTER — Ambulatory Visit (INDEPENDENT_AMBULATORY_CARE_PROVIDER_SITE_OTHER): Payer: Medicare Other | Admitting: Adult Health

## 2024-05-14 VITALS — BP 133/66 | HR 65 | Ht 61.0 in | Wt 148.0 lb

## 2024-05-14 DIAGNOSIS — G4733 Obstructive sleep apnea (adult) (pediatric): Secondary | ICD-10-CM | POA: Diagnosis not present

## 2024-05-14 NOTE — Patient Instructions (Addendum)
 Continue using CPAP nightly and greater than 4 hours each night Discussed with PCP about increasing gabapentin  dose to 400 mg at bedtime since you are not taking it twice a day If your symptoms worsen or you develop new symptoms please let us  know.

## 2024-05-15 ENCOUNTER — Ambulatory Visit
Admission: RE | Admit: 2024-05-15 | Discharge: 2024-05-15 | Disposition: A | Discharge status: Home / Self Care | Source: Ambulatory Visit | Attending: Nurse Practitioner

## 2024-05-15 DIAGNOSIS — I2510 Atherosclerotic heart disease of native coronary artery without angina pectoris: Secondary | ICD-10-CM | POA: Diagnosis not present

## 2024-05-15 DIAGNOSIS — H43813 Vitreous degeneration, bilateral: Secondary | ICD-10-CM | POA: Diagnosis not present

## 2024-05-15 DIAGNOSIS — R911 Solitary pulmonary nodule: Secondary | ICD-10-CM

## 2024-05-15 DIAGNOSIS — D3131 Benign neoplasm of right choroid: Secondary | ICD-10-CM | POA: Diagnosis not present

## 2024-05-15 DIAGNOSIS — H353124 Nonexudative age-related macular degeneration, left eye, advanced atrophic with subfoveal involvement: Secondary | ICD-10-CM | POA: Diagnosis not present

## 2024-05-15 DIAGNOSIS — E119 Type 2 diabetes mellitus without complications: Secondary | ICD-10-CM | POA: Diagnosis not present

## 2024-05-15 DIAGNOSIS — H35033 Hypertensive retinopathy, bilateral: Secondary | ICD-10-CM | POA: Diagnosis not present

## 2024-05-15 DIAGNOSIS — H353211 Exudative age-related macular degeneration, right eye, with active choroidal neovascularization: Secondary | ICD-10-CM | POA: Diagnosis not present

## 2024-05-15 DIAGNOSIS — H43393 Other vitreous opacities, bilateral: Secondary | ICD-10-CM | POA: Diagnosis not present

## 2024-05-15 DIAGNOSIS — I700 Atherosclerosis of aorta: Secondary | ICD-10-CM | POA: Diagnosis not present

## 2024-05-15 DIAGNOSIS — R918 Other nonspecific abnormal finding of lung field: Secondary | ICD-10-CM | POA: Diagnosis not present

## 2024-05-15 DIAGNOSIS — H353113 Nonexudative age-related macular degeneration, right eye, advanced atrophic without subfoveal involvement: Secondary | ICD-10-CM | POA: Diagnosis not present

## 2024-05-15 DIAGNOSIS — H353222 Exudative age-related macular degeneration, left eye, with inactive choroidal neovascularization: Secondary | ICD-10-CM | POA: Diagnosis not present

## 2024-05-15 LAB — HM DIABETES EYE EXAM

## 2024-05-19 ENCOUNTER — Encounter: Payer: Self-pay | Admitting: Internal Medicine

## 2024-05-19 ENCOUNTER — Ambulatory Visit (INDEPENDENT_AMBULATORY_CARE_PROVIDER_SITE_OTHER): Admitting: Internal Medicine

## 2024-05-19 ENCOUNTER — Ambulatory Visit (INDEPENDENT_AMBULATORY_CARE_PROVIDER_SITE_OTHER): Admitting: Podiatry

## 2024-05-19 ENCOUNTER — Encounter: Payer: Self-pay | Admitting: Podiatry

## 2024-05-19 ENCOUNTER — Ambulatory Visit: Payer: Self-pay | Admitting: Internal Medicine

## 2024-05-19 VITALS — BP 128/64 | HR 65 | Temp 97.7°F | Ht 61.0 in | Wt 148.0 lb

## 2024-05-19 DIAGNOSIS — G4733 Obstructive sleep apnea (adult) (pediatric): Secondary | ICD-10-CM

## 2024-05-19 DIAGNOSIS — Z7984 Long term (current) use of oral hypoglycemic drugs: Secondary | ICD-10-CM | POA: Diagnosis not present

## 2024-05-19 DIAGNOSIS — E1169 Type 2 diabetes mellitus with other specified complication: Secondary | ICD-10-CM | POA: Diagnosis not present

## 2024-05-19 DIAGNOSIS — F5101 Primary insomnia: Secondary | ICD-10-CM | POA: Diagnosis not present

## 2024-05-19 DIAGNOSIS — E785 Hyperlipidemia, unspecified: Secondary | ICD-10-CM | POA: Diagnosis not present

## 2024-05-19 DIAGNOSIS — R911 Solitary pulmonary nodule: Secondary | ICD-10-CM

## 2024-05-19 DIAGNOSIS — I10 Essential (primary) hypertension: Secondary | ICD-10-CM

## 2024-05-19 DIAGNOSIS — Z23 Encounter for immunization: Secondary | ICD-10-CM | POA: Diagnosis not present

## 2024-05-19 DIAGNOSIS — Z8542 Personal history of malignant neoplasm of other parts of uterus: Secondary | ICD-10-CM | POA: Diagnosis not present

## 2024-05-19 DIAGNOSIS — M79675 Pain in left toe(s): Secondary | ICD-10-CM | POA: Diagnosis not present

## 2024-05-19 DIAGNOSIS — J4520 Mild intermittent asthma, uncomplicated: Secondary | ICD-10-CM | POA: Diagnosis not present

## 2024-05-19 DIAGNOSIS — B351 Tinea unguium: Secondary | ICD-10-CM

## 2024-05-19 DIAGNOSIS — M79674 Pain in right toe(s): Secondary | ICD-10-CM

## 2024-05-19 DIAGNOSIS — I700 Atherosclerosis of aorta: Secondary | ICD-10-CM

## 2024-05-19 LAB — COMPREHENSIVE METABOLIC PANEL WITH GFR
ALT: 16 U/L (ref 0–35)
AST: 18 U/L (ref 0–37)
Albumin: 4.3 g/dL (ref 3.5–5.2)
Alkaline Phosphatase: 66 U/L (ref 39–117)
BUN: 23 mg/dL (ref 6–23)
CO2: 26 meq/L (ref 19–32)
Calcium: 9.4 mg/dL (ref 8.4–10.5)
Chloride: 105 meq/L (ref 96–112)
Creatinine, Ser: 1.09 mg/dL (ref 0.40–1.20)
GFR: 48.23 mL/min — ABNORMAL LOW (ref >60.00)
Glucose, Bld: 128 mg/dL — ABNORMAL HIGH (ref 70–99)
Potassium: 4 meq/L (ref 3.5–5.1)
Sodium: 141 meq/L (ref 135–145)
Total Bilirubin: 0.5 mg/dL (ref 0.2–1.2)
Total Protein: 6.8 g/dL (ref 6.0–8.3)

## 2024-05-19 LAB — LIPID PANEL
Cholesterol: 143 mg/dL (ref 0–200)
HDL: 44.3 mg/dL (ref >39.00)
LDL Cholesterol: 58 mg/dL (ref 0–99)
NonHDL: 98.41
Total CHOL/HDL Ratio: 3
Triglycerides: 201 mg/dL — ABNORMAL HIGH (ref 0.0–149.0)
VLDL: 40.2 mg/dL — ABNORMAL HIGH (ref 0.0–40.0)

## 2024-05-19 LAB — CBC
HCT: 38.5 % (ref 36.0–46.0)
Hemoglobin: 12.8 g/dL (ref 12.0–15.0)
MCHC: 33.3 g/dL (ref 30.0–36.0)
MCV: 80.2 fl (ref 78.0–100.0)
Platelets: 203 10*3/uL (ref 150.0–400.0)
RBC: 4.8 Mil/uL (ref 3.87–5.11)
RDW: 16.6 % — ABNORMAL HIGH (ref 11.5–15.5)
WBC: 6.1 10*3/uL (ref 4.0–10.5)

## 2024-05-19 LAB — HEMOGLOBIN A1C: Hgb A1c MFr Bld: 6.8 % — ABNORMAL HIGH (ref 4.6–6.5)

## 2024-05-19 LAB — MICROALBUMIN / CREATININE URINE RATIO
Creatinine,U: 81.3 mg/dL
Microalb Creat Ratio: 14 mg/g (ref 0.0–30.0)
Microalb, Ur: 1.1 mg/dL (ref 0.0–1.9)

## 2024-05-19 LAB — TSH: TSH: 2.85 u[IU]/mL (ref 0.35–5.50)

## 2024-05-19 NOTE — Progress Notes (Signed)
   Subjective:   Patient ID: Amber Roman, female    DOB: 24-Oct-1946, 78 y.o.   MRN: [id number]  HPI The patient is a 78 YO female coming in for medical management (see A/P for details).   Review of Systems  Constitutional: Negative.   HENT: Negative.    Eyes: Negative.   Respiratory:  Negative for cough, chest tightness and shortness of breath.   Cardiovascular:  Negative for chest pain, palpitations and leg swelling.  Gastrointestinal:  Negative for abdominal distention, abdominal pain, constipation, diarrhea, nausea and vomiting.  Musculoskeletal: Negative.   Skin: Negative.   Neurological: Negative.   Psychiatric/Behavioral: Negative.      Objective:  Physical Exam Constitutional:      Appearance: She is well-developed.  HENT:     Head: Normocephalic and atraumatic.  Cardiovascular:     Rate and Rhythm: Normal rate and regular rhythm.  Pulmonary:     Effort: Pulmonary effort is normal. No respiratory distress.     Breath sounds: Normal breath sounds. No wheezing or rales.  Abdominal:     General: Bowel sounds are normal. There is no distension.     Palpations: Abdomen is soft.     Tenderness: There is no abdominal tenderness. There is no rebound.  Musculoskeletal:     Cervical back: Normal range of motion.  Skin:    General: Skin is warm and dry.     Comments: Foot exam done  Neurological:     Mental Status: She is alert and oriented to person, place, and time.     Coordination: Coordination normal.     Vitals:   05/19/24 1005  BP: 128/64  Pulse: 65  Temp: 97.7 F (36.5 C)  TempSrc: Temporal  SpO2: 97%  Weight: 148 lb (67.1 kg)  Height: 5\' 1"  (1.549 m)    Assessment & Plan:  Prevnar 20 given at visit

## 2024-05-19 NOTE — Assessment & Plan Note (Signed)
Taking crestor and will continue.

## 2024-05-19 NOTE — Assessment & Plan Note (Signed)
 BP at goal on irbesartan /hydrochlorothiazide  and metoprolol . Checking CMP and adjust as needed.

## 2024-05-19 NOTE — Assessment & Plan Note (Signed)
 Recent scan not resulted yet. Will wait read.

## 2024-05-19 NOTE — Assessment & Plan Note (Signed)
 Will increase gabapentin  to 600 mg at bedtime and keep trazodone . If not effective can adjust dosing.

## 2024-05-19 NOTE — Assessment & Plan Note (Signed)
 Uses nightly and obtains benefit.

## 2024-05-19 NOTE — Assessment & Plan Note (Signed)
 No flare today and using symbicort  and albuterol  prn.

## 2024-05-19 NOTE — Progress Notes (Signed)
  Subjective:  Patient ID: Carlo Chessman, female    DOB: 06/22/1946,  MRN: [id number]  Chief Complaint  Patient presents with   [facility]    [facility] toe nail trim. A1C 6.7. 0 pain.    78 y.o. female presents with the above complaint. History confirmed with patient.  She is doing well the nails have thickened and elongated again, previous debridements have been helpful in reducing pain and improving function.  No other new issues  Objective:  Physical Exam: warm, good capillary refill, no trophic changes or ulcerative lesions, normal DP and PT pulses, and normal sensory exam. Left Foot: dystrophic yellowed discolored nail plates with subungual debris Right Foot: dystrophic yellowed discolored nail plates with subungual debris  Assessment:   1. Pain due to onychomycosis of toenails of both feet      Plan:  Patient was evaluated and treated and all questions answered.   Discussed the etiology and treatment options for the condition in detail with the patient. Recommended debridement of the nails today. Sharp and mechanical debridement performed of all painful and mycotic nails today. Nails debrided in length and thickness using a nail nipper to level of comfort. Discussed treatment options including appropriate shoe gear. Follow up as needed for painful nails.   Return in about 10 weeks (around 07/28/2024) for at risk diabetic foot care.

## 2024-05-19 NOTE — Assessment & Plan Note (Signed)
 Foot exam done. Checking UACR and HGA1c and lipid panel and CMP and increasing gabapentin  to 600 mg at bedtime for neuropathy. Taking metformin  and on ARB and statin. Adjust as needed.

## 2024-05-19 NOTE — Assessment & Plan Note (Signed)
Checking lipid panel and adjust as needed crestor.

## 2024-05-19 NOTE — Assessment & Plan Note (Signed)
 S/p hysterectomy.

## 2024-05-21 DIAGNOSIS — Z1231 Encounter for screening mammogram for malignant neoplasm of breast: Secondary | ICD-10-CM | POA: Diagnosis not present

## 2024-05-21 LAB — HM MAMMOGRAPHY

## 2024-05-22 ENCOUNTER — Encounter: Payer: Self-pay | Admitting: Internal Medicine

## 2024-05-28 ENCOUNTER — Telehealth: Payer: Self-pay

## 2024-05-28 DIAGNOSIS — R918 Other nonspecific abnormal finding of lung field: Secondary | ICD-10-CM

## 2024-05-28 NOTE — Telephone Encounter (Signed)
 CT chest screening- several small lung nodules have been stable over 2 years, with nothing new, and are considered radiologically benign.

## 2024-05-28 NOTE — Telephone Encounter (Signed)
 Copied from CRM [id number]. Topic: Clinical - Lab/Test Results >> May 27, 2024  9:28 AM Amber Roman wrote: Reason for CRM: Patient called regarding CT scan results.  Spoke with patient regarding prior message . Advised patient the result's in the CT scan have been recently resulted on 05/23/2024. Advised patient iw ill send a message over to Dr.Young to see if he can read the CT results' Advised patient one we get the result's someone from our office will contact her.  Dr.Young can you please advise  Thank you

## 2024-05-29 ENCOUNTER — Telehealth: Payer: Self-pay | Admitting: *Deleted

## 2024-05-29 DIAGNOSIS — H353113 Nonexudative age-related macular degeneration, right eye, advanced atrophic without subfoveal involvement: Secondary | ICD-10-CM | POA: Diagnosis not present

## 2024-05-29 NOTE — Telephone Encounter (Signed)
 Copied from CRM [id number]. Topic: Clinical - Lab/Test Results >> May 27, 2024  9:28 AM Amber Roman wrote: Reason for CRM: Patient called regarding CT scan results.   duplicate

## 2024-05-29 NOTE — Telephone Encounter (Signed)
 Copied from CRM [id number]. Topic: Clinical - Lab/Test Results >> May 27, 2024  9:28 AM Corean Deutscher wrote: Reason for CRM: Patient called regarding CT scan results.  Spoke with patient regarding prior message of CT result's  CT chest screening- several small lung nodules have been stable over 2 years, with nothing new, and are considered radiologically benign.   Patient would like to know when you would want patient to have the next CT scan done .  Dr.Young can you please advise   Thank you

## 2024-05-29 NOTE — Telephone Encounter (Signed)
 Copied from CRM [id number]. Topic: Clinical - Lab/Test Results >> May 27, 2024  9:28 AM Corean Deutscher wrote: Reason for CRM: Patient called regarding CT scan results.  ATC z1 LVM for patient to call our office back for CT scan result's.

## 2024-05-29 NOTE — Telephone Encounter (Signed)
 Order CT chest no contrast future February, 2026     dx multiple lung nodules

## 2024-05-30 ENCOUNTER — Ambulatory Visit: Payer: Self-pay | Admitting: Nurse Practitioner

## 2024-05-30 NOTE — Telephone Encounter (Signed)
 Spoke and let patient know Dr.Young would like for patient to have a CT chest no contrast future February, 2026   Patient's voice was understanding.Nothing else further needed.

## 2024-05-31 ENCOUNTER — Other Ambulatory Visit: Payer: Self-pay | Admitting: Internal Medicine

## 2024-06-27 ENCOUNTER — Ambulatory Visit: Admitting: Neurology

## 2024-06-27 DIAGNOSIS — G4733 Obstructive sleep apnea (adult) (pediatric): Secondary | ICD-10-CM | POA: Diagnosis not present

## 2024-07-10 NOTE — Progress Notes (Signed)
 [facility]  [place] Amber Roman 78 year old female 04-19-1946   HOME SLEEP TEST REPORT ( by Elene  mail -out device )    Study Protocol:     The SANSA single-point-of-skin-contact chest-worn sensor - an FDA cleared and DOT approved type 4 home sleep test device - measures eight physiological channels,  including blood oxygen saturation (measured via PPG [photoplethysmography]), EKG-derived heart rate, respiratory effort, chest movement (measured via accelerometer), snoring, body position, and actigraphy. The device is designed to be worn for up to 10 hours per study.    STUDY DATE:  07-01-2024 Data received : 07-10-2024 , 4.05 PM   ORDERING CLINICIAN: Dedra Gores, MD  REFERRING CLINICIAN: Duwaine Russell, NP    CLINICAL INFORMATION/HISTORY: DM, HTN with retinopathy, depression.  Current CPAP user at 7 cm water,  residual AHI 1.9/h and highly compliant.   05/13/24:   Amber Roman is a 78 y.o. female with a history of OSA on CPAP and neuropathy. Returns today for follow-up.  Reports that her CPAP is working well.  She does state that this is the only CPAP she has had.  Her last sleep study was in 2015.  She does feel that over time her balance has progressively gotten worse.  Denies any sudden changes.  She feels that this is due to macular degeneration.  She does state that her macular degeneration has gotten worse.  In regards to her neuropathy she feels that it is relatively stable.  She reports that it is only in the feet.  Right foot is worse than the left.  She describes burning and tingling pain primarily that affects her at bedtime.  She is on Cymbalta    Epworth sleepiness score: 0 /24. FFS at   x/ 63 points   BMI: 28 kg/m  Neck Circumference: NA    Sleep Summary:   Total Recording Time (hours, min):    9 hours 53 minutes   Total Sleep Time (hours, min): 6 hours 46 minutes Sleep efficiency %;    69%  Sleep latency 15 minutes,  sleep architecture :  highly fragmented.   Respiratory Indices by CMS criteria of scoring;    Calculated pAHI (per hour): 10.3/h, if following AASM criteria this AHI would have been 18.1/h.                                               Positional  respiratory activity  / snoring : The patient mostly rested in supine position for a brief period of time on her left side.   The device captured almost no snoring.  Oxygen Saturation  in Sleep    Oxygen Saturation (%) Mean: The mean oxygenation was 91.6% between a nadir at 80.8% and a maximum saturation of 97.7%.  There were 32 minutes of total time in oxygen desaturation during sleep.                    Pulse Rate in Sleep :   Pulse Mean (bpm):     The mean heart rate was 68 beats per minute, and varied between a minimum heart rate of 60 and a maximum of 80 bpm . Cardiac Rhythm -  regular rhythm.  IMPRESSION:  This HST confirms the presence of obstructive sleep apnea at a mild degree when following CMS criteria.  The patient has associated hypoxemia which is significant as it was present for over 32 minutes.  Snoring was not detected.   The high fragmentation of sleep architecture can be an indication that the patient is CPAP dependent, but fragmented sleep can also be caused by painful conditions, discomfort, external interruptions of sleep.SABRA   RECOMMENDATION: The patient should continue her positive airway pressure therapy and a new device will be prepared for her.  This will be a ResMed auto titration CPAP device with a setting between 5 and 10 cm water pressure with 1 cm EPR, heated humidification and the mask or interface that the patient prefers.  A follow-up with nurse practitioner Megan Milliken will be scheduled within the next 90 days.   Any Patient endorsing a high level of sleepiness should be cautioned not to drive, work at heights, or operate dangerous machinery or heavy equipment when tired or sleepy.  Review of good sleep  hygiene measures took place in the initial consultation but should be revisited ( Your guide to better sleep  a publication by the NIH is a good source of information).   The referring provider will be notified of the test results.    I certify that I have reviewed the raw data recording prior to the issuance of this report in accordance with the standards of the American Academy of Sleep Medicine (AASM).    INTERPRETING PHYSICIAN:   Dedra Gores, MD  [facility] Board certified by The [employer] of Sleep Medicine and Diplomate of the [employer] of Sleep Medicine. Board certified In Neurology through the ABPN, Fellow of the [employer] of Neurology.

## 2024-07-14 DIAGNOSIS — H43813 Vitreous degeneration, bilateral: Secondary | ICD-10-CM | POA: Diagnosis not present

## 2024-07-14 DIAGNOSIS — H353113 Nonexudative age-related macular degeneration, right eye, advanced atrophic without subfoveal involvement: Secondary | ICD-10-CM | POA: Diagnosis not present

## 2024-07-14 DIAGNOSIS — E119 Type 2 diabetes mellitus without complications: Secondary | ICD-10-CM | POA: Diagnosis not present

## 2024-07-14 DIAGNOSIS — H353211 Exudative age-related macular degeneration, right eye, with active choroidal neovascularization: Secondary | ICD-10-CM | POA: Diagnosis not present

## 2024-07-14 DIAGNOSIS — D3131 Benign neoplasm of right choroid: Secondary | ICD-10-CM | POA: Diagnosis not present

## 2024-07-14 DIAGNOSIS — H353222 Exudative age-related macular degeneration, left eye, with inactive choroidal neovascularization: Secondary | ICD-10-CM | POA: Diagnosis not present

## 2024-07-14 DIAGNOSIS — H43393 Other vitreous opacities, bilateral: Secondary | ICD-10-CM | POA: Diagnosis not present

## 2024-07-14 DIAGNOSIS — H35033 Hypertensive retinopathy, bilateral: Secondary | ICD-10-CM | POA: Diagnosis not present

## 2024-07-14 DIAGNOSIS — H353124 Nonexudative age-related macular degeneration, left eye, advanced atrophic with subfoveal involvement: Secondary | ICD-10-CM | POA: Diagnosis not present

## 2024-07-16 ENCOUNTER — Other Ambulatory Visit: Payer: Self-pay | Admitting: Internal Medicine

## 2024-07-17 ENCOUNTER — Telehealth: Payer: Self-pay

## 2024-07-17 DIAGNOSIS — H353113 Nonexudative age-related macular degeneration, right eye, advanced atrophic without subfoveal involvement: Secondary | ICD-10-CM | POA: Diagnosis not present

## 2024-07-17 NOTE — Telephone Encounter (Signed)
 Patient is calling about the results of her HST. She is needing a new machine. Results are ready for MD to read.

## 2024-07-18 DIAGNOSIS — M25562 Pain in left knee: Secondary | ICD-10-CM | POA: Diagnosis not present

## 2024-07-18 DIAGNOSIS — M25561 Pain in right knee: Secondary | ICD-10-CM | POA: Diagnosis not present

## 2024-07-18 NOTE — Procedures (Signed)
 [facility]  Rock Amber Roman 78 year old female [birth year]   HOME SLEEP TEST REPORT ( by Elene  mail -out device )    Study Protocol:     The SANSA single-point-of-skin-contact chest-worn sensor - an FDA cleared and DOT approved type 4 home sleep test device - measures eight physiological channels,  including blood oxygen saturation (measured via PPG [photoplethysmography]), EKG-derived heart rate, respiratory effort, chest movement (measured via accelerometer), snoring, body position, and actigraphy. The device is designed to be worn for up to 10 hours per study.    STUDY DATE:  07-01-2024 Data received : 07-10-2024 , 4.05 PM   ORDERING CLINICIAN: Dedra Gores, MD  REFERRING CLINICIAN: Duwaine Russell, NP    CLINICAL INFORMATION/HISTORY: DM, HTN with retinopathy, depression.  Current CPAP user at 7 cm water,  residual AHI 1.9/h and highly compliant.   05/13/24:   Amber Roman is a 78 y.o. female with a history of OSA on CPAP and neuropathy. Returns today for follow-up.  Reports that her CPAP is working well.  She does state that this is the only CPAP she has had.  Her last sleep study was in 2015.  She does feel that over time her balance has progressively gotten worse.  Denies any sudden changes.  She feels that this is due to macular degeneration.  She does state that her macular degeneration has gotten worse.  In regards to her neuropathy she feels that it is relatively stable.  She reports that it is only in the feet.  Right foot is worse than the left.  She describes burning and tingling pain primarily that affects her at bedtime.  She is on Cymbalta    Epworth sleepiness score: 0 /24. FFS at   x/ 63 points   BMI: 28 kg/m  Neck Circumference: NA    Sleep Summary:   Total Recording Time (hours, min):    9 hours 53 minutes   Total Sleep Time (hours, min): 6 hours 46 minutes Sleep efficiency %;    69%  Sleep latency 15 minutes,  sleep architecture :  highly fragmented.   Respiratory Indices by CMS criteria of scoring;    Calculated pAHI (per hour): 10.3/h, if following AASM criteria this AHI would have been 18.1/h.                                               Positional  respiratory activity  / snoring : The patient mostly rested in supine position for a brief period of time on her left side.   The device captured almost no snoring.  Oxygen Saturation  in Sleep    Oxygen Saturation (%) Mean: The mean oxygenation was 91.6% between a nadir at 80.8% and a maximum saturation of 97.7%.  There were 32 minutes of total time in oxygen desaturation during sleep.                    Pulse Rate in Sleep :   Pulse Mean (bpm):     The mean heart rate was 68 beats per minute, and varied between a minimum heart rate of 60 and a maximum of 80 bpm . Cardiac Rhythm -  regular rhythm.  IMPRESSION:  This HST confirms the presence of obstructive sleep apnea at a mild degree when following CMS criteria.  The patient has associated hypoxemia which is significant as it was present for over 32 minutes.  Snoring was not detected.   The high fragmentation of sleep architecture can be an indication that the patient is CPAP dependent, but fragmented sleep can also be caused by painful conditions, discomfort, external interruptions of sleep.Amber Roman   RECOMMENDATION: The patient should continue her positive airway pressure therapy and a new device will be prepared for her.  This will be a ResMed auto titration CPAP device with a setting between 5 and 10 cm water pressure with 1 cm EPR, heated humidification and the mask or interface that the patient prefers.  A follow-up with nurse practitioner Megan Milliken will be scheduled within the next 90 days.   Any Patient endorsing a high level of sleepiness should be cautioned not to drive, work at heights, or operate dangerous machinery or heavy equipment when tired or sleepy.  Review of good sleep  hygiene measures took place in the initial consultation but should be revisited ( Your guide to better sleep  a publication by the NIH is a good source of information).   The referring provider will be notified of the test results.    I certify that I have reviewed the raw data recording prior to the issuance of this report in accordance with the standards of the American Academy of Sleep Medicine (AASM).    INTERPRETING PHYSICIAN:   Dedra Gores, MD  [facility] and [facility] Sleep Board certified by The [employer] of Sleep Medicine and Diplomate of the [employer] of Sleep Medicine. Board certified In Neurology through the ABPN, Fellow of the [employer] of Neurology.

## 2024-07-21 ENCOUNTER — Telehealth: Payer: Self-pay | Admitting: Adult Health

## 2024-07-21 ENCOUNTER — Ambulatory Visit: Payer: Self-pay | Admitting: Adult Health

## 2024-07-21 NOTE — Telephone Encounter (Signed)
 Will be in touch as soon as NP reviews sleep study results and next steps.

## 2024-07-21 NOTE — Telephone Encounter (Signed)
 Pt is asking to be called with the status of when she will get her new CPAP

## 2024-07-22 ENCOUNTER — Ambulatory Visit: Admitting: Podiatry

## 2024-07-22 DIAGNOSIS — D225 Melanocytic nevi of trunk: Secondary | ICD-10-CM | POA: Diagnosis not present

## 2024-07-22 DIAGNOSIS — Z1283 Encounter for screening for malignant neoplasm of skin: Secondary | ICD-10-CM | POA: Diagnosis not present

## 2024-07-23 NOTE — Telephone Encounter (Signed)
-----   Message from Amber Roman sent at 07/21/2024 12:55 PM EDT ----- Dr. Lionell already placed an order for new CPAP machine.  Please send to DME ----- Message ----- From: Chalice Saunas, MD Sent: 07/18/2024   7:10 PM EDT To: Amber Russell, NP

## 2024-07-23 NOTE — Telephone Encounter (Signed)
 I called pt and LMVM for her of the mild OSA on last sleep study.  Continue on pap therapy new order placed. Will [facility] to [facility]/used to be [facility].  Needs appt 2-3 months once she starts using new machine.  She is to all back if questions. Community message to [facility].

## 2024-07-28 NOTE — Telephone Encounter (Signed)
 RE: new PAP machine Received: 3 days ago New, Amber Neysa Nena GORMAN, RN; Amber Roman; 1 other Received, thank you!     Previous Messages    ----- Message ----- From: Neysa Nena GORMAN, RN Sent: 07/23/2024  10:33 AM EDT To: Amber Amber; Roman Jackson; Ephraim Viktoria* Subject: new PAP machine                                See new order in epic for pt  Amber Roman Female, 78 y.o., 1946-06-07 Pronouns: she/her/hers MRN: [id number] Phone: [phone]   Thank you,  [place]

## 2024-08-14 ENCOUNTER — Ambulatory Visit (INDEPENDENT_AMBULATORY_CARE_PROVIDER_SITE_OTHER): Admitting: Podiatry

## 2024-08-14 VITALS — Ht 61.0 in | Wt 148.0 lb

## 2024-08-14 DIAGNOSIS — M79674 Pain in right toe(s): Secondary | ICD-10-CM | POA: Diagnosis not present

## 2024-08-14 DIAGNOSIS — B351 Tinea unguium: Secondary | ICD-10-CM | POA: Diagnosis not present

## 2024-08-14 DIAGNOSIS — M79675 Pain in left toe(s): Secondary | ICD-10-CM | POA: Diagnosis not present

## 2024-08-17 NOTE — Progress Notes (Signed)
  Subjective:  Patient ID: Amber Roman, female    DOB: 01-05-1946,  MRN: [id number]  Chief Complaint  Patient presents with   Diabetes    Rm 19 Patient is here for diabetic foot care and nail trimming.    78 y.o. female presents with the above complaint. History confirmed with patient.  She is doing well the nails have thickened and elongated again, previous debridements have been helpful in reducing pain and improving function.  No other new issues.  She would like to consider total permanent removal of all her toenails  Objective:  Physical Exam: warm, good capillary refill, no trophic changes or ulcerative lesions, normal DP and PT pulses, and normal sensory exam. Left Foot: dystrophic yellowed discolored nail plates with subungual debris Right Foot: dystrophic yellowed discolored nail plates with subungual debris  Assessment:   1. Pain due to onychomycosis of toenails of both feet      Plan:  Patient was evaluated and treated and all questions answered.   Discussed the etiology and treatment options for the condition in detail with the patient. Recommended debridement of the nails today. Sharp and mechanical debridement performed of all painful and mycotic nails today. Nails debrided in length and thickness using a nail nipper to level of comfort. Discussed treatment options including appropriate shoe gear. Follow up as needed for painful nails.   Discussed permanent total matricectomy of her affected toenails.  Will do this at her convenience 1 foot at a time.  Follow-up with me as needed for this otherwise in 3 months.    Return in about 10 weeks (around 10/23/2024) for at risk diabetic foot care. 1

## 2024-08-20 DIAGNOSIS — M1711 Unilateral primary osteoarthritis, right knee: Secondary | ICD-10-CM | POA: Diagnosis not present

## 2024-08-25 DIAGNOSIS — H353222 Exudative age-related macular degeneration, left eye, with inactive choroidal neovascularization: Secondary | ICD-10-CM | POA: Diagnosis not present

## 2024-08-25 DIAGNOSIS — H43393 Other vitreous opacities, bilateral: Secondary | ICD-10-CM | POA: Diagnosis not present

## 2024-08-25 DIAGNOSIS — H353113 Nonexudative age-related macular degeneration, right eye, advanced atrophic without subfoveal involvement: Secondary | ICD-10-CM | POA: Diagnosis not present

## 2024-08-25 DIAGNOSIS — H353211 Exudative age-related macular degeneration, right eye, with active choroidal neovascularization: Secondary | ICD-10-CM | POA: Diagnosis not present

## 2024-08-25 DIAGNOSIS — D3131 Benign neoplasm of right choroid: Secondary | ICD-10-CM | POA: Diagnosis not present

## 2024-08-25 DIAGNOSIS — E119 Type 2 diabetes mellitus without complications: Secondary | ICD-10-CM | POA: Diagnosis not present

## 2024-08-25 DIAGNOSIS — H43813 Vitreous degeneration, bilateral: Secondary | ICD-10-CM | POA: Diagnosis not present

## 2024-08-25 DIAGNOSIS — H353124 Nonexudative age-related macular degeneration, left eye, advanced atrophic with subfoveal involvement: Secondary | ICD-10-CM | POA: Diagnosis not present

## 2024-08-25 DIAGNOSIS — H35033 Hypertensive retinopathy, bilateral: Secondary | ICD-10-CM | POA: Diagnosis not present

## 2024-08-28 DIAGNOSIS — H353113 Nonexudative age-related macular degeneration, right eye, advanced atrophic without subfoveal involvement: Secondary | ICD-10-CM | POA: Diagnosis not present

## 2024-08-29 ENCOUNTER — Telehealth: Payer: Self-pay | Admitting: Radiology

## 2024-08-29 ENCOUNTER — Other Ambulatory Visit: Payer: Self-pay | Admitting: Internal Medicine

## 2024-08-29 NOTE — Telephone Encounter (Signed)
 Pharmacy on file

## 2024-08-29 NOTE — Telephone Encounter (Signed)
 Copied from CRM #[id number]. Topic: Clinical - Medication Question >> Aug 29, 2024  2:29 PM Rea ORN wrote: Reason for CRM: Covid rx request Brand Name: Pfizter Pharmacy: [id number] - [place], [place] - [address] [facility] [address]

## 2024-09-01 DIAGNOSIS — Z23 Encounter for immunization: Secondary | ICD-10-CM | POA: Diagnosis not present

## 2024-09-01 NOTE — Telephone Encounter (Signed)
 Should be able to go to pharmacy with new standing orders from governor

## 2024-09-02 NOTE — Telephone Encounter (Signed)
 Called patient back to relay the message and she stated that she has already had it done and this has been documented in her chart

## 2024-09-09 ENCOUNTER — Ambulatory Visit: Payer: Self-pay

## 2024-09-09 NOTE — Telephone Encounter (Signed)
 FYI Only or Action Required?: FYI only for provider.  Patient was last seen in primary care on 05/19/2024 by Rollene Almarie LABOR, MD.  Called Nurse Triage reporting Diarrhea.  Symptoms began a week ago.  Interventions attempted: Nothing.  Symptoms are: unchanged.  Triage Disposition: See Physician Within 24 Hours  Patient/caregiver understands and will follow disposition?: Yes Currently in [place] state   Copied from CRM #[id number]. Topic: Clinical - Red Word Triage >> Sep 09, 2024  9:28 AM Berneda FALCON wrote: Red Word that prompted transfer to Nurse Triage: Stomach virus-cannot hold down water or food for any length of time. No pain, no fever, headache or chills. Patient has extreme diarrhea for 6 days now. She is visiting a friend in [place]. Reason for Disposition  [1] MODERATE diarrhea (e.g., 4-6 times / day more than normal) AND [2] age > 70 years  Answer Assessment - Initial Assessment Questions 1. DIARRHEA SEVERITY: How bad is the diarrhea? How many more stools have you had in the past 24 hours than normal?      4-5 times 2. ONSET: When did the diarrhea begin?      6 days ago, states ate something at a sub shop 3. STOOL DESCRIPTION:  How loose or watery is the diarrhea? What is the stool color? Is there any blood or mucous in the stool?     Loose stool 4. VOMITING: Are you also vomiting? If Yes, ask: How many times in the past 24 hours?      denies 5. ABDOMEN PAIN: Are you having any abdomen pain? If Yes, ask: What does it feel like? (e.g., crampy, dull, intermittent, constant)      denies 6. ABDOMEN PAIN SEVERITY: If present, ask: How bad is the pain?  (e.g., Scale 1-10; mild, moderate, or severe)     denies 7. ORAL INTAKE: If vomiting, Have you been able to drink liquids? How much liquids have you had in the past 24 hours?     Not vomiting 8. HYDRATION: Any signs of dehydration? (e.g., dry mouth [not just dry lips], too weak to stand, dizziness, new weight  loss) When did you last urinate?     denies 9. EXPOSURE: Have you traveled to a foreign country recently? Have you been exposed to anyone with diarrhea? Could you have eaten any food that was spoiled?     In [place] 10. ANTIBIOTIC USE: Are you taking antibiotics now or have you taken antibiotics in the past 2 months?       no 11. OTHER SYMPTOMS: Do you have any other symptoms? (e.g., fever, blood in stool)       no 12. PREGNANCY: Is there any chance you are pregnant? When was your last menstrual period?       na  Protocols used: [facility]

## 2024-09-18 ENCOUNTER — Ambulatory Visit: Admitting: Behavioral Health

## 2024-09-26 ENCOUNTER — Ambulatory Visit: Admitting: Behavioral Health

## 2024-10-07 DIAGNOSIS — Z7185 Encounter for immunization safety counseling: Secondary | ICD-10-CM | POA: Diagnosis not present

## 2024-10-07 DIAGNOSIS — Z23 Encounter for immunization: Secondary | ICD-10-CM | POA: Diagnosis not present

## 2024-10-15 ENCOUNTER — Other Ambulatory Visit: Payer: Self-pay | Admitting: Behavioral Health

## 2024-10-15 DIAGNOSIS — F330 Major depressive disorder, recurrent, mild: Secondary | ICD-10-CM

## 2024-10-15 DIAGNOSIS — H353211 Exudative age-related macular degeneration, right eye, with active choroidal neovascularization: Secondary | ICD-10-CM | POA: Diagnosis not present

## 2024-10-15 DIAGNOSIS — F411 Generalized anxiety disorder: Secondary | ICD-10-CM

## 2024-10-16 ENCOUNTER — Ambulatory Visit: Admitting: Behavioral Health

## 2024-10-21 ENCOUNTER — Ambulatory Visit: Payer: Self-pay | Admitting: Internal Medicine

## 2024-10-21 MED ORDER — PREDNISONE 10 MG PO TABS: ORAL_TABLET | ORAL | 0 refills | Status: AC

## 2024-10-21 MED ORDER — AZITHROMYCIN 250 MG PO TABS: ORAL_TABLET | ORAL | 0 refills | Status: DC

## 2024-10-21 NOTE — Telephone Encounter (Signed)
Dr. Maple Hudson, can you please advise?

## 2024-10-21 NOTE — Telephone Encounter (Signed)
 Offer Zpak  2 today then one daily              Prednisone  10 mg, # 20, 4 X 2 DAYS, 3 X 2 DAYS, 2 X 2 DAYS, 1 X 2 DAYS

## 2024-10-21 NOTE — Telephone Encounter (Signed)
 FYI Only or Action Required?: Action required by provider: update on patient condition and request for antibiotics for suspected bronchitis. Patient with macular degeneration/legally blind and cannot drive  Patient is followed in Pulmonology for asthma, last seen on 04/21/2024 by Neysa Reggy BIRCH, MD.  Called Nurse Triage reporting Shortness of Breath and Cough.  Symptoms began a week ago.  Interventions attempted: OTC medications: mucinex, Rescue inhaler, Maintenance inhaler, and Other: warm liquids.  Symptoms are: unchanged.  Triage Disposition: See Physician Within 24 Hours  Patient/caregiver understands and will follow disposition?: Unsure  Copied from CRM [id number]. Topic: Clinical - Red Word Triage >> Oct 21, 2024 11:01 AM Amber Roman wrote: Red Word that prompted transfer to Nurse Triage: chest tightness, low grade fever, SOB, pt legally blind, so cannot drive Reason for Disposition  Fever present > 3 days (72 hours)  Answer Assessment - Initial Assessment Questions 1. ONSET: When did the cough begin?      One week, worsening  3. SPUTUM: Describe the color of your sputum (e.g., none, dry cough; clear, white, yellow, green)     Light brown 4. HEMOPTYSIS: Are you coughing up any blood? If Yes, ask: How much? (e.g., flecks, streaks, tablespoons, etc.)     denies 5. DIFFICULTY BREATHING: Are you having difficulty breathing? If Yes, ask: How bad is it? (e.g., mild, moderate, severe)      Shortness of breath after cough or with activity 6. FEVER: Do you have a fever? If Yes, ask: What is your temperature, how was it measured, and when did it start?     Not measured, but thinks that she is running a low fever 7. CARDIAC HISTORY: Do you have any history of heart disease? (e.g., heart attack, congestive heart failure)      HTN 8. LUNG HISTORY: Do you have any history of lung disease?  (e.g., pulmonary embolus, asthma, emphysema)     Asthma, lung nodules 9. PE RISK  FACTORS: Do you have a history of blood clots? (or: recent major surgery, recent prolonged travel, bedridden)     N/a 10. OTHER SYMPTOMS: Do you have any other symptoms? (e.g., runny nose, wheezing, chest pain)       Runny nose  Protocols used: Cough - Acute Productive-A-AH

## 2024-10-21 NOTE — Telephone Encounter (Signed)
 I called and spoke to pt. Pt informed of Dr Saundra note and verbalized understanding. RX called in. NFN

## 2024-10-23 ENCOUNTER — Ambulatory Visit: Admitting: Behavioral Health

## 2024-10-23 ENCOUNTER — Ambulatory Visit: Attending: Cardiovascular Disease | Admitting: Cardiovascular Disease

## 2024-10-23 ENCOUNTER — Encounter: Payer: Self-pay | Admitting: Cardiovascular Disease

## 2024-10-23 ENCOUNTER — Encounter: Payer: Self-pay | Admitting: Behavioral Health

## 2024-10-23 VITALS — BP 138/60 | HR 69 | Ht 61.5 in | Wt 151.6 lb

## 2024-10-23 DIAGNOSIS — H43393 Other vitreous opacities, bilateral: Secondary | ICD-10-CM | POA: Diagnosis not present

## 2024-10-23 DIAGNOSIS — E1169 Type 2 diabetes mellitus with other specified complication: Secondary | ICD-10-CM | POA: Diagnosis not present

## 2024-10-23 DIAGNOSIS — I10 Essential (primary) hypertension: Secondary | ICD-10-CM | POA: Diagnosis not present

## 2024-10-23 DIAGNOSIS — E785 Hyperlipidemia, unspecified: Secondary | ICD-10-CM | POA: Diagnosis not present

## 2024-10-23 DIAGNOSIS — G4733 Obstructive sleep apnea (adult) (pediatric): Secondary | ICD-10-CM | POA: Diagnosis not present

## 2024-10-23 DIAGNOSIS — D3131 Benign neoplasm of right choroid: Secondary | ICD-10-CM | POA: Diagnosis not present

## 2024-10-23 DIAGNOSIS — H35033 Hypertensive retinopathy, bilateral: Secondary | ICD-10-CM | POA: Diagnosis not present

## 2024-10-23 DIAGNOSIS — E119 Type 2 diabetes mellitus without complications: Secondary | ICD-10-CM | POA: Diagnosis not present

## 2024-10-23 DIAGNOSIS — F330 Major depressive disorder, recurrent, mild: Secondary | ICD-10-CM | POA: Diagnosis not present

## 2024-10-23 DIAGNOSIS — H43813 Vitreous degeneration, bilateral: Secondary | ICD-10-CM | POA: Diagnosis not present

## 2024-10-23 DIAGNOSIS — H353124 Nonexudative age-related macular degeneration, left eye, advanced atrophic with subfoveal involvement: Secondary | ICD-10-CM | POA: Diagnosis not present

## 2024-10-23 DIAGNOSIS — F411 Generalized anxiety disorder: Secondary | ICD-10-CM

## 2024-10-23 DIAGNOSIS — H353211 Exudative age-related macular degeneration, right eye, with active choroidal neovascularization: Secondary | ICD-10-CM | POA: Diagnosis not present

## 2024-10-23 DIAGNOSIS — H353113 Nonexudative age-related macular degeneration, right eye, advanced atrophic without subfoveal involvement: Secondary | ICD-10-CM | POA: Diagnosis not present

## 2024-10-23 MED ORDER — DULOXETINE HCL 60 MG PO CPEP
60.0000 mg | ORAL_CAPSULE | Freq: Every day | ORAL | 4 refills | Status: AC
Start: 2024-10-23 — End: ?

## 2024-10-23 NOTE — Assessment & Plan Note (Addendum)
 History of essential hypertension with blood pressure measured today at 161/72.  This apparently is her third doctor's appointment today.  She is on Avalide and metoprolol .  Follow-up blood pressure at the end of the visit was 138/60.

## 2024-10-23 NOTE — Patient Instructions (Signed)

## 2024-10-23 NOTE — Progress Notes (Signed)
 10/23/2024 Amber Roman   01/02/1946  [id number]  Primary Physician Rollene Almarie LABOR, MD Primary Cardiologist: Dorn JINNY Lesches MD [facility] [place], [place]  HPI:  Amber Roman is a 78 y.o.   fit-appearing, divorced Caucasian female, mother of a 55 year old Vietnamese adopted daughter named Lamarr who she adopted when she was 78 years old and has graduated from [employer].  Her daughter just got married 12/24.  She currently lives in [place] and owns an indoor plant shop... I last saw her in the office 05/06/2022.  She has a history of hypertension, hyperlipidemia, and a strong family history for heart disease with a father who had his first MI at age 45 and died 20 years later of a massive myocardial infarction. Her last Myoview stress test performed 10/25/12 was not ischemic.She has never had a heart attack or stroke, otherwise is asymptomatic. Her other problems include uterine cancer having undergone a total abdominal hysterectomy without recurrence 12 years ago. Dr. Janey follows her lipid profile. She had an uncomplicated total hip replacement by Dr. Medford Poli 01/09/2013. Since I saw her back she denies chest pain but does complain of occasional shortness of breath on exertion. She had negative breathing studies by Dr. Neysa recently.  She was diagnosed obstructive sleep apnea by Dr. Russella and has been a remarkable responder to CPAP.   Since I saw her in the office 1 year ago she has remained stable.   She was also diagnosed with type 2 diabetes and was placed on metformin .  She denies chest pain or shortness of breath.    Current Meds  Medication Sig   albuterol  (PROAIR  HFA) 108 (90 Base) MCG/ACT inhaler INHALE 2 PUFFS INTO THE LUNGS EVERY 6 HOURS AS NEEDED FOR WHEEZING OR SHORTNESS OF BREATH   azelastine  (OPTIVAR ) 0.05 % ophthalmic solution Place 1 drop into both eyes 2 (two) times daily.   azithromycin  (ZITHROMAX ) 250 MG tablet Take 2 tablets  today then take 1 tablet daily  for 4 additional days until prescription is complete.   b complex vitamins capsule Take 1 capsule by mouth daily.   budesonide -formoterol  (SYMBICORT ) 160-4.5 MCG/ACT inhaler Inhale 2 puffs into the lungs 2 (two) times daily. Via AZ&ME pt assistance   diphenoxylate -atropine  (LOMOTIL ) 2.5-0.025 MG tablet Take 1 tablet by mouth 4 (four) times daily as needed. For diarrhea   DULoxetine  (CYMBALTA ) 60 MG capsule Take 1 capsule (60 mg total) by mouth daily.   fluticasone  (FLONASE ) 50 MCG/ACT nasal spray 1-2 puffs each nostril once daily while needed   gabapentin  (NEURONTIN ) 300 MG capsule TAKE 1 CAPSULE(300 MG) BY MOUTH IN THE MORNING AND AT BEDTIME   guaiFENesin (MUCINEX) 600 MG 12 hr tablet Take 1,200 mg by mouth 2 (two) times daily as needed. For chest tightness   ipratropium-albuterol  (DUONEB) 0.5-2.5 (3) MG/3ML SOLN Take 3 mLs by nebulization every 6 (six) hours as needed.   irbesartan -hydrochlorothiazide  (AVALIDE) 300-12.5 MG tablet TAKE 1 TABLET BY MOUTH DAILY   meloxicam  (MOBIC ) 15 MG tablet TAKE 1 TABLET(15 MG) BY MOUTH DAILY   metFORMIN  (GLUCOPHAGE ) 500 MG tablet TAKE 1 TABLET(500 MG) BY MOUTH DAILY WITH BREAKFAST   metoprolol  succinate (TOPROL -XL) 100 MG 24 hr tablet TAKE 1 TABLET(100 MG) BY MOUTH DAILY   Multiple Vitamin (MULTIVITAMIN) tablet Take 1 tablet by mouth daily.   Multiple Vitamins-Minerals (PRESERVISION AREDS PO) Take by mouth daily.   Multiple Vitamins-Minerals (VITAMIN D3 COMPLETE PO) Take 50 mcg by mouth daily.   Nebulizers (COMPRESSOR  NEBULIZER) MISC As directed   predniSONE  (DELTASONE ) 10 MG tablet Take 4 tablets (40 mg total) by mouth daily with breakfast for 2 days, THEN 3 tablets (30 mg total) daily with breakfast for 2 days, THEN 2 tablets (20 mg total) daily with breakfast for 2 days, THEN 1 tablet (10 mg total) daily with breakfast for 2 days.   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  Tobacco Use   Smoking status: Former    Current packs/day: 0.00    Average packs/day: 2.0 packs/day for 15.0 years (30.0 ttl pk-yrs)    Types: Cigarettes    Start date: 12/19/1963    Quit date: 12/18/1978    Years since quitting: 45.8    Passive exposure: Past   Smokeless tobacco: Never  Vaping Use   Vaping status: Never Used  Substance and Sexual Activity   Alcohol use: No    Comment: Sober for 36 years   Drug use: No    Comment: hx of marijuana use    Sexual activity: Not Currently  Other Topics Concern   Not on file  Social History Narrative   Patient is single.Patient has a college education.Patient drinks one caffeine drink per day.Patient is right-handed.Patient has one child.   Retired    Teacher, Early Years/pre Strain: Low Risk  (05/08/2024)   Overall Financial Resource Strain (CARDIA)    Difficulty of Paying Living Expenses: Not hard at all  Food Insecurity: No Food Insecurity (05/08/2024)   Hunger Vital Sign    Worried About Running Out of Food in the Last Year: Never true    Ran Out of Food in the Last Year: Never true  Transportation Needs: No Transportation Needs (05/08/2024)   PRAPARE - Administrator, Civil Service (Medical): No    Lack of Transportation (Non-Medical): No  Physical Activity: Sufficiently Active (05/08/2024)   Exercise Vital Sign    Days of Exercise per Week: 7 days    Minutes of Exercise per Session: 30 min  Stress: No Stress Concern Present (05/08/2024)   [employer] of Occupational Health - Occupational Stress Questionnaire    Feeling of Stress : Not at all  Social  Connections: Moderately Integrated (05/08/2024)   Social Connection and Isolation Panel    Frequency of Communication with Friends and Family: More than three times a week    Frequency of Social Gatherings with Friends and Family: More than three times a week    Attends Religious Services: More than 4 times per year    Active Member of [employer] or Organizations: Yes    Attends Banker Meetings: More than 4 times per year    Marital Status: Never married  Intimate Partner Violence: Not At Risk (05/08/2024)   Humiliation, Afraid, Rape, and Kick questionnaire    Fear of Current or Ex-Partner: No    Emotionally Abused: No    Physically Abused: No    Sexually Abused: No     Review of Systems: General:  negative for chills, fever, night sweats or weight changes.  Cardiovascular: negative for chest pain, dyspnea on exertion, edema, orthopnea, palpitations, paroxysmal nocturnal dyspnea or shortness of breath Dermatological: negative for rash Respiratory: negative for cough or wheezing Urologic: negative for hematuria Abdominal: negative for nausea, vomiting, diarrhea, bright red blood per rectum, melena, or hematemesis Neurologic: negative for visual changes, syncope, or dizziness All other systems reviewed and are otherwise negative except as noted above.    Blood pressure (!) 161/72, pulse 69, height 5' 1.5 (1.562 m), weight 151 lb 9.6 oz (68.8 kg), SpO2 94%.  General appearance: alert and no distress Neck: no adenopathy, no carotid bruit, no JVD, supple, symmetrical, trachea midline, and thyroid  not enlarged, symmetric, no tenderness/mass/nodules Lungs: clear to auscultation bilaterally Heart: regular rate and rhythm, S1, S2 normal, no murmur, click, rub or gallop Extremities: extremities normal, atraumatic, no cyanosis or edema Pulses: 2+ and symmetric Skin: Skin color, texture, turgor normal. No rashes or lesions Neurologic: Grossly normal  EKG EKG  Interpretation Date/Time:  Thursday October 23 2024 14:55:49 EST Ventricular Rate:  69 PR Interval:  158 QRS Duration:  84 QT Interval:  402 QTC Calculation: 430 R Axis:   77  Text Interpretation: Normal sinus rhythm Anterior infarct (cited on or before 25-Sep-2023) When compared with ECG of 25-Sep-2023 14:25, No significant change was found Confirmed by Court Carrier [phone]) on 10/23/2024 3:48:54 PM    ASSESSMENT AND PLAN:   Hyperlipidemia associated with type 2 diabetes mellitus (HCC) History of hyperlipidemia on rosuvastatin  with lipid profile performed 05/19/2024 revealing total cholesterol 143, LDL 58 and HDL of 44.  Essential hypertension History of essential hypertension with blood pressure measured today at 161/72.  This apparently is her third doctor's appointment today.  She is on Avalide and metoprolol .  Follow-up blood pressure at the end of the visit was 138/60.  OSA on CPAP On CPAP     Carrier DOROTHA Court MD [facility], [facility] 10/23/2024 3:56 PM

## 2024-10-23 NOTE — Assessment & Plan Note (Signed)
 History of hyperlipidemia on rosuvastatin  with lipid profile performed 05/19/2024 revealing total cholesterol 143, LDL 58 and HDL of 44.

## 2024-10-23 NOTE — Assessment & Plan Note (Signed)
 On CPAP. ?

## 2024-10-23 NOTE — Progress Notes (Signed)
 [facility]  Patient ID: MARVIN MAENZA,  MRN: [id number]  PCP: Rollene Almarie LABOR, MD  Date of Evaluation: 10/23/2024 Time spent:30 minutes  Chief Complaint:  Chief Complaint   Anxiety; Depression; Follow-up; Medication Refill; Patient Education     HISTORY/CURRENT STATUS: HPI Amber Roman, 78 year old female presents to this office for follow up and medication management.  Collateral information should be considered reliable.  He continues to report great stability on her current medication regimen.  She has continued with duloxetine  for over 20 years with success.  Requesting no medication changes or adjustments today.  Would like yearly follow-ups..  She reports anxiety and depression at 2/10.   She does suffer from progressive macular degeneration.  Reports sleeping well 7 to 8 hours per night.  She lives alone in [place] .  Denies mania, no psychosis, no auditory or visual hallucinations.  No delirium.  No SI or HI.   Past psychiatric medication trials: Paxil Lexapro   Individual Medical History/ Review of Systems: Changes? :No   Allergies: Sulfonamide derivatives  Current Medications:  Current Outpatient Medications:    albuterol  (PROAIR  HFA) 108 (90 Base) MCG/ACT inhaler, INHALE 2 PUFFS INTO THE LUNGS EVERY 6 HOURS AS NEEDED FOR WHEEZING OR SHORTNESS OF BREATH, Disp: 18 g, Rfl: 12   azelastine  (OPTIVAR ) 0.05 % ophthalmic solution, Place 1 drop into both eyes 2 (two) times daily., Disp: 6 mL, Rfl: 3   azithromycin  (ZITHROMAX ) 250 MG tablet, Take 2 tablets  today then take 1 tablet daily for 4 additional days until prescription is complete., Disp: 6 tablet, Rfl: 0   b complex vitamins capsule, Take 1 capsule by mouth daily., Disp: , Rfl:    budesonide -formoterol  (SYMBICORT ) 160-4.5 MCG/ACT inhaler, Inhale 2 puffs into the lungs 2 (two) times daily. Via [employer] pt assistance, Disp: 3 each, Rfl: 3   diphenoxylate -atropine  (LOMOTIL ) 2.5-0.025 MG tablet,  Take 1 tablet by mouth 4 (four) times daily as needed. For diarrhea, Disp: 30 tablet, Rfl: 0   DULoxetine  (CYMBALTA ) 60 MG capsule, Take 1 capsule (60 mg total) by mouth daily., Disp: 90 capsule, Rfl: 4   fluticasone  (FLONASE ) 50 MCG/ACT nasal spray, 1-2 puffs each nostril once daily while needed, Disp: 16 g, Rfl: prn   gabapentin  (NEURONTIN ) 300 MG capsule, TAKE 1 CAPSULE(300 MG) BY MOUTH IN THE MORNING AND AT BEDTIME, Disp: 180 capsule, Rfl: 3   guaiFENesin (MUCINEX) 600 MG 12 hr tablet, Take 1,200 mg by mouth 2 (two) times daily as needed. For chest tightness, Disp: , Rfl:    ipratropium-albuterol  (DUONEB) 0.5-2.5 (3) MG/3ML SOLN, Take 3 mLs by nebulization every 6 (six) hours as needed., Disp: 75 mL, Rfl: 12   irbesartan -hydrochlorothiazide  (AVALIDE) 300-12.5 MG tablet, TAKE 1 TABLET BY MOUTH DAILY, Disp: 90 tablet, Rfl: 0   meloxicam  (MOBIC ) 15 MG tablet, TAKE 1 TABLET(15 MG) BY MOUTH DAILY, Disp: 90 tablet, Rfl: 3   metFORMIN  (GLUCOPHAGE ) 500 MG tablet, TAKE 1 TABLET(500 MG) BY MOUTH DAILY WITH BREAKFAST, Disp: 90 tablet, Rfl: 3   metoprolol  succinate (TOPROL -XL) 100 MG 24 hr tablet, TAKE 1 TABLET(100 MG) BY MOUTH DAILY, Disp: 90 tablet, Rfl: 3   Multiple Vitamin (MULTIVITAMIN) tablet, Take 1 tablet by mouth daily., Disp: , Rfl:    Multiple Vitamins-Minerals (PRESERVISION AREDS PO), Take by mouth daily., Disp: , Rfl:    Multiple Vitamins-Minerals (VITAMIN D3 COMPLETE PO), Take 50 mcg by mouth daily., Disp: , Rfl:    Nebulizers (COMPRESSOR NEBULIZER) MISC, As directed, Disp: 1 each, Rfl:  0   predniSONE  (DELTASONE ) 10 MG tablet, Take 4 tablets (40 mg total) by mouth daily with breakfast for 2 days, THEN 3 tablets (30 mg total) daily with breakfast for 2 days, THEN 2 tablets (20 mg total) daily with breakfast for 2 days, THEN 1 tablet (10 mg total) daily with breakfast for 2 days., Disp: 20 tablet, Rfl: 0   rosuvastatin  (CRESTOR ) 20 MG tablet, TAKE 1 TABLET(20 MG) BY MOUTH DAILY, Disp: 90 tablet,  Rfl: 0 Medication Side Effects: none  Family Medical/ Social History: Changes? No  MENTAL HEALTH EXAM:  There were no vitals taken for this visit.There is no height or weight on file to calculate BMI.  General Appearance: Casual, Neat, and Well Groomed  Eye Contact:  Good  Speech:  Clear and Coherent  Volume:  Normal  Mood:  NA  Affect:  Appropriate  Thought Process:  Coherent  Orientation:  Full (Time, Place, and Person)  Thought Content: Logical   Suicidal Thoughts:  No  Homicidal Thoughts:  No  Memory:  WNL  Judgement:  Good  Insight:  Good  Psychomotor Activity:  Normal  Concentration:  Concentration: Good  Recall:  Good  Fund of Knowledge: Good  Language: Good  Assets:  Desire for Improvement  ADL's:  Intact  Cognition: WNL  Prognosis:  Good    DIAGNOSES:    ICD-10-CM   1. Generalized anxiety disorder  F41.1 DULoxetine  (CYMBALTA ) 60 MG capsule    2. Mild episode of recurrent major depressive disorder  F33.0 DULoxetine  (CYMBALTA ) 60 MG capsule      Receiving Psychotherapy: No    RECOMMENDATIONS:   Greater than 50% of 20 min face to face time with patient was spent on counseling and coordination of care.  Discussed her continued great stability with duloxetine  for 20+ years.  She is here today needing refills and requesting yearly follow-ups.  She has no questions or concerns today.   We agreed today to: To continue Cymbalta  60 mg daily. Will follow-up in 1 year to reassess Will report worsening symptoms or side effects promptly Provided emergency contact information number Reviewed PDMP   Redell DELENA Pizza, NP

## 2024-11-04 ENCOUNTER — Ambulatory Visit: Admitting: Podiatry

## 2024-11-06 ENCOUNTER — Ambulatory Visit (INDEPENDENT_AMBULATORY_CARE_PROVIDER_SITE_OTHER): Admitting: Podiatry

## 2024-11-06 VITALS — Ht 61.5 in | Wt 151.6 lb

## 2024-11-06 DIAGNOSIS — M79674 Pain in right toe(s): Secondary | ICD-10-CM | POA: Diagnosis not present

## 2024-11-06 DIAGNOSIS — L600 Ingrowing nail: Secondary | ICD-10-CM

## 2024-11-06 DIAGNOSIS — B351 Tinea unguium: Secondary | ICD-10-CM

## 2024-11-06 DIAGNOSIS — M79675 Pain in left toe(s): Secondary | ICD-10-CM

## 2024-11-06 MED ORDER — NEOMYCIN-POLYMYXIN-HC 3.5-10000-1 OT SUSP: OTIC | 0 refills | Status: AC

## 2024-11-06 NOTE — Patient Instructions (Signed)

## 2024-11-06 NOTE — Progress Notes (Signed)
  Subjective:  Patient ID: Amber Roman, female    DOB: [birth year],  MRN: [id number]  Chief Complaint  Patient presents with   Diabetes    RM   [facility]. Ingrown of the left hallux ( medial border).    78 y.o. female presents with the above complaint. History confirmed with patient.  She is doing well the nails have thickened and elongated again, previous debridements have been helpful in reducing pain and improving function.  Left great toenail has been very painful  Objective:  Physical Exam: warm, good capillary refill, no trophic changes or ulcerative lesions, normal DP and PT pulses, and normal sensory exam. Left Foot: dystrophic yellowed discolored nail plates with subungual debris, incurvated left hallux medial and lateral Right Foot: dystrophic yellowed discolored nail plates with subungual debris  Assessment:   1. Ingrowing left great toenail   2. Pain due to onychomycosis of toenails of both feet      Plan:  Patient was evaluated and treated and all questions answered.   Discussed the etiology and treatment options for the condition in detail with the patient. Recommended debridement of the nails today. Sharp and mechanical debridement performed of all painful and mycotic nails today. Nails debrided in length and thickness using a nail nipper to level of comfort. Discussed treatment options including appropriate shoe gear. Follow up as needed for painful nails.     Ingrown Nail, left -Patient elects to proceed with minor surgery to remove ingrown toenail today. Consent reviewed and signed by patient. -Ingrown nail excised. See procedure note. -Educated on post-procedure care including soaking. Written instructions provided and reviewed. -Rx for Cortisporin sent to pharmacy. -Advised on signs and symptoms of infection developing.  We discussed that the phenol likely will create some redness and edema and tenderness around the nailbed as long as it is localized this is to be  expected.  Will return as needed if any infection signs develop  Procedure: Excision of Ingrown Toenail Location: Left 1st toe medial and lateral nail borders. Anesthesia: Lidocaine 1% plain; 1.5 mL and Marcaine  0.5% plain; 1.5 mL, digital block. Skin Prep: Betadine. Dressing: Silvadene; telfa; dry, sterile, compression dressing. Technique: Following skin prep, the toe was exsanguinated and a tourniquet was secured at the base of the toe. The affected nail border was freed, split with a nail splitter, and excised. Chemical matrixectomy was then performed with phenol and irrigated out with alcohol. The tourniquet was then removed and sterile dressing applied. Disposition: Patient tolerated procedure well.    Return in 7 weeks (on 12/23/2024) for at risk diabetic foot care. 1

## 2024-11-09 ENCOUNTER — Other Ambulatory Visit: Payer: Self-pay | Admitting: Internal Medicine

## 2024-11-11 ENCOUNTER — Telehealth: Payer: Self-pay

## 2024-11-11 NOTE — Telephone Encounter (Signed)
 Copied from CRM #[id number]. Topic: Clinical - Medical Advice >> Nov 11, 2024  3:38 PM Isabell A wrote: Reason for CRM: Patient would like Dr.Young to recommend a new provider for her - advised there are several different providers she can chose from, would like Dr.Young to refer her instead.   Callback number: [phone]  Dr. Neysa, please advise.

## 2024-11-13 NOTE — Telephone Encounter (Signed)
 Suggest Dr Zaida or Dr Pleas

## 2024-11-17 NOTE — Telephone Encounter (Signed)
 Called patient.  Made OV with Dr. Pleas for 03/06/2025 at 11:30 am.

## 2024-11-18 ENCOUNTER — Ambulatory Visit: Admitting: Internal Medicine

## 2024-11-18 ENCOUNTER — Encounter: Payer: Self-pay | Admitting: Internal Medicine

## 2024-11-18 VITALS — BP 120/70 | HR 68 | Temp 98.2°F | Ht 61.5 in | Wt 151.4 lb

## 2024-11-18 DIAGNOSIS — H353130 Nonexudative age-related macular degeneration, bilateral, stage unspecified: Secondary | ICD-10-CM

## 2024-11-18 DIAGNOSIS — J4520 Mild intermittent asthma, uncomplicated: Secondary | ICD-10-CM

## 2024-11-18 DIAGNOSIS — E1169 Type 2 diabetes mellitus with other specified complication: Secondary | ICD-10-CM

## 2024-11-18 DIAGNOSIS — I10 Essential (primary) hypertension: Secondary | ICD-10-CM

## 2024-11-18 DIAGNOSIS — H3530 Unspecified macular degeneration: Secondary | ICD-10-CM | POA: Insufficient documentation

## 2024-11-18 LAB — POCT GLYCOSYLATED HEMOGLOBIN (HGB A1C): Hemoglobin A1C: 7.1 % — AB (ref 4.0–5.6)

## 2024-11-18 MED ORDER — MELOXICAM 15 MG PO TABS: ORAL_TABLET | ORAL | 3 refills | Status: AC

## 2024-11-18 MED ORDER — MELOXICAM 15 MG PO TABS: ORAL_TABLET | ORAL | 3 refills | Status: DC

## 2024-11-18 MED ORDER — ROSUVASTATIN CALCIUM 20 MG PO TABS: 20.0000 mg | ORAL_TABLET | Freq: Every day | ORAL | 3 refills | Status: AC

## 2024-11-18 NOTE — Assessment & Plan Note (Signed)
 POC A1c is 7.1 today, indicating well-managed diabetes. The goal is to maintain A1c under 8, ideally around 7.5. Continue current diabetes management and monitor blood sugar if symptomatic only. Follow up 6 months. On statin.

## 2024-11-18 NOTE — Assessment & Plan Note (Signed)
 No flare today but recent z-pack needed. Continue symbicort  scheduled and duoneb and albuterol  prn.

## 2024-11-18 NOTE — Patient Instructions (Signed)
 Your HgA1c today is 7.1

## 2024-11-18 NOTE — Progress Notes (Signed)
   Subjective:   Patient ID: Amber Roman, female    DOB: [birth year], 78 y.o.   MRN: [id number]  Discussed the use of AI scribe software for clinical note transcription with the patient, who gave verbal consent to proceed.  History of Present Illness Amber Roman is an 78 year old female with type 2 diabetes and asthma who presents for routine follow-up.  Her hemoglobin A1c is currently 7.1, which is stable compared to her previous result of 6.8. She enjoys pecan pie during Thanksgiving and practices moderation in her diet. She does not regularly monitor her blood glucose.  Her vision is gradually worsening due to degeneration, which poses challenges in daily life. She manages her transportation needs using the bus and train systems.  She experiences some chest tightness and pressure, particularly in areas with traffic and pollution. She recently required a Z-Pak approximately 2-3 weeks ago for respiratory symptoms, which resolved quickly. She does have asthma but no worsening symptoms in the last 1-2 weeks.  Review of Systems  Constitutional: Negative.   HENT: Negative.    Eyes: Negative.   Respiratory:  Negative for cough, chest tightness and shortness of breath.   Cardiovascular:  Negative for chest pain, palpitations and leg swelling.  Gastrointestinal:  Negative for abdominal distention, abdominal pain, constipation, diarrhea, nausea and vomiting.  Musculoskeletal: Negative.   Skin: Negative.   Neurological: Negative.   Psychiatric/Behavioral: Negative.      Objective:  Physical Exam Constitutional:      Appearance: She is well-developed.  HENT:     Head: Normocephalic and atraumatic.  Cardiovascular:     Rate and Rhythm: Normal rate and regular rhythm.  Pulmonary:     Effort: Pulmonary effort is normal. No respiratory distress.     Breath sounds: Normal breath sounds. No wheezing or rales.  Abdominal:     General: Bowel sounds are normal. There is no distension.      Palpations: Abdomen is soft.     Tenderness: There is no abdominal tenderness.  Musculoskeletal:     Cervical back: Normal range of motion.  Skin:    General: Skin is warm and dry.  Neurological:     Mental Status: She is alert and oriented to person, place, and time.     Coordination: Coordination normal.     Vitals:   11/18/24 0842  BP: 120/70  Pulse: 68  Temp: 98.2 F (36.8 C)  TempSrc: Oral  SpO2: 97%  Weight: 151 lb 6.4 oz (68.7 kg)  Height: 5' 1.5 (1.562 m)    Assessment and Plan Assessment & Plan Type 2 diabetes mellitus without complications   POC A1c is 7.1 today, indicating well-managed diabetes. The goal is to maintain A1c under 8, ideally around 7.5. Continue current diabetes management and monitor blood sugar if symptomatic only. Follow up 6 months. On statin.  Nonexudative age-related macular degeneration   Vision is worsening due to macular degeneration, but daily activities are managed. Continue current management and monitor vision changes.  Mild intermittent asthma, uncomplicated   Asthma is well-controlled. A recent Z-Pak was effective for a respiratory issue. Environmental factors may affect symptoms.

## 2024-11-18 NOTE — Assessment & Plan Note (Signed)
 BP at goal and recent labs stable. Continue regimen.

## 2024-11-18 NOTE — Assessment & Plan Note (Signed)
 Is impacting vision and she cannot drive any longer. She is using coping strategies.

## 2024-12-08 ENCOUNTER — Other Ambulatory Visit: Payer: Self-pay | Admitting: Internal Medicine

## 2024-12-23 ENCOUNTER — Ambulatory Visit: Admitting: Podiatry

## 2024-12-23 VITALS — Ht 61.5 in | Wt 151.0 lb

## 2024-12-23 DIAGNOSIS — M79675 Pain in left toe(s): Secondary | ICD-10-CM

## 2024-12-23 DIAGNOSIS — M79674 Pain in right toe(s): Secondary | ICD-10-CM

## 2024-12-23 DIAGNOSIS — L600 Ingrowing nail: Secondary | ICD-10-CM | POA: Diagnosis not present

## 2024-12-23 DIAGNOSIS — B351 Tinea unguium: Secondary | ICD-10-CM | POA: Diagnosis not present

## 2024-12-23 NOTE — Progress Notes (Signed)
"  °  Subjective:  Patient ID: Amber Roman, female    DOB: 11/02/1946,  MRN: [id number]  Chief Complaint  Patient presents with   Diabetes    RM 1 DFC/ Nail recheck from previous ingrown procedure.    79 y.o. female presents with the above complaint. History confirmed with patient.  She is doing well the ingrown nail is doing better  Objective:  Physical Exam: warm, good capillary refill, no trophic changes or ulcerative lesions, normal DP and PT pulses, and normal sensory exam. Left Foot: dystrophic yellowed discolored nail plates with subungual debris,  Right Foot: dystrophic yellowed discolored nail plates with subungual debris  Assessment:   1. Pain due to onychomycosis of toenails of both feet   2. Ingrowing left great toenail      Plan:  Patient was evaluated and treated and all questions answered.   Discussed the etiology and treatment options for the condition in detail with the patient. Recommended debridement of the nails today. Sharp and mechanical debridement performed of all painful and mycotic nails today. Nails debrided in length and thickness using a nail nipper to level of comfort. Discussed treatment options including appropriate shoe gear. Follow up as needed for painful nails.     Ingrown Nail, left - Nail matricectomy site is healing well I debrided some of the scar tissue and hyperkeratosis should continue to improve return as needed for this if it returns or worsens.   Return in about 9 weeks (around 02/24/2025) for [facility]. 1 "

## 2025-01-21 ENCOUNTER — Other Ambulatory Visit

## 2025-02-24 ENCOUNTER — Ambulatory Visit: Admitting: Podiatry

## 2025-03-06 ENCOUNTER — Ambulatory Visit

## 2025-04-01 ENCOUNTER — Ambulatory Visit (HOSPITAL_COMMUNITY)

## 2025-04-21 ENCOUNTER — Ambulatory Visit: Admitting: Internal Medicine

## 2025-05-20 ENCOUNTER — Encounter: Admitting: Internal Medicine

## 2025-05-20 ENCOUNTER — Ambulatory Visit

## 2025-10-23 ENCOUNTER — Ambulatory Visit: Admitting: Behavioral Health
